# Patient Record
Sex: Female | Born: 1975 | Race: White | Hispanic: No | Marital: Single | Smoking: Former smoker
Health system: Southern US, Community
[De-identification: ages and names within clinical notes are randomized; demographics above are authoritative.]

## PROBLEM LIST (undated history)

## (undated) ENCOUNTER — Ambulatory Visit (HOSPITAL_COMMUNITY): Disposition: A | Discharge status: Home / Self Care | Payer: Medicaid Other

## (undated) DIAGNOSIS — J449 Chronic obstructive pulmonary disease, unspecified: Secondary | ICD-10-CM

## (undated) DIAGNOSIS — D509 Iron deficiency anemia, unspecified: Secondary | ICD-10-CM

## (undated) DIAGNOSIS — M3500 Sicca syndrome, unspecified: Secondary | ICD-10-CM

## (undated) DIAGNOSIS — G5600 Carpal tunnel syndrome, unspecified upper limb: Secondary | ICD-10-CM

## (undated) DIAGNOSIS — K649 Unspecified hemorrhoids: Secondary | ICD-10-CM

## (undated) DIAGNOSIS — D069 Carcinoma in situ of cervix, unspecified: Secondary | ICD-10-CM

## (undated) DIAGNOSIS — J45909 Unspecified asthma, uncomplicated: Secondary | ICD-10-CM

## (undated) HISTORY — PX: OTHER SURGICAL HISTORY: SHX169

## (undated) HISTORY — DX: Sjogren syndrome, unspecified: M35.00

## (undated) HISTORY — DX: Unspecified hemorrhoids: K64.9

## (undated) HISTORY — DX: Chronic obstructive pulmonary disease, unspecified: J44.9

## (undated) HISTORY — DX: Carpal tunnel syndrome, unspecified upper limb: G56.00

## (undated) HISTORY — DX: Iron deficiency anemia, unspecified: D50.9

---
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
## 1999-02-20 ENCOUNTER — Emergency Department (HOSPITAL_COMMUNITY): Admission: EM | Admit: 1999-02-20 | Discharge: 1999-02-20 | Payer: Self-pay | Admitting: Emergency Medicine

## 1999-08-30 ENCOUNTER — Encounter: Payer: Self-pay | Admitting: Emergency Medicine

## 1999-08-30 ENCOUNTER — Emergency Department (HOSPITAL_COMMUNITY): Admission: EM | Admit: 1999-08-30 | Discharge: 1999-08-30 | Payer: Self-pay | Admitting: Emergency Medicine

## 1999-09-14 ENCOUNTER — Encounter: Admission: RE | Admit: 1999-09-14 | Discharge: 1999-09-14 | Payer: Self-pay | Admitting: Family Medicine

## 1999-09-23 ENCOUNTER — Encounter: Admission: RE | Admit: 1999-09-23 | Discharge: 1999-09-23 | Payer: Self-pay | Admitting: Family Medicine

## 1999-09-26 ENCOUNTER — Ambulatory Visit (HOSPITAL_COMMUNITY): Admission: AD | Admit: 1999-09-26 | Discharge: 1999-09-26 | Payer: Self-pay | Admitting: Obstetrics & Gynecology

## 1999-09-26 ENCOUNTER — Encounter: Payer: Self-pay | Admitting: Obstetrics & Gynecology

## 1999-09-26 ENCOUNTER — Encounter (INDEPENDENT_AMBULATORY_CARE_PROVIDER_SITE_OTHER): Payer: Self-pay | Admitting: Specialist

## 2000-02-28 ENCOUNTER — Emergency Department (HOSPITAL_COMMUNITY): Admission: EM | Admit: 2000-02-28 | Discharge: 2000-02-28 | Payer: Self-pay | Admitting: Emergency Medicine

## 2000-05-26 ENCOUNTER — Inpatient Hospital Stay (HOSPITAL_COMMUNITY): Admission: AD | Admit: 2000-05-26 | Discharge: 2000-05-26 | Payer: Self-pay | Admitting: Obstetrics & Gynecology

## 2000-06-02 ENCOUNTER — Ambulatory Visit (HOSPITAL_COMMUNITY): Admission: RE | Admit: 2000-06-02 | Discharge: 2000-06-02 | Payer: Self-pay | Admitting: *Deleted

## 2000-07-05 ENCOUNTER — Encounter: Admission: RE | Admit: 2000-07-05 | Discharge: 2000-07-05 | Payer: Self-pay | Admitting: Obstetrics & Gynecology

## 2000-07-19 ENCOUNTER — Encounter: Admission: RE | Admit: 2000-07-19 | Discharge: 2000-07-19 | Payer: Self-pay | Admitting: Obstetrics & Gynecology

## 2000-08-02 ENCOUNTER — Encounter: Admission: RE | Admit: 2000-08-02 | Discharge: 2000-08-02 | Payer: Self-pay | Admitting: Obstetrics & Gynecology

## 2000-08-16 ENCOUNTER — Encounter: Admission: RE | Admit: 2000-08-16 | Discharge: 2000-08-16 | Payer: Self-pay | Admitting: Obstetrics & Gynecology

## 2000-08-30 ENCOUNTER — Encounter: Admission: RE | Admit: 2000-08-30 | Discharge: 2000-08-30 | Payer: Self-pay | Admitting: Obstetrics & Gynecology

## 2000-09-11 ENCOUNTER — Ambulatory Visit (HOSPITAL_COMMUNITY): Admission: RE | Admit: 2000-09-11 | Discharge: 2000-09-11 | Payer: Self-pay | Admitting: Obstetrics & Gynecology

## 2000-09-13 ENCOUNTER — Encounter: Admission: RE | Admit: 2000-09-13 | Discharge: 2000-09-13 | Payer: Self-pay | Admitting: Obstetrics & Gynecology

## 2000-09-27 ENCOUNTER — Encounter: Admission: RE | Admit: 2000-09-27 | Discharge: 2000-09-27 | Payer: Self-pay | Admitting: Obstetrics & Gynecology

## 2000-10-11 ENCOUNTER — Encounter: Admission: RE | Admit: 2000-10-11 | Discharge: 2000-10-11 | Payer: Self-pay | Admitting: Obstetrics & Gynecology

## 2000-10-11 ENCOUNTER — Inpatient Hospital Stay (HOSPITAL_COMMUNITY): Admission: AD | Admit: 2000-10-11 | Discharge: 2000-10-11 | Payer: Self-pay | Admitting: Obstetrics

## 2000-10-18 ENCOUNTER — Encounter: Admission: RE | Admit: 2000-10-18 | Discharge: 2000-10-18 | Payer: Self-pay | Admitting: Obstetrics & Gynecology

## 2000-10-25 ENCOUNTER — Encounter: Admission: RE | Admit: 2000-10-25 | Discharge: 2000-10-25 | Payer: Self-pay | Admitting: Obstetrics & Gynecology

## 2000-11-01 ENCOUNTER — Encounter: Admission: RE | Admit: 2000-11-01 | Discharge: 2000-11-01 | Payer: Self-pay | Admitting: Obstetrics & Gynecology

## 2000-11-15 ENCOUNTER — Encounter: Admission: RE | Admit: 2000-11-15 | Discharge: 2000-11-15 | Payer: Self-pay | Admitting: Obstetrics & Gynecology

## 2000-11-20 ENCOUNTER — Encounter (INDEPENDENT_AMBULATORY_CARE_PROVIDER_SITE_OTHER): Payer: Self-pay | Admitting: Specialist

## 2000-11-20 ENCOUNTER — Encounter (HOSPITAL_COMMUNITY): Admission: RE | Admit: 2000-11-20 | Discharge: 2000-11-21 | Payer: Self-pay | Admitting: Obstetrics & Gynecology

## 2000-11-20 ENCOUNTER — Inpatient Hospital Stay (HOSPITAL_COMMUNITY): Admission: AD | Admit: 2000-11-20 | Discharge: 2000-11-23 | Payer: Self-pay | Admitting: *Deleted

## 2001-01-16 ENCOUNTER — Emergency Department (HOSPITAL_COMMUNITY): Admission: EM | Admit: 2001-01-16 | Discharge: 2001-01-17 | Payer: Self-pay | Admitting: Emergency Medicine

## 2001-01-16 ENCOUNTER — Encounter: Payer: Self-pay | Admitting: Emergency Medicine

## 2001-04-14 ENCOUNTER — Emergency Department (HOSPITAL_COMMUNITY): Admission: EM | Admit: 2001-04-14 | Discharge: 2001-04-14 | Payer: Self-pay | Admitting: Emergency Medicine

## 2001-04-30 ENCOUNTER — Emergency Department (HOSPITAL_COMMUNITY): Admission: EM | Admit: 2001-04-30 | Discharge: 2001-04-30 | Payer: Self-pay | Admitting: Emergency Medicine

## 2002-03-06 ENCOUNTER — Emergency Department (HOSPITAL_COMMUNITY): Admission: EM | Admit: 2002-03-06 | Discharge: 2002-03-06 | Payer: Self-pay | Admitting: Emergency Medicine

## 2002-05-12 ENCOUNTER — Encounter (INDEPENDENT_AMBULATORY_CARE_PROVIDER_SITE_OTHER): Payer: Self-pay | Admitting: Specialist

## 2002-05-12 ENCOUNTER — Inpatient Hospital Stay (HOSPITAL_COMMUNITY): Admission: AD | Admit: 2002-05-12 | Discharge: 2002-05-14 | Payer: Self-pay | Admitting: *Deleted

## 2002-05-12 ENCOUNTER — Encounter: Payer: Self-pay | Admitting: *Deleted

## 2002-05-28 ENCOUNTER — Emergency Department (HOSPITAL_COMMUNITY): Admission: EM | Admit: 2002-05-28 | Discharge: 2002-05-28 | Payer: Self-pay | Admitting: Emergency Medicine

## 2004-02-16 ENCOUNTER — Inpatient Hospital Stay (HOSPITAL_COMMUNITY): Admission: AD | Admit: 2004-02-16 | Discharge: 2004-02-16 | Payer: Self-pay | Admitting: Obstetrics & Gynecology

## 2004-05-14 ENCOUNTER — Inpatient Hospital Stay (HOSPITAL_COMMUNITY): Admission: AD | Admit: 2004-05-14 | Discharge: 2004-05-16 | Payer: Self-pay | Admitting: *Deleted

## 2004-06-03 ENCOUNTER — Ambulatory Visit: Payer: Self-pay | Admitting: *Deleted

## 2004-06-03 ENCOUNTER — Encounter: Admission: RE | Admit: 2004-06-03 | Discharge: 2004-06-03 | Payer: Self-pay | Admitting: Family Medicine

## 2004-06-07 ENCOUNTER — Ambulatory Visit (HOSPITAL_COMMUNITY): Admission: RE | Admit: 2004-06-07 | Discharge: 2004-06-07 | Payer: Self-pay | Admitting: Family Medicine

## 2004-06-09 ENCOUNTER — Encounter: Admission: RE | Admit: 2004-06-09 | Discharge: 2004-06-09 | Payer: Self-pay | Admitting: *Deleted

## 2004-06-11 ENCOUNTER — Encounter: Admission: RE | Admit: 2004-06-11 | Discharge: 2004-06-11 | Payer: Self-pay | Admitting: *Deleted

## 2004-06-16 ENCOUNTER — Ambulatory Visit: Payer: Self-pay | Admitting: *Deleted

## 2004-06-16 ENCOUNTER — Encounter: Admission: RE | Admit: 2004-06-16 | Discharge: 2004-06-16 | Payer: Self-pay | Admitting: *Deleted

## 2004-06-16 ENCOUNTER — Inpatient Hospital Stay (HOSPITAL_COMMUNITY): Admission: RE | Admit: 2004-06-16 | Discharge: 2004-06-20 | Payer: Self-pay | Admitting: *Deleted

## 2004-06-17 ENCOUNTER — Encounter (INDEPENDENT_AMBULATORY_CARE_PROVIDER_SITE_OTHER): Payer: Self-pay | Admitting: Specialist

## 2004-06-24 ENCOUNTER — Inpatient Hospital Stay (HOSPITAL_COMMUNITY): Admission: AD | Admit: 2004-06-24 | Discharge: 2004-06-24 | Payer: Self-pay | Admitting: *Deleted

## 2004-10-12 ENCOUNTER — Emergency Department (HOSPITAL_COMMUNITY): Admission: EM | Admit: 2004-10-12 | Discharge: 2004-10-13 | Payer: Self-pay | Admitting: *Deleted

## 2008-05-01 ENCOUNTER — Emergency Department (HOSPITAL_COMMUNITY): Admission: EM | Admit: 2008-05-01 | Discharge: 2008-05-01 | Payer: Self-pay | Admitting: Emergency Medicine

## 2010-06-22 ENCOUNTER — Emergency Department (HOSPITAL_COMMUNITY): Admission: EM | Admit: 2010-06-22 | Discharge: 2010-06-23 | Payer: Self-pay | Admitting: Emergency Medicine

## 2010-08-08 ENCOUNTER — Ambulatory Visit: Payer: Self-pay | Admitting: Gynecology

## 2010-08-08 ENCOUNTER — Inpatient Hospital Stay (HOSPITAL_COMMUNITY): Admission: AD | Admit: 2010-08-08 | Discharge: 2010-08-08 | Payer: Self-pay | Admitting: Obstetrics and Gynecology

## 2010-11-07 ENCOUNTER — Encounter: Payer: Self-pay | Admitting: *Deleted

## 2010-12-29 LAB — URINE MICROSCOPIC-ADD ON

## 2010-12-29 LAB — GC/CHLAMYDIA PROBE AMP, GENITAL
Chlamydia, DNA Probe: NEGATIVE
GC Probe Amp, Genital: NEGATIVE

## 2010-12-29 LAB — URINALYSIS, ROUTINE W REFLEX MICROSCOPIC
Bilirubin Urine: NEGATIVE
Glucose, UA: NEGATIVE mg/dL
Ketones, ur: NEGATIVE mg/dL
Leukocytes, UA: NEGATIVE
Nitrite: NEGATIVE
Protein, ur: NEGATIVE mg/dL
Specific Gravity, Urine: 1.025 (ref 1.005–1.030)
Urobilinogen, UA: 1 mg/dL (ref 0.0–1.0)
pH: 5.5 (ref 5.0–8.0)

## 2010-12-29 LAB — HERPES SIMPLEX VIRUS CULTURE: Culture: NOT DETECTED

## 2010-12-29 LAB — WET PREP, GENITAL
Clue Cells Wet Prep HPF POC: NONE SEEN
Trich, Wet Prep: NONE SEEN
Yeast Wet Prep HPF POC: NONE SEEN

## 2010-12-29 LAB — POCT PREGNANCY, URINE: Preg Test, Ur: NEGATIVE

## 2011-03-04 NOTE — Discharge Summary (Signed)
[name], [name]                            ACCOUNT NO.:  [id number]   MEDICAL RECORD NO.:  [id number]                   PATIENT TYPE:  INP   LOCATION:  9118                                 [facility]ILITY[facility]   PHYSICIAN:  Conni Elliot, M.D.             DATE OF BIRTH:  1975/12/26   DATE OF ADMISSION:  06/16/2004  DATE OF DISCHARGE:  06/20/2004                                 DISCHARGE SUMMARY   ADMISSION DIAGNOSES:  Abnormal fetal Doppler's with scheduled cesarean  section.   DISCHARGE DIAGNOSES:  1.  Status post low transverse cesarean section with bilateral tubal      ligation with delivery of healthy female infant.   HISTORY OF PRESENT ILLNESS:  This is a 35 year old gravida 4, para 1, 1-1-1  who was admitted at 36 weeks and 3 days gestation by 19 week ultrasound for  a scheduled cesarean section due to abnormal Doppler's. 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   HOSPITAL COURSE:  She was admitted for scheduled cesarean section and  underwent a low transverse Cesarean section on June 17, 2004 without  significant complications with delivery of a healthy female infant with  Apgar's of 9 and 9 and a cord pH of 7.3. Infant's weight was 4 pound 5  ounces. Her postoperative course has been unremarkable. Her pain has been  adequately controlled with oral medications and she will not be requiring  contraception following her tubal ligation. She will be both breast and  bottle feeding. She is to return to have her staples removed on  postoperative 5 to 7 secondary to obesity.   DISPOSITION:  She is discharged to home on postoperative day 3 to followup  at the clinic.   PROCEDURE:  1.  Low transverse cesarean section.  2.  Bilateral tubal ligation, both done by Dr. Gavin Potters. For full details see      operative note.   LABORATORY DATA:  Postpartum hemoglobin is 9.8 with  hematocrit of 29 and a  platelet count of 115,000.   DISCHARGE INSTRUCTIONS:  1.  She is discharged home.  2.  She is to return to have her staples removed in 3 to 4 days.  3.  She is to eat a healthy diet.  4.  She is to be active as she is able.  5.  She is to care for her incision per instructions given to her at      discharge.   DISCHARGE MEDICATIONS:  1.  Percocet 1 to 2 tablets every 4 to 6 hours as needed.  2.  Ibuprofen q. 6 hours as needed.  3.  Prenatal vitamins 1 tablet daily while breast feeding.  4.  Iron sulfate 325 mg twice daily with meals.     Obstetrics Resident  Conni Elliot, M.D.    OR/MEDQ  D:  06/20/2004  T:  06/21/2004  Job:  [id number]

## 2011-03-04 NOTE — Discharge Summary (Signed)
[name], [name]                            ACCOUNT NO.:  [id number]   MEDICAL RECORD NO.:  [id number]                   PATIENT TYPE:  INP   LOCATION:  9140                                 [facility]   PHYSICIAN:  [name] DICTATOR                    DATE OF BIRTH:  [birth year]   DATE OF ADMISSION:  05/12/2002  DATE OF DISCHARGE:  05/14/2002                                 DISCHARGE SUMMARY   ADMISSION DIAGNOSIS:  A 35 year old G3, P1-0-1-1 at 31-1/7 weeks with  abdominal pain and no fetal movement.   DISCHARGE DIAGNOSIS:  A 35 year old G3, P1-1-1-1, status post intrauterine  fetal death of undetermined cause, status post vaginal delivery.   HISTORY:  The patient is a 35 year old G3, P1 at 31-0/7 weeks by a 23-week  ultrasound who reported having increasing abdominal pain and decreasing  fetal movement for five days.  The patient's pain became acutely worse that  morning and presented to MAU and was found to have an IUFD by ultrasound and  a tender uterus.   PAST OBSTETRIC HISTORY:  Significant for a spontaneous abortion at 14 weeks  followed by a term delivery of a female infant by C-section for a breech  position.  At this previous delivery, she was noted to have a low platelet  count with what appears to be a negative workup which included ANA, APLA,  and von Willebrand.  She had the diagnosis of idiopathic/gestational  thrombocytopenia.  This patient had no other prenatal care for this visit  with the exception of an ultrasound earlier in the month at the pregnancy  crisis center.   HOSPITAL COURSE:  The patient was admitted for induction of labor with low-  dose Pitocin.  She received an epidural for anesthesia.  She received triple  antibiotics for elevated white blood count and temperature and a tender  uterus.  The patient initially had elevated blood pressures at admission,  which then went to normal shortly after admission and stayed normal.   On May 13, 2002,  the patient delivered without assistance into the bed a  nonviable female infant with Apgar's of 0 at 1 minute and 0 at 9 minutes.  Placenta was delivered spontaneously intact with a three-vessel cord.  Cultures of the placenta were taken and infant went to pathology for  autopsy.  There were no external defects noted.  The patient was inspected  and no lacerations were noted.   The patient's platelet count at admission was 100.  On the 24-hour  postpartum CBC, her platelet count was 68.  Twelve hours later, it was 72.   Urine drug screen was negative.  Cultures taken prior to antibiotic:  GBS  negative, GC and Chlamydia negative.  Wet prep within normal limits.  At  discharge, a cardiolipin antibody, antinuclear antibody, antiplatelet  antibody are all pending, as well a rubella  screen is pending.   At discharge, the patient was feeling well.  There were no signs of bleeding  with the exception of minimal lochia.  She appeared to be appropriate in  dealing with the loss of this infant and was making preparations for a  funeral to be the next day.   The patient was given Depo-Provera 500 mg IM prior to discharge.   DISCHARGE INSTRUCTIONS:  The patient is discharged to home with a  prescription for ibuprofen 600 mg one every six hours.  She has prenatal  vitamins and was instructed to continue taking one for six weeks.  She is to  follow up with [name] on June 11, 2002 at 10 o'clock a.m.  She is to  follow up at [facility] in six weeks for routine postpartum check.   DIET/ACTIVITY/SEXUAL ACTIVITY:  Per routine.                                                  [name] DICTATOR    DD/MEDQ  D:  05/14/2002  T:  05/18/2002  Job:  [id number]

## 2011-03-04 NOTE — Discharge Summary (Signed)
NAMELIZA, CZERWINSKI                            ACCOUNT NO.:  [id number]   MEDICAL RECORD NO.:  [id number]                   PATIENT TYPE:  INP   LOCATION:  9158                                 [facility]   PHYSICIAN:  Conni Elliot, M.D.             DATE OF BIRTH:  Oct 05, 1976   DATE OF ADMISSION:  05/14/2004  DATE OF DISCHARGE:  05/16/2004                                 DISCHARGE SUMMARY   ADMISSION DIAGNOSES:  46. 35 year old G3,P1,1,1,1 at 31 weeks 6 days.  2. Threatened preterm labor.   DISCHARGE DIAGNOSES:  10. 35 year old G3,P1,1,1,1 at 32 weeks 1 day.  2. Cessation of preterm labor.   DISCHARGE MEDICATIONS:  1. Terbutaline 2.5 mg p.o. q.4h  2. ASA 81 mg daily.  3. Prenatal vitamins one tablet daily.  4. Colace 100 mg b.i.d.   STUDIES:  OB ultrasound:  Single fetus, 147 beats/minute, breech  presentation, no placenta previa, AFI 15.4, cervix 3.4 translabially.   ADMISSION HISTORY:  Ms. Brunetta Genera is a 35 year old G3,P1,1,1,1 that presented at  31 weeks, 6 days to the MAU complaining of abdominal pain for one day that  began that morning.  Questionable leakage of fluid versus urine for two  days.   HOSPITAL COURSE:  Patient was seen in MAU for evaluation.  Patient's vital  signs on admission to MAU:  Temperature 97.8, pulse 80, respiration rate 20,  blood pressure 129/77.  Speculum exam:  White discharge.  No pooling.  Nitrazine negative.  Digital cervical exam:  Fingertip:  Soft and high with  developed lower uterine segment.  Patient was put on monitor and FHTs were  120-130. Toco revealed uterine contraction every 3-4 minutes, but reduced to  uterine irritability. The patient was admitted to floor for observation and  studies due to patient's significant history of spontaneous abortion.  The  did fine on unit.  Continued having uterine irritability and was placed on  Terbutaline 2.5 mg p.o. q.4h which resolved contractions and irritability.  Fetal heart tones  remained reassuring.  The patient was evaluated for lupus  anticoagulant, antinuclear antibody cardiolipin antigens which are all  pending.  The patient will be discharge on Terbutaline p.o. 2.5 mg q.4h and  ASA 81 mg p.o. daily until pending results are finalized.  The patient will  also be discharged on Colace 100 mg b.i.d. and a prenatal vitamin.  The  patient's cervix was unchanged at discharge.   CONDITION ON DISCHARGE:  Stable.   INSTRUCTIONS GIVEN TO PATIENT:  The patient will remain on bed rest while at  home.  She understands her medication protocol.  [facility] bu she will also be instructed  to call the high risk clinic to set an appointment for this Thursday or  Wednesday.     Bonnita Hollow, M.D.  Conni Elliot, M.D.    VRE/MEDQ  D:  05/16/2004  T:  05/16/2004  Job:  [id number]

## 2011-03-04 NOTE — Op Note (Signed)
[facility]  Patient:    [name], [name]                         MRN: [id number] Proc. Date: 11/20/00 Adm. Date:  [id number] Disc. Date: [id number] Attending:  Antionette Char                           Operative Report  PREOPERATIVE DIAGNOSIS:       Term pregnancy, breech presentation.  POSTOPERATIVE DIAGNOSIS:      Term pregnancy, breech presentation.  PROCEDURE:                    Primary low transverse cesarean section.  SURGEON:                      Charles A. Clearance Coots, M.D.  ASSISTANT:                    Jamey Reas, M.D.                               Carrie Thornon.  ANESTHESIA:                   Spinal.  ESTIMATED BLOOD LOSS:         800 ml.  IV FLUIDS:                    1600 ml.  URINE OUTPUT:                 200 ml clear.  COMPLICATIONS:                None.  DRAINS:                       Foley to gravity.  FINDINGS:                     Viable female at 49 with Apgars of 8 at one minute and 9 at five minutes, weight of 7 pounds 1 ounce. Normal uterus, ovaries, and fallopian tubes.  DESCRIPTION OF PROCEDURE:     The patient was brought to the operating room. After satisfactory spinal anesthesia, the abdomen was prepped and draped in the usual sterile fashion. A Pfannenstiel skin incision was made with the scalpel that was deepened down to the fascia with scalpel. The fascia was nicked in the midline and the fascial incision was extended to the left and to the right with curved Mayo scissors. The superior and inferior fascial edges were taken off of the rectus muscle, both blunt and sharp dissection. The rectus muscle was then divided in the midline superiorly and inferiorly, being careful to avoid the urinary bladder. The peritoneum was then entered digitally and was digitally extended to the left and to the right. The bladder blade was positioned and the vesicouterine fold of peritoneum above the reflection of the urinary  bladder was grasped with forceps and was incised and undermined with Metzenbaum scissors. The incision was extended to the left and to the right with the Metzenbaum scissors. The bladder flap was then bluntly developed and the bladder blade was repositioned in front of the urinary bladder, placing it well out of the operative field. The uterus was then entered with short strokes of  the scalpel down to the amniotic sac. The uterine incision was then extended to the left and to the right with the bandage scissors. The amniotic sac was ruptured and a moderate amount of clear fluid was expelled. The delivery was then accomplished as a frank breech delivery in routine fashion without complications. The infants mouth and nose were suctioned with a suction bulb and the umbilical cord was doubly clamped and cut and the infant was handed off to the nursery staff. Placental cord blood was obtained and the placenta was spontaneously expelled from the uterine cavity intact. The uterus was exteriorized and the edges of the uterine incision were grasped with a ring forceps. The endometrial surface was thoroughly debrided with a dry lap sponge. The uterus was closed with a continuous interlocking suture of 0 Monocryl from each corner to the center. Hemostasis was excellent. The uterus was placed back in its normal anatomic position and the pelvic cavity was thoroughly irrigated with warm saline solution and all clots were removed. The abdomen was then closed as follows: The fascia was closed with a continuous suture of 0 PDS from each corner to the center. The subcutaneous tissue was thoroughly irrigated with warm saline solution and all areas of subcutaneous bleeding were coagulated with the Bovie. The skin was then approximated with surgical stainless steel staples. A sterile bandage was applied to the incision closure. The surgical technician indicated that all needle, sponge, and instrument counts were  correct. The patient tolerated the procedure well and was transported to recovery room ins satisfactory condition. DD:  11/20/00 TD:  11/21/00 Job: [id number]

## 2011-03-04 NOTE — Op Note (Signed)
NAMEDAN, DISSINGER                            ACCOUNT NO.:  [id number]   MEDICAL RECORD NO.:  [id number]                   PATIENT TYPE:  INP   LOCATION:  9118                                 [facility]   PHYSICIAN:  Conni Elliot, M.D.             DATE OF BIRTH:  [birth year]   DATE OF PROCEDURE:  06/17/2004  DATE OF DISCHARGE:                                 OPERATIVE REPORT   PREOPERATIVE DIAGNOSES:  1.  Thirty-six and three-sevenths intrauterine pregnancy.  2.  Abnormal fetal Dopplers.  3.  Anticardiolipin disorder.  4.  Breech presentation.  5.  Desired sterilization.   POSTOPERATIVE DIAGNOSES:  1.  Thirty-six and three-sevenths intrauterine pregnancy.  2.  Abnormal fetal Dopplers.  3.  Anticardiolipin disorder.  4.  Breech presentation.  5.  Desired sterilization.  6.  Preperitoneal omental adhesions.   PROCEDURE:  Lower transverse C-section, bilateral tubal ligation.   SURGEON:  Dr. Corky Sox.   ASSISTANT:  Dr. Misty Stanley Cassidy-Vu.   ESTIMATED BLOOD LOSS:  800 mL   INTRAVENOUS FLUIDS:  3000 mL   URINE OUTPUT:  525 mL   INDICATIONS:  Ms. Brunetta Genera is a 35 year old G4 P1-1-1-1 at 76 and 3 weeks with  positive anticardiolipin antibody, positive lupus anticoagulant, with a  small-for-gestational-age baby with abnormal middle cerebral artery and  abnormal umbilical artery Dopplers.   FINDINGS:  Viable female infant with Apgars 9 at one minute and 9 at five  minutes.  She was in the breech presentation with sacrum anterior.  There  was clear amniotic fluid.  Weight was 4 pounds 5 ounces.  Cord pH 7.3.  Normal uterus, tubes, and ovaries.   DESCRIPTION OF PROCEDURE:  The patient was taken to the operating room where  spinal anesthesia was found to be adequate.  She was prepped and draped in a  normal sterile fashion in the dorsal supine position.  A Pfannenstiel skin  incision was made with the scalpel and carried through to the underlying  layer of fascia.  The  fascia was incised in the midline and the incision was  extended laterally with Mayo scissors.  The superior aspect of the fascial  incision was grasped with Kochers and elevated.  At that point preperitoneal  omental adhesions were noted and tied off and cut.  The underlying rectus  muscles were dissected off.  Attention was turned to the inferior aspect of  the incision with the rectus muscles dissected off bluntly.  The rectus  muscles were separated in the midline and the peritoneum was identified,  tented up, and entered with Metzenbaum scissors.  The peritoneal incision  was extended superiorly and inferiorly with good visualization of the  bladder.  The bladder blade was inserted and the vesicouterine peritoneum  was identified, grasped with pickups, and entered sharply with Metzenbaum  scissors.  The incision was extended laterally and the bladder flap was  created  digitally.   The bladder blade was reinserted and the lower uterine segment was incised  in a transverse fashion with the scalpel.  The incision was extended  laterally manually.  The bladder blade was removed and the infant's breech  was delivered atraumatically.  The remainder of the body was delivered  atraumatically and the nose and mouth were bulb suctioned.  The cord was  clamped and cut.  The infant was handed off to the waiting NICU team in  attendance.  Cord gases and cord blood were sent.   The placenta was removed manually, the uterus exteriorized and cleared of  all clots and debris.  The uterine incision was repaired with chromic in a  running locked fashion.  A second layer of the same suture was used to  obtain hemostasis.  The bladder flap was repaired in a running stitch.   Attention was then turned to the tubes and ovaries.  The right fallopian  tube was identified and grasped with a Babcock clamp.  The tube was ligated  with suture and excised.  Good hemostasis was noted.  The fimbria were  easily  noted since the uterus was still exteriorized.  The left fallopian  tube was done in the same manner.  The remainder of the tubes and uterus  were returned to the abdomen.  The gutters were cleared of all clots.  The  perineum was closed in a running fashion with Vicryl.  The fascia was  reapproximated with Vicryl in a running fashion.  The subcuticular layer was  closed in a running fashion.  The skin was closed with staples.   The patient tolerated the procedure well.  Sponge, lap, and needle counts  were correct.  Ancef 2 g was given prior to the procedure.  The patient was  taken to the recovery room in stable condition.     Jon Gills, M.D.                     Conni Elliot, M.D.    LC/MEDQ  D:  06/17/2004  T:  06/18/2004  Job:  [id number]

## 2011-03-04 NOTE — Op Note (Signed)
NAME:  [name], [name]                            ACCOUNT NO.:  [id number]   MEDICAL RECORD NO.:  [id number]                   PATIENT TYPE:  WOC   LOCATION:  WOC                                  FACILITY:  [facility]   PHYSICIAN:  Conni Elliot, M.D.             DATE OF BIRTH:  [birth year]   DATE OF PROCEDURE:  DATE OF DISCHARGE:                                 OPERATIVE REPORT   ADDENDUM:  The following items were sent to pathology:  1.  Placenta.  2.  Right and left parts of the fallopian tubes.     Jon Gills, M.D.                     Conni Elliot, M.D.    LC/MEDQ  D:  06/17/2004  T:  06/18/2004  Job:  [id number]

## 2011-03-04 NOTE — Discharge Summary (Signed)
NAMECRYSTINA, BORRAYO                            ACCOUNT NO.:  [id number]   MEDICAL RECORD NO.:  [id number]                   PATIENT TYPE:  INP   LOCATION:  9158                                 [facility]   PHYSICIAN:  Conni Elliot, M.D.             DATE OF BIRTH:  May 23, 1976   DATE OF ADMISSION:  05/14/2004  DATE OF DISCHARGE:  05/16/2004                                 DISCHARGE SUMMARY   ADDENDUM:   STUDIES:  BAT positive, lupus anticoagulant pending, antinuclear antigen,  cardiolipin antibody pending.     Bonnita Hollow, M.D.               Conni Elliot, M.D.    VRE/MEDQ  D:  05/16/2004  T:  05/16/2004  Job:  [id number]

## 2011-03-04 NOTE — Discharge Summary (Signed)
[facility]  Patient:    [name], [name]                         MRN: [id number] Adm. Date:  [id number] Disc. Date: [id number] Attending:  Michaelle Copas Dictator:   Pricilla Holm, M.D.                           Discharge Summary  HISTORY OF PRESENT ILLNESS:   The patient is a 35 year old, G2, P 1-0-1-1, who presented at 28 and [redacted] weeks gestation with breech presentation.  The patient declined inversion and desired cesarean section.  HOSPITAL COURSE:              On November 20, 2000, the patient underwent a primary low transverse cesarean section under spinal anesthesia.  The patient was found to have viable female born at 63 with Apgars of 8 at one minute and 9 at five minutes and weighing 7 pounds and 1 ounce.  The patient was found to have a normal uterus, ovaries, and tubes during the procedure.  The patient tolerated the procedure well and was taken to the recovery room in satisfactory condition.  The patients postoperative care has been unremarkable.  She has received routine postop care and has done quite well, tolerating p.o.s and having bowel movements.  She will be bottle feeding her child and desires oral contraceptives.  DISCHARGE CONDITION:          Good.  DISPOSITION:                  Discharged to home.  DISCHARGE MEDICATIONS:        1. Ibuprofen 600 mg one tab p.o. q.6h. p.r.n.                                  pain.                               2. Percocet one tab p.o. q.6h. p.r.n. pain.                               3. Ortho Tri-Cyclen one pill daily as instructed                                  on package starting Sunday, February 17.  DISCHARGE INSTRUCTIONS:       The patient was given specific activity, diet, and wound care instructions.  She was also given specific instructions of signs and symptoms to warrant further treatment.  DISCHARGE FOLLOWUP:           The patient will follow up in six weeks [facility]  for her postoperative care.  Staples will be removed prior to discharge.  The patient had good understanding of the above discharge plan and had no further questions. DD:  11/23/00 TD:  11/24/00 Job: [id number]

## 2011-07-15 LAB — DIFFERENTIAL
Eosinophils Absolute: 0.1
Eosinophils Relative: 1
Lymphs Abs: 1.4

## 2011-07-15 LAB — COMPREHENSIVE METABOLIC PANEL
ALT: 28
AST: 24
CO2: 27
Calcium: 9
Chloride: 107
GFR calc Af Amer: >60
GFR calc non Af Amer: >60
Potassium: 3.6
Sodium: 139
Total Bilirubin: 0.8

## 2011-07-15 LAB — CBC
RBC: 5.17 — ABNORMAL HIGH
WBC: 8.6

## 2011-07-15 LAB — URINALYSIS, ROUTINE W REFLEX MICROSCOPIC
Ketones, ur: 15 — AB
Nitrite: POSITIVE — AB
pH: 5.5

## 2011-07-15 LAB — LIPASE, BLOOD: Lipase: 25

## 2011-07-15 LAB — URINE MICROSCOPIC-ADD ON

## 2012-04-17 ENCOUNTER — Emergency Department (HOSPITAL_COMMUNITY)
Admission: EM | Admit: 2012-04-17 | Discharge: 2012-04-17 | Disposition: A | Discharge status: Home / Self Care | Payer: Medicaid Other | Attending: Emergency Medicine | Admitting: Emergency Medicine

## 2012-04-17 ENCOUNTER — Encounter (HOSPITAL_COMMUNITY): Payer: Self-pay | Admitting: *Deleted

## 2012-04-17 ENCOUNTER — Emergency Department (HOSPITAL_COMMUNITY): Payer: Medicaid Other

## 2012-04-17 DIAGNOSIS — M25539 Pain in unspecified wrist: Secondary | ICD-10-CM | POA: Insufficient documentation

## 2012-04-17 DIAGNOSIS — F17200 Nicotine dependence, unspecified, uncomplicated: Secondary | ICD-10-CM | POA: Insufficient documentation

## 2012-04-17 DIAGNOSIS — S63501A Unspecified sprain of right wrist, initial encounter: Secondary | ICD-10-CM

## 2012-04-17 HISTORY — DX: Unspecified asthma, uncomplicated: J45.909

## 2012-04-17 NOTE — ED Notes (Signed)
She twisted her rt wrist 2 days ago and the wrist is still painful

## 2012-04-17 NOTE — ED Provider Notes (Signed)
History     CSN: [id number]  Arrival date & time 04/17/12  2046   First MD Initiated Contact with Patient 04/17/12 2241      Chief Complaint  Patient presents with  . Wrist Pain    (Consider location/radiation/quality/duration/timing/severity/associated sxs/prior treatment) HPI  36 year old female presents complaining of right wrist pain. Patient states she accidentally twisted her right wrist while lifting an object 2 days ago. She felt a pop and noted immediate pain. Pain is described as a sharp and throbbing sensation radiates down to the hand and up to the forearm. Pain worsened with wrist movement. She denies finger pain or elbow pain. Denies numbness or rash.  She has tried using a wrist brace at home but has not tried anything else to alleviate her symptoms.    Past Medical History  Diagnosis Date  . Asthma     History reviewed. No pertinent past surgical history.  No family history on file.  History  Substance Use Topics  . Smoking status: Current Everyday Smoker  . Smokeless tobacco: Not on file  . Alcohol Use: Yes    OB History    Grav Para Term Preterm Abortions TAB SAB Ect Mult Living                  Review of Systems  Constitutional: Negative for fever and chills.  Musculoskeletal: Negative for myalgias and joint swelling.  Skin: Negative for rash.  Neurological: Negative for numbness.  All other systems reviewed and are negative.    Allergies  Review of patient's allergies indicates no known allergies.  Home Medications   Current Outpatient Rx  Name Route Sig Dispense Refill  . IBUPROFEN 200 MG PO TABS Oral Take 800 mg by mouth every 6 (six) hours as needed. For pain      BP 129/81  Pulse 86  Temp 98.4 F (36.9 C) (Oral)  Resp 18  SpO2 99%  LMP 04/10/2012  Physical Exam  Nursing note and vitals reviewed. Constitutional: She appears well-developed and well-nourished. No distress.  HENT:  Head: Atraumatic.  Eyes: Conjunctivae are  normal.  Neck: Neck supple.  Musculoskeletal:       Right elbow: Normal.      Right wrist: She exhibits tenderness. She exhibits normal range of motion, no bony tenderness, no swelling, no effusion, no crepitus, no deformity and no laceration.       Arms:      Right hand: Normal.  Neurological: She is alert.  Skin: Skin is warm. No rash noted.    ED Course  Procedures (including critical care time)  Labs Reviewed - No data to display Dg Wrist Complete Right  04/17/2012  *RADIOLOGY REPORT*  Clinical Data: Right wrist pain status post twisting injury.  RIGHT WRIST - COMPLETE 3+ VIEW  Comparison: None.  Findings: No fracture or dislocation identified.  No aggressive osseous lesion.  IMPRESSION: No acute osseous abnormality identified. If clinical concern for a fracture persists, recommend a repeat radiograph in 5-10 days to evaluate for interval change or callus formation.  Original Report Authenticated By: Waneta Martins, M.D.      MDM  R wrist injury.  Xray negative.  Wrist brace given along with RICE instruction.  F/u referral given.  Pt voice understanding.          Fayrene Helper, PA-C 04/17/12 2308

## 2012-04-17 NOTE — Progress Notes (Signed)
Orthopedic Tech Progress Note Patient Details:  [name] [facility] [birth year] [id number]  Ortho Devices Type of Ortho Device: Velcro wrist splint Ortho Device/Splint Location: right wrist Ortho Device/Splint Interventions: Application   [name] 04/17/2012, 11:01 PM

## 2012-04-17 NOTE — ED Notes (Signed)
Unable to locate patient x30 min 

## 2012-04-17 NOTE — ED Notes (Signed)
Unable to locate patient at this time.

## 2012-04-18 NOTE — ED Provider Notes (Signed)
Medical screening examination/treatment/procedure(s) were performed by non-physician practitioner and as supervising physician I was immediately available for consultation/collaboration.  Juliet Rude. Rubin Payor, MD 04/18/12 1610

## 2012-04-21 ENCOUNTER — Emergency Department (HOSPITAL_COMMUNITY): Payer: No Typology Code available for payment source

## 2012-04-21 ENCOUNTER — Emergency Department (HOSPITAL_COMMUNITY)
Admission: EM | Admit: 2012-04-21 | Discharge: 2012-04-21 | Disposition: A | Discharge status: Home / Self Care | Payer: No Typology Code available for payment source | Attending: Emergency Medicine | Admitting: Emergency Medicine

## 2012-04-21 ENCOUNTER — Encounter (HOSPITAL_COMMUNITY): Payer: Self-pay | Admitting: Emergency Medicine

## 2012-04-21 DIAGNOSIS — J45909 Unspecified asthma, uncomplicated: Secondary | ICD-10-CM | POA: Insufficient documentation

## 2012-04-21 DIAGNOSIS — S139XXA Sprain of joints and ligaments of unspecified parts of neck, initial encounter: Secondary | ICD-10-CM | POA: Insufficient documentation

## 2012-04-21 DIAGNOSIS — M25539 Pain in unspecified wrist: Secondary | ICD-10-CM | POA: Insufficient documentation

## 2012-04-21 DIAGNOSIS — IMO0002 Reserved for concepts with insufficient information to code with codable children: Secondary | ICD-10-CM | POA: Insufficient documentation

## 2012-04-21 DIAGNOSIS — S46912A Strain of unspecified muscle, fascia and tendon at shoulder and upper arm level, left arm, initial encounter: Secondary | ICD-10-CM

## 2012-04-21 DIAGNOSIS — IMO0001 Reserved for inherently not codable concepts without codable children: Secondary | ICD-10-CM | POA: Insufficient documentation

## 2012-04-21 DIAGNOSIS — M25519 Pain in unspecified shoulder: Secondary | ICD-10-CM | POA: Insufficient documentation

## 2012-04-21 DIAGNOSIS — Y9289 Other specified places as the place of occurrence of the external cause: Secondary | ICD-10-CM | POA: Insufficient documentation

## 2012-04-21 DIAGNOSIS — M542 Cervicalgia: Secondary | ICD-10-CM | POA: Insufficient documentation

## 2012-04-21 DIAGNOSIS — S161XXA Strain of muscle, fascia and tendon at neck level, initial encounter: Secondary | ICD-10-CM

## 2012-04-21 MED ORDER — HYDROCODONE-ACETAMINOPHEN 5-325 MG PO TABS: 1.0000 | ORAL_TABLET | ORAL | Status: AC | PRN

## 2012-04-21 MED ORDER — HYDROCODONE-ACETAMINOPHEN 5-325 MG PO TABS
1.0000 | ORAL_TABLET | Freq: Once | ORAL | Status: AC
  Administered 2012-04-21: 1 via ORAL
  Filled 2012-04-21: qty 1
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
## 2012-04-21 NOTE — ED Notes (Signed)
Ortho tech notified.  

## 2012-04-22 NOTE — ED Provider Notes (Signed)
Medical screening examination/treatment/procedure(s) were performed by non-physician practitioner and as supervising physician I was immediately available for consultation/collaboration.  Taneil Lazarus M Hosea Hanawalt, MD 04/22/12 1240 

## 2012-11-16 ENCOUNTER — Emergency Department (HOSPITAL_COMMUNITY)
Admission: EM | Admit: 2012-11-16 | Discharge: 2012-11-16 | Disposition: A | Discharge status: Home / Self Care | Payer: Medicaid Other | Source: Home / Self Care | Attending: Family Medicine | Admitting: Family Medicine

## 2012-11-16 ENCOUNTER — Encounter (HOSPITAL_COMMUNITY): Payer: Self-pay

## 2012-11-16 DIAGNOSIS — G43909 Migraine, unspecified, not intractable, without status migrainosus: Secondary | ICD-10-CM

## 2012-11-16 DIAGNOSIS — M545 Low back pain, unspecified: Secondary | ICD-10-CM

## 2012-11-16 LAB — POCT PREGNANCY, URINE: Preg Test, Ur: NEGATIVE

## 2012-11-16 LAB — POCT URINALYSIS DIP (DEVICE)
Bilirubin Urine: NEGATIVE
Glucose, UA: NEGATIVE mg/dL
Ketones, ur: NEGATIVE mg/dL
Leukocytes, UA: NEGATIVE
pH: 7 (ref 5.0–8.0)

## 2012-11-16 MED ORDER — ONDANSETRON 4 MG PO TBDP: 4.0000 mg | ORAL_TABLET | Freq: Three times a day (TID) | ORAL | Status: DC | PRN

## 2012-11-16 MED ORDER — CYCLOBENZAPRINE HCL 10 MG PO TABS: 10.0000 mg | ORAL_TABLET | Freq: Two times a day (BID) | ORAL | Status: DC | PRN

## 2012-11-16 MED ORDER — NAPROXEN 500 MG PO TABS: 500.0000 mg | ORAL_TABLET | Freq: Two times a day (BID) | ORAL | Status: DC

## 2012-11-16 MED ORDER — TRAMADOL HCL 50 MG PO TABS: 50.0000 mg | ORAL_TABLET | Freq: Four times a day (QID) | ORAL | Status: DC | PRN

## 2012-11-16 MED ORDER — BECLOMETHASONE DIPROPIONATE 80 MCG/ACT IN AERS: 1.0000 | INHALATION_SPRAY | Freq: Two times a day (BID) | RESPIRATORY_TRACT | Status: DC

## 2012-11-16 MED ORDER — ALBUTEROL SULFATE HFA 108 (90 BASE) MCG/ACT IN AERS: 2.0000 | INHALATION_SPRAY | Freq: Four times a day (QID) | RESPIRATORY_TRACT | Status: DC | PRN

## 2012-11-16 NOTE — ED Notes (Signed)
Pain in back, HA for some time
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
## 2013-02-23 ENCOUNTER — Encounter (HOSPITAL_COMMUNITY): Payer: Self-pay | Admitting: Emergency Medicine

## 2013-02-23 ENCOUNTER — Emergency Department (HOSPITAL_COMMUNITY)
Admission: EM | Admit: 2013-02-23 | Discharge: 2013-02-23 | Disposition: A | Discharge status: Home / Self Care | Payer: Medicaid Other | Attending: Emergency Medicine | Admitting: Emergency Medicine

## 2013-02-23 DIAGNOSIS — Z79899 Other long term (current) drug therapy: Secondary | ICD-10-CM | POA: Insufficient documentation

## 2013-02-23 DIAGNOSIS — S335XXA Sprain of ligaments of lumbar spine, initial encounter: Secondary | ICD-10-CM | POA: Insufficient documentation

## 2013-02-23 DIAGNOSIS — W19XXXA Unspecified fall, initial encounter: Secondary | ICD-10-CM | POA: Insufficient documentation

## 2013-02-23 DIAGNOSIS — Y929 Unspecified place or not applicable: Secondary | ICD-10-CM | POA: Insufficient documentation

## 2013-02-23 DIAGNOSIS — S39012A Strain of muscle, fascia and tendon of lower back, initial encounter: Secondary | ICD-10-CM

## 2013-02-23 DIAGNOSIS — Z760 Encounter for issue of repeat prescription: Secondary | ICD-10-CM

## 2013-02-23 DIAGNOSIS — Y9389 Activity, other specified: Secondary | ICD-10-CM | POA: Insufficient documentation

## 2013-02-23 DIAGNOSIS — J45909 Unspecified asthma, uncomplicated: Secondary | ICD-10-CM | POA: Insufficient documentation

## 2013-02-23 DIAGNOSIS — F17200 Nicotine dependence, unspecified, uncomplicated: Secondary | ICD-10-CM | POA: Insufficient documentation

## 2013-02-23 DIAGNOSIS — M62838 Other muscle spasm: Secondary | ICD-10-CM | POA: Insufficient documentation

## 2013-02-23 DIAGNOSIS — IMO0002 Reserved for concepts with insufficient information to code with codable children: Secondary | ICD-10-CM | POA: Insufficient documentation

## 2013-02-23 MED ORDER — BECLOMETHASONE DIPROPIONATE 80 MCG/ACT IN AERS: 1.0000 | INHALATION_SPRAY | RESPIRATORY_TRACT | Status: DC | PRN

## 2013-02-23 MED ORDER — METHOCARBAMOL 500 MG PO TABS: 500.0000 mg | ORAL_TABLET | Freq: Two times a day (BID) | ORAL | Status: DC

## 2013-02-23 MED ORDER — ALBUTEROL SULFATE HFA 108 (90 BASE) MCG/ACT IN AERS: 1.0000 | INHALATION_SPRAY | Freq: Four times a day (QID) | RESPIRATORY_TRACT | Status: DC | PRN

## 2013-02-23 MED ORDER — TIOTROPIUM BROMIDE MONOHYDRATE 18 MCG IN CAPS: 18.0000 ug | ORAL_CAPSULE | Freq: Every day | RESPIRATORY_TRACT | Status: DC

## 2013-02-23 NOTE — ED Notes (Signed)
Medication refill

## 2013-02-23 NOTE — ED Provider Notes (Signed)
Medical screening examination/treatment/procedure(s) were performed by non-physician practitioner and as supervising physician I was immediately available for consultation/collaboration.   Marshae Azam, MD 02/23/13 1937 

## 2013-02-23 NOTE — ED Provider Notes (Signed)
History     CSN: [id number]  Arrival date & time 02/23/13  1646   First MD Initiated Contact with Patient 02/23/13 1650      No chief complaint on file.   (Consider location/radiation/quality/duration/timing/severity/associated sxs/prior treatment) HPI Comments: 37 year old female with a past medical history of asthma presents emergency department requesting a medication refill on albuterol, Qvar and Spiriva. Patient states she has been out of albuterol for one month, however has not needed it. She only takes Qvar over the summer when her seasonal allergies act up. The last time she received inhalers there are prescribed by the emergency department. Denies wheezing or shortness of breath. She does not have a primary care physician at this time. Also complaining of left-sided lower back pain after falling 3-4 weeks ago. Describes the pain as an ache, rated 4/10, worse with twisting movements. Denies pain, numbness or tingling down her extremities. No loss of control bowels or bladder or saddle anesthesia. States it feels as if she pulled her muscles.  The history is provided by the patient.    Past Medical History  Diagnosis Date  . Asthma     No past surgical history on file.  No family history on file.  History  Substance Use Topics  . Smoking status: Current Every Day Smoker  . Smokeless tobacco: Not on file  . Alcohol Use: Yes    OB History   Grav Para Term Preterm Abortions TAB SAB Ect Mult Living                  Review of Systems  Constitutional: Negative for fever and chills.  Respiratory: Negative for cough, shortness of breath and wheezing.   Cardiovascular: Negative for chest pain.  Musculoskeletal: Positive for back pain.  Neurological: Negative for numbness.  All other systems reviewed and are negative.    Allergies  Review of patient's allergies indicates no known allergies.  Home Medications   Current Outpatient Rx  Name  Route  Sig  Dispense   Refill  . albuterol (PROVENTIL HFA;VENTOLIN HFA) 108 (90 BASE) MCG/ACT inhaler   Inhalation   Inhale 2 puffs into the lungs every 6 (six) hours as needed. Shortness of breath   1 Inhaler   0   . albuterol (PROVENTIL HFA;VENTOLIN HFA) 108 (90 BASE) MCG/ACT inhaler   Inhalation   Inhale 1-2 puffs into the lungs every 6 (six) hours as needed for wheezing.   1 Inhaler   0   . Aspirin-Acetaminophen-Caffeine (GOODY HEADACHE PO)   Oral   Take 1 Package by mouth. Pain         . beclomethasone (QVAR) 80 MCG/ACT inhaler   Inhalation   Inhale 1 puff into the lungs 2 (two) times daily. Asthma   1 Inhaler   0   . beclomethasone (QVAR) 80 MCG/ACT inhaler   Inhalation   Inhale 1 puff into the lungs as needed.   1 Inhaler   12   . cyclobenzaprine (FLEXERIL) 10 MG tablet   Oral   Take 1 tablet (10 mg total) by mouth 2 (two) times daily as needed for muscle spasms.   20 tablet   0   . ibuprofen (ADVIL,MOTRIN) 200 MG tablet   Oral   Take 800 mg by mouth every 6 (six) hours as needed. For pain         . methocarbamol (ROBAXIN) 500 MG tablet   Oral   Take 1 tablet (500 mg total) by mouth 2 (two) times  daily.   20 tablet   0   . Multiple Vitamin (MULTIVITAMIN WITH MINERALS) TABS   Oral   Take 1 tablet by mouth daily.         . naproxen (NAPROSYN) 500 MG tablet   Oral   Take 1 tablet (500 mg total) by mouth 2 (two) times daily with a meal.   30 tablet   0   . ondansetron (ZOFRAN-ODT) 4 MG disintegrating tablet   Oral   Take 1 tablet (4 mg total) by mouth every 8 (eight) hours as needed for nausea.   10 tablet   0   . tiotropium (SPIRIVA HANDIHALER) 18 MCG inhalation capsule   Inhalation   Place 1 capsule (18 mcg total) into inhaler and inhale daily.   30 capsule   2   . traMADol (ULTRAM) 50 MG tablet   Oral   Take 1 tablet (50 mg total) by mouth every 6 (six) hours as needed for pain.   15 tablet   0     There were no vitals taken for this visit.  Physical  Exam  Nursing note and vitals reviewed. Constitutional: She is oriented to person, place, and time. She appears well-developed and well-nourished. No distress.  HENT:  Head: Normocephalic and atraumatic.  Mouth/Throat: Oropharynx is clear and moist.  Eyes: Conjunctivae are normal.  Neck: Normal range of motion. Neck supple.  Cardiovascular: Normal rate, regular rhythm, normal heart sounds and intact distal pulses.   Pulmonary/Chest: Effort normal and breath sounds normal. No respiratory distress. She has no wheezes. She has no rales.  Musculoskeletal: Normal range of motion. She exhibits no edema.       Lumbar back: She exhibits tenderness and spasm. She exhibits normal range of motion, no bony tenderness, no edema and normal pulse.       Back:  Neurological: She is alert and oriented to person, place, and time. She has normal strength. No sensory deficit. Gait normal.  Skin: Skin is warm and dry. She is not diaphoretic.  Psychiatric: She has a normal mood and affect. Her behavior is normal.    ED Course  Procedures (including critical care time)  Labs Reviewed - No data to display No results found.   1. Medication refill   2. Back strain, initial encounter       MDM  37 y/o female requesting medication refill- medications refilled. Discussed importance of establishing care with PCP, resource guide given. Rx robaxin for muscle spasm. No red flags concerning patient's back pain. No s/s of central cord compression or cauda equina. Lower extremities are neurovascularly intact and patient is ambulating without difficulty. Conservative measured discussed. Return precautions discussed. Patient states understanding of plan and is agreeable.         Trevor Mace, PA-C 02/23/13 1704

## 2014-09-22 ENCOUNTER — Encounter (HOSPITAL_COMMUNITY): Payer: Self-pay

## 2014-09-22 ENCOUNTER — Emergency Department (INDEPENDENT_AMBULATORY_CARE_PROVIDER_SITE_OTHER)
Admission: EM | Admit: 2014-09-22 | Discharge: 2014-09-22 | Disposition: A | Discharge status: Home / Self Care | Payer: Self-pay | Source: Home / Self Care | Attending: Family Medicine | Admitting: Family Medicine

## 2014-09-22 ENCOUNTER — Ambulatory Visit (HOSPITAL_COMMUNITY): Payer: Medicaid Other | Attending: Emergency Medicine

## 2014-09-22 DIAGNOSIS — M5441 Lumbago with sciatica, right side: Secondary | ICD-10-CM

## 2014-09-22 DIAGNOSIS — R52 Pain, unspecified: Secondary | ICD-10-CM | POA: Insufficient documentation

## 2014-09-22 DIAGNOSIS — M5136 Other intervertebral disc degeneration, lumbar region: Secondary | ICD-10-CM | POA: Insufficient documentation

## 2014-09-22 DIAGNOSIS — M2578 Osteophyte, vertebrae: Secondary | ICD-10-CM | POA: Diagnosis not present

## 2014-09-22 DIAGNOSIS — W19XXXA Unspecified fall, initial encounter: Secondary | ICD-10-CM | POA: Insufficient documentation

## 2014-09-22 MED ORDER — METAXALONE 800 MG PO TABS: 800.0000 mg | ORAL_TABLET | Freq: Three times a day (TID) | ORAL | Status: DC

## 2014-09-22 MED ORDER — DICLOFENAC POTASSIUM 50 MG PO TABS: 50.0000 mg | ORAL_TABLET | Freq: Three times a day (TID) | ORAL | Status: DC

## 2014-09-22 NOTE — ED Notes (Signed)
States she tried to catch a cup about 1 month ago , lost her balance, fell onto her buttocks on concrete surface. States since then has bee having pain in her back .  C/o pain is '9 ' on 1-10 scale . NAD, calm, color good, conversant.

## 2014-09-22 NOTE — Discharge Instructions (Signed)
Use heat and medicine as prescribed, see your doctor if further problems. °

## 2014-09-22 NOTE — ED Notes (Signed)
To xray for back fils via shuttle

## 2014-09-22 NOTE — ED Provider Notes (Signed)
CSN: [id number]     Arrival date & time 09/22/14  0935 History   None    Chief Complaint  Patient presents with  . Fall   (Consider location/radiation/quality/duration/timing/severity/associated sxs/prior Treatment) Patient is a 38 y.o. female presenting with back pain. The history is provided by the patient.  Back Pain Location:  Lumbar spine Quality:  Aching Radiates to:  L posterior upper leg and R posterior upper leg Pain severity:  Severe Pain is:  Same all the time Onset quality:  Sudden (due to fall) Duration:  1 month Timing:  Constant Progression:  Unchanged Chronicity:  New Context: falling   Relieved by:  Nothing Worsened by:  Sitting and palpation Ineffective treatments:  NSAIDs Associated symptoms: leg pain   Associated symptoms: no abdominal pain, no bladder incontinence, no bowel incontinence, no fever, no numbness, no perianal numbness and no tingling     Past Medical History  Diagnosis Date  . Asthma    Past Surgical History  Procedure Laterality Date  . Cesarean section     Family History  Problem Relation Age of Onset  . Diabetes Mother   . Diabetes Father    History  Substance Use Topics  . Smoking status: Current Every Day Smoker  . Smokeless tobacco: Not on file  . Alcohol Use: Yes   OB History    No data available     Review of Systems  Constitutional: Negative for fever.  Gastrointestinal: Negative for abdominal pain and bowel incontinence.  Genitourinary: Negative for bladder incontinence.  Musculoskeletal: Positive for back pain.  Neurological: Negative for tingling and numbness.    Allergies  Review of patient's allergies indicates no known allergies.  Home Medications   Prior to Admission medications   Medication Sig Start Date End Date Taking? Authorizing Provider  albuterol (PROVENTIL HFA;VENTOLIN HFA) 108 (90 BASE) MCG/ACT inhaler Inhale 1-2 puffs into the lungs every 6 (six) hours as needed for wheezing. 02/23/13  Yes  Robyn M Hess, PA-C  beclomethasone (QVAR) 80 MCG/ACT inhaler Inhale 1 puff into the lungs as needed. 02/23/13  Yes Robyn M Hess, PA-C  tiotropium (SPIRIVA HANDIHALER) 18 MCG inhalation capsule Place 1 capsule (18 mcg total) into inhaler and inhale daily. 02/23/13  Yes Robyn M Hess, PA-C  albuterol (PROVENTIL HFA;VENTOLIN HFA) 108 (90 BASE) MCG/ACT inhaler Inhale 2 puffs into the lungs every 6 (six) hours as needed. Shortness of breath 11/16/12   Sharin GraveAdlih Moreno-Coll, MD  Aspirin-Acetaminophen-Caffeine (GOODY HEADACHE PO) Take 1 Package by mouth. Pain    Historical Provider, MD  beclomethasone (QVAR) 80 MCG/ACT inhaler Inhale 1 puff into the lungs 2 (two) times daily. Asthma 11/16/12   Adlih Moreno-Coll, MD  cyclobenzaprine (FLEXERIL) 10 MG tablet Take 1 tablet (10 mg total) by mouth 2 (two) times daily as needed for muscle spasms. 11/16/12   Adlih Moreno-Coll, MD  ibuprofen (ADVIL,MOTRIN) 200 MG tablet Take 800 mg by mouth every 6 (six) hours as needed. For pain    Historical Provider, MD  methocarbamol (ROBAXIN) 500 MG tablet Take 1 tablet (500 mg total) by mouth 2 (two) times daily. 02/23/13   Kathrynn Speedobyn M Hess, PA-C  Multiple Vitamin (MULTIVITAMIN WITH MINERALS) TABS Take 1 tablet by mouth daily.    Historical Provider, MD  naproxen (NAPROSYN) 500 MG tablet Take 1 tablet (500 mg total) by mouth 2 (two) times daily with a meal. 11/16/12   Adlih Moreno-Coll, MD  ondansetron (ZOFRAN-ODT) 4 MG disintegrating tablet Take 1 tablet (4 mg total) by mouth every  8 (eight) hours as needed for nausea. 11/16/12   Adlih Moreno-Coll, MD  traMADol (ULTRAM) 50 MG tablet Take 1 tablet (50 mg total) by mouth every 6 (six) hours as needed for pain. 11/16/12   Adlih Moreno-Coll, MD   BP 148/93 mmHg  Pulse 96  Temp(Src) 97.9 F (36.6 C) (Oral)  Resp 16  SpO2 100%  LMP 09/10/2014 (Exact Date) Physical Exam  Constitutional: She appears well-developed and well-nourished.  Appears in pain  Musculoskeletal:       Lumbar back: She  exhibits tenderness and bony tenderness. She exhibits no swelling and no deformity.  Refuses back ROM due to pain    ED Course  Procedures (including critical care time) Labs Review Labs Reviewed - No data to display  Imaging Review No results found.  Awaiting L-spine xray results.  MDM  No diagnosis found.     Cathlyn ParsonsAngela M Genette Huertas, NP 09/22/14 1320

## 2015-01-08 ENCOUNTER — Telehealth: Payer: Self-pay | Admitting: Internal Medicine

## 2015-01-08 NOTE — Telephone Encounter (Signed)
Call to patient to confirm appointment for 01/12/15 at 3:00 lmtcb

## 2015-01-12 ENCOUNTER — Encounter: Payer: Self-pay | Admitting: Internal Medicine

## 2015-01-12 ENCOUNTER — Ambulatory Visit (INDEPENDENT_AMBULATORY_CARE_PROVIDER_SITE_OTHER): Payer: Medicaid Other | Admitting: Internal Medicine

## 2015-01-12 VITALS — BP 121/70 | HR 80 | Temp 97.6°F | Ht 62.0 in | Wt 148.3 lb

## 2015-01-12 DIAGNOSIS — M542 Cervicalgia: Secondary | ICD-10-CM | POA: Insufficient documentation

## 2015-01-12 DIAGNOSIS — J449 Chronic obstructive pulmonary disease, unspecified: Secondary | ICD-10-CM | POA: Diagnosis not present

## 2015-01-12 DIAGNOSIS — Z23 Encounter for immunization: Secondary | ICD-10-CM

## 2015-01-12 DIAGNOSIS — Z0000 Encounter for general adult medical examination without abnormal findings: Secondary | ICD-10-CM | POA: Diagnosis not present

## 2015-01-12 DIAGNOSIS — Z720 Tobacco use: Secondary | ICD-10-CM

## 2015-01-12 MED ORDER — VARENICLINE TARTRATE 0.5 MG X 11 & 1 MG X 42 PO MISC: ORAL | Status: DC

## 2015-01-12 NOTE — Assessment & Plan Note (Signed)
[name] received Tdap today. She should have pap smear at future visit with PCP

## 2015-01-12 NOTE — Progress Notes (Signed)
   Subjective:    Patient ID: [name], female    DOB: 04/13/1976, 39 y.o.   MRN: [id number]  HPI  Mr Brunetta GeneraSeay is a 39 year old woman with COPD, tobacco abuse here to establish as a new patient.   She previously saw Dr Concepcion [place]buere [place]. She stopped going about a year ago and stopped seeing him because she was not satisfied with her care. She has been told she has COPD but is unsure of work-up. She has smoked for the past 30 years ~ 1 ppd. She previously was on chantix but said when her daughter was hospitalized she started smoking again. She is interested in trying chantix again.   She is also is concerned about a "knot" she has on the left side of her neck. She has had it for about a year and a half. She thinks it is getting bigger. It only hurts when it is touched. Hot shower and certain movements make it feel better.    Review of Systems  Constitutional: Negative for fever, chills and diaphoresis.  Respiratory: Negative for cough and shortness of breath.   Cardiovascular: Negative for chest pain.  Gastrointestinal: Negative for nausea, vomiting, abdominal pain, diarrhea and constipation.  Neurological: Negative for dizziness, weakness, light-headedness and numbness.       Objective:   Physical Exam  Constitutional: She is oriented to person, place, and time. She appears well-developed and well-nourished. No distress.  HENT:  Head: Normocephalic and atraumatic.  Mouth/Throat: Oropharynx is clear and moist.  Eyes: EOM are normal. Pupils are equal, round, and reactive to light.  Neck: No thyromegaly present.  Tenderness on deep palpation of the L neck without discrete mass, no lymphadenopathy  Cardiovascular: Normal rate, regular rhythm, normal heart sounds and intact distal pulses.  Exam reveals no gallop and no friction rub.   No murmur heard. Pulmonary/Chest: Breath sounds normal. No respiratory distress. She has no wheezes.  Abdominal: Soft. Bowel sounds are normal. She  exhibits no distension. There is no tenderness.  Abdominal striae  Lymphadenopathy:    She has no cervical adenopathy.  Neurological: She is alert and oriented to person, place, and time.  Skin: She is not diaphoretic.  Vitals reviewed.         Assessment & Plan:

## 2015-01-12 NOTE — Assessment & Plan Note (Signed)
[name] has this "knot" feeling on her L neck for over a year. On exam, no mass including lymphadenopathy was palpable but she had tenderness on deep palpation. This is likely muscular as she says hot showers make it feel better as does certain movements.  -continue to monitor

## 2015-01-12 NOTE — Assessment & Plan Note (Signed)
  Assessment: Progress toward smoking cessation:  smoking the same amount Barriers to progress toward smoking cessation:    Comments: [name] has smoked about a pack of cigarettes a day since she was 39 years old. She says that she has had success in the past with chantix but relapsed due to personal stress. She is interested in trying it again. She denies any history of depression, suicidal or homicidal ideation  Plan: Instruction/counseling given:  I counseled patient on the dangers of tobacco use, advised patient to stop smoking, and reviewed strategies to maximize success. Educational resources provided:  Quitline[employer]er, jewellery(1-800-QUIT-NOW) brochure Self management tools provided:  smoking cessation plan (STAR Quit Plan) Medications to assist with smoking cessation:  Varenicline (Chantix) Patient agreed to the following self-care plans for smoking cessation:    Other plans: Gave [name] the one month chantix starter pack and instruction to take 0.5 mg daily x 3 d, 0.5 mg bid x 4 days, then quit smoking, then 1 mg bid and return to clinic in 3 weeks for reassessment. Also gave clear return precautions for SI/HI or depression

## 2015-01-12 NOTE — Patient Instructions (Addendum)
It was a pleasure to see you today. We are so thrilled you are quitting smoking. Please take one tab (0.5 mg) once a day for 3 days. Then take one tab (0.5 mg) twice a day for 4 days. You should stop smoking at this point (one week into treatment). Then take one big tab (1 mg) twice a day. We will see you in three weeks to see how you are doing. Please return to clinic or seek medical attention if you have any new or worsening thoughts of hurting yourself or others, trouble breathing, or other worrisome medical condition. We look forward to seeing you again in 3 weeks.  Christina LyAdam Carleta Woodrow, MD  General Instructions:   Thank you for bringing your medicines today. This helps us keep you safe from mistakes.   Progress Toward Treatment Goals:  Treatment Goal 01/12/2015  Stop smoking smoking the same amount    Self Care Goals & Plans:  No flowsheet data found.  No flowsheet data found.   Care Management & Community Referrals:  No flowsheet data found.

## 2015-01-12 NOTE — Progress Notes (Signed)
   Subjective:    Patient ID: [name], female    DOB: 1976-01-05, 39 y.o.   MRN: [id number]  HPI  [name] is a 39 year old woman with COPD, tobacco abuse. She [place] She stopped going about a year ago and stopped seeing him because she was not satisfied with her care. She has been told she has COPD but is unsure of work-up. She has smoked for the past 30 years ~ 1 ppd. She previously was on chantix but said when her daughter was hospitalized she started smoking again. She is interested in trying chantix again.   She is also is concerned about a "knot" she has on the left side of her neck. She has had it for about a year and a half. She thinks it is getting bigger. It only hurts when it is touched. Hot shower and certain movements make it feel better.   Review of Systems     Objective:   Physical Exam        Assessment & Plan:

## 2015-01-12 NOTE — Assessment & Plan Note (Signed)
[name] says she has a previous diagnosis of COPD but no records available. She does not think she has ever had PFT. She is supposedly taking tiotropium 18 mcg inh daily, beclomethasone 80 mcg puff prn, albuterol 2 puff inh q6hprn. -PFT -will continue current medicines as she was clinically prescribed them in past pending PFT results -record request for Dr Concepcion ElkAvbuere

## 2015-01-13 NOTE — Progress Notes (Signed)
Internal Medicine Clinic Attending Date of visit: 01/12/2015  I saw and evaluated the patient.  I personally confirmed the key portions of the history and exam documented by Dr. Rothman and I reviewed pertinent patient test results.  The assessment, diagnosis, and plan were formulated together and I agree with the documentation in the resident's note. 

## 2015-01-21 ENCOUNTER — Ambulatory Visit (HOSPITAL_COMMUNITY)
Admission: RE | Admit: 2015-01-21 | Discharge: 2015-01-21 | Disposition: A | Discharge status: Home / Self Care | Payer: Medicaid Other | Source: Ambulatory Visit | Attending: Internal Medicine | Admitting: Internal Medicine

## 2015-01-21 DIAGNOSIS — J449 Chronic obstructive pulmonary disease, unspecified: Secondary | ICD-10-CM | POA: Insufficient documentation

## 2015-01-21 LAB — PULMONARY FUNCTION TEST
DL/VA % pred: 100 %
DL/VA: 4.46 ml/min/mmHg/L
DLCO unc % pred: 83 %
DLCO unc: 17.43 ml/min/mmHg
FEF 25-75 Post: 2.68 L/sec
FEF 25-75 Pre: 1.89 L/sec
FEF2575-%CHANGE-POST: 41 %
FEF2575-%PRED-POST: 87 %
FEF2575-%Pred-Pre: 61 %
FEV1-%Change-Post: 9 %
FEV1-%PRED-POST: 87 %
FEV1-%Pred-Pre: 79 %
FEV1-POST: 2.48 L
FEV1-Pre: 2.26 L
FEV1FVC-%CHANGE-POST: 4 %
FEV1FVC-%Pred-Pre: 92 %
FEV6-%Change-Post: 5 %
FEV6-%PRED-PRE: 86 %
FEV6-%Pred-Post: 91 %
FEV6-Post: 3.08 L
FEV6-Pre: 2.91 L
FEV6FVC-%Change-Post: 0 %
FEV6FVC-%PRED-PRE: 100 %
FEV6FVC-%Pred-Post: 100 %
FVC-%Change-Post: 5 %
FVC-%PRED-PRE: 85 %
FVC-%Pred-Post: 90 %
FVC-POST: 3.1 L
FVC-Pre: 2.94 L
POST FEV1/FVC RATIO: 80 %
PRE FEV6/FVC RATIO: 99 %
Post FEV6/FVC ratio: 99 %
Pre FEV1/FVC ratio: 77 %
RV % pred: 115 %
RV: 1.64 L
TLC % pred: 93 %
TLC: 4.37 L

## 2015-01-21 MED ORDER — ALBUTEROL SULFATE (2.5 MG/3ML) 0.083% IN NEBU
2.5000 mg | INHALATION_SOLUTION | Freq: Once | RESPIRATORY_TRACT | Status: AC
  Administered 2015-01-21: 2.5 mg via RESPIRATORY_TRACT

## 2015-01-22 ENCOUNTER — Telehealth: Payer: Self-pay | Admitting: Internal Medicine

## 2015-01-22 NOTE — Telephone Encounter (Signed)
Call to patient to confirm appointment for 01/26/15 at 3:45 lmtcb ° °

## 2015-01-26 ENCOUNTER — Ambulatory Visit (INDEPENDENT_AMBULATORY_CARE_PROVIDER_SITE_OTHER): Payer: Medicaid Other | Admitting: Internal Medicine

## 2015-01-26 ENCOUNTER — Encounter: Payer: Self-pay | Admitting: Internal Medicine

## 2015-01-26 VITALS — BP 117/70 | HR 81 | Temp 98.1°F | Ht 62.0 in | Wt 148.1 lb

## 2015-01-26 DIAGNOSIS — Z720 Tobacco use: Secondary | ICD-10-CM | POA: Diagnosis not present

## 2015-01-26 DIAGNOSIS — J449 Chronic obstructive pulmonary disease, unspecified: Secondary | ICD-10-CM

## 2015-01-26 DIAGNOSIS — J302 Other seasonal allergic rhinitis: Secondary | ICD-10-CM

## 2015-01-26 DIAGNOSIS — R10814 Left lower quadrant abdominal tenderness: Secondary | ICD-10-CM

## 2015-01-26 LAB — POCT URINE PREGNANCY: Preg Test, Ur: NEGATIVE

## 2015-01-26 MED ORDER — ALBUTEROL SULFATE HFA 108 (90 BASE) MCG/ACT IN AERS: 1.0000 | INHALATION_SPRAY | Freq: Four times a day (QID) | RESPIRATORY_TRACT | Status: DC | PRN

## 2015-01-26 NOTE — Patient Instructions (Signed)
1. Congratulations on qutting smoking!  I have prescribed an albuterol inhaler for you to use if you need to for shortness of breath or wheezing.  We will call you to arrange the pelvic ultrasound.  Please come back to see your doctor for a PAP smear this month.   2. Please take all medications as prescribed.    3. If you have worsening of your symptoms or new symptoms arise, please call the clinic [phone]), or go to the ER immediately if symptoms are severe.

## 2015-01-26 NOTE — Progress Notes (Signed)
   Subjective:    Patient ID: [name], female    DOB: September 23, 1976, 39 y.o.   MRN: [id number]  HPI Comments: [name] is a 39 year old woman with a PMH as below here for lab results.  Please see problem based charting for A&P.    Past Medical History  Diagnosis Date  . Asthma     Review of Systems  Constitutional: Negative for fever, chills and appetite change.  HENT: Positive for rhinorrhea and sneezing.   Respiratory: Positive for wheezing. Negative for shortness of breath.        Mom reports occasional wheezing at night (not snoring).  Cardiovascular: Negative for chest pain and palpitations.  Gastrointestinal: Positive for constipation. Negative for nausea, vomiting and diarrhea.       Occasional constipation.  Genitourinary: Negative for dysuria, hematuria and menstrual problem.       Filed Vitals:   01/26/15 1611  BP: 117/70  Pulse: 81  Temp: 98.1 F (36.7 C)  TempSrc: Oral  Height: 5\' 2"  (1.575 m)  Weight: 148 lb 1.6 oz (67.178 kg)  SpO2: 100%   Objective:   Physical Exam  Constitutional: She is oriented to person, place, and time. She appears well-developed. No distress.  HENT:  Head: Normocephalic and atraumatic.  Mouth/Throat: Oropharynx is clear and moist. No oropharyngeal exudate.  Eyes: EOM are normal. Pupils are equal, round, and reactive to light.  Neck: Neck supple.  Cardiovascular: Normal rate and regular rhythm.  Exam reveals no gallop and no friction rub.   No murmur heard. Pulmonary/Chest: Effort normal and breath sounds normal. No respiratory distress. She has no wheezes. She has no rales.  Abdominal: Soft. Bowel sounds are normal. She exhibits no distension and no mass. There is tenderness. There is no rebound and no guarding.  LLQ TTP  Musculoskeletal: Normal range of motion. She exhibits no edema or tenderness.  Neurological: She is alert and oriented to person, place, and time. No cranial nerve deficit.  Skin: Skin is warm. She is not  diaphoretic.  Psychiatric: She has a normal mood and affect. Her behavior is normal.  Vitals reviewed.         Assessment & Plan:  Please see problem based charting for A&P.

## 2015-01-26 NOTE — Progress Notes (Signed)
Case discussed with Dr. Wilson at the time of the visit.  We reviewed the resident's history and exam and pertinent patient test results.  I agree with the assessment, diagnosis, and plan of care documented in the resident's note. 

## 2015-01-27 DIAGNOSIS — R10814 Left lower quadrant abdominal tenderness: Secondary | ICD-10-CM | POA: Insufficient documentation

## 2015-01-27 DIAGNOSIS — J302 Other seasonal allergic rhinitis: Secondary | ICD-10-CM | POA: Insufficient documentation

## 2015-01-27 MED ORDER — FLUTICASONE PROPIONATE 50 MCG/ACT NA SUSP: 2.0000 | Freq: Every day | NASAL | Status: DC

## 2015-01-27 NOTE — Assessment & Plan Note (Signed)
LLQ TTP.  She denies GI or menstrual symptoms or abnormalities.  Menses have been normal.  Her last child was born 10 years ago and she does not think she has had a PAP since that time.  She has had tubal ligation.  The location (very low in left pelvic area) is concerning for Gyn pathology.  Less likely GI since she has no other GI symptoms. - Upreg in clinic - negative - pelvic/transvaginal US ordered; will need Gyn referral if abnormalities - return to clinic for PAP

## 2015-01-27 NOTE — Assessment & Plan Note (Signed)
  Assessment: Progress toward smoking cessation:    Quit smoking last week.  Barriers to progress toward smoking cessation:    Comments:  No ADRs with Chantix.    Plan: Instruction/counseling given:  I commended patient for quitting and reviewed strategies for preventing relapses. Educational resources provided:  Quitline[employer] Designer, jewellery(1-800-QUIT-NOW) brochure (quit on 01/20/2015 ) Self management tools provided:    Medications to assist with smoking cessation:  Varenicline (Chantix) Patient agreed to the following self-care plans for smoking cessation:    Other plans: RTC prn

## 2015-01-27 NOTE — Assessment & Plan Note (Signed)
She describes seasonal allergy symptoms and is taking Benadryl daily to control them. - d/c Benadryl - rx for Flonase

## 2015-01-27 NOTE — Assessment & Plan Note (Addendum)
PFTs indicate mild obstruction and there was insignificant response from bronchodilators.  Patient denies dyspnea but occasionally has wheezing.  She was prescribed Qvar and Spiriva by prior doctor at another clinic.  She has not taken these medications in quite a while.  - Qvar and Spiriva removed from med list since this appears to be very mild (FEV/FVC 92% predicted; FEV 79% predicted) - albuterol inhaler prescribed prn for dyspnea or wheezing - patient quit smoking last week with the help of Chantix

## 2015-02-19 ENCOUNTER — Other Ambulatory Visit: Payer: Self-pay | Admitting: Internal Medicine

## 2015-02-19 ENCOUNTER — Ambulatory Visit (HOSPITAL_COMMUNITY)
Admission: RE | Admit: 2015-02-19 | Discharge: 2015-02-19 | Disposition: A | Discharge status: Home / Self Care | Payer: Medicaid Other | Source: Ambulatory Visit | Attending: Internal Medicine | Admitting: Internal Medicine

## 2015-02-19 DIAGNOSIS — R1032 Left lower quadrant pain: Secondary | ICD-10-CM

## 2015-02-19 DIAGNOSIS — R10814 Left lower quadrant abdominal tenderness: Secondary | ICD-10-CM

## 2015-02-19 DIAGNOSIS — N8320 Unspecified ovarian cysts: Secondary | ICD-10-CM | POA: Diagnosis not present

## 2015-02-20 ENCOUNTER — Telehealth: Payer: Self-pay | Admitting: Internal Medicine

## 2015-02-20 ENCOUNTER — Other Ambulatory Visit: Payer: Self-pay | Admitting: Internal Medicine

## 2015-02-20 DIAGNOSIS — N83209 Unspecified ovarian cyst, unspecified side: Secondary | ICD-10-CM | POA: Insufficient documentation

## 2015-02-20 DIAGNOSIS — N83201 Unspecified ovarian cyst, right side: Secondary | ICD-10-CM

## 2015-02-20 DIAGNOSIS — N83202 Unspecified ovarian cyst, left side: Secondary | ICD-10-CM

## 2015-02-20 NOTE — Telephone Encounter (Signed)
I attempted to reach Ms. Seay by telephone to inform her of results of her transvaginal US and plan to refer to OBGyn.  Her mother answered and informed me she would be at work until [employer]10PM tonight.  Her mother says she works 2PM - 10PM.  I informed her there was no emergency and that I would call Ms. Seay back on Monday before 2PM.   Evelena PeatAlex Ascencion Coye, DO

## 2015-02-23 ENCOUNTER — Telehealth: Payer: Self-pay | Admitting: Internal Medicine

## 2015-02-23 NOTE — Telephone Encounter (Signed)
I called and informed [name] of ultrasound findings and recommendation to follow-up with ObGyn.  I let her know I have referred her and someone would be calling her to set up the ObGyn appt.

## 2015-03-03 ENCOUNTER — Other Ambulatory Visit: Payer: Self-pay | Admitting: Internal Medicine

## 2015-03-03 DIAGNOSIS — N83209 Unspecified ovarian cyst, unspecified side: Secondary | ICD-10-CM

## 2015-03-03 NOTE — Assessment & Plan Note (Addendum)
Received message that ObGyn recommends repeat US in 6 weeks and then formal referral to ObGyn only if cysts still present. - order placed for repeat transvaginal US on or after 04/02/15 - patient to be notified

## 2015-03-17 ENCOUNTER — Telehealth: Payer: Self-pay | Admitting: Internal Medicine

## 2015-03-17 NOTE — Telephone Encounter (Signed)
Call to patient to confirm appointment for 03/18/15 at 3:45 lmtcb

## 2015-03-18 ENCOUNTER — Other Ambulatory Visit (HOSPITAL_COMMUNITY)
Admission: RE | Admit: 2015-03-18 | Discharge: 2015-03-18 | Disposition: A | Discharge status: Home / Self Care | Payer: Medicaid Other | Source: Ambulatory Visit | Attending: Oncology | Admitting: Oncology

## 2015-03-18 ENCOUNTER — Ambulatory Visit (INDEPENDENT_AMBULATORY_CARE_PROVIDER_SITE_OTHER): Payer: Medicaid Other | Admitting: Internal Medicine

## 2015-03-18 ENCOUNTER — Encounter: Payer: Self-pay | Admitting: Internal Medicine

## 2015-03-18 VITALS — BP 135/76 | HR 90 | Temp 98.8°F | Ht 62.0 in | Wt 147.0 lb

## 2015-03-18 DIAGNOSIS — R87810 Cervical high risk human papillomavirus (HPV) DNA test positive: Secondary | ICD-10-CM | POA: Diagnosis present

## 2015-03-18 DIAGNOSIS — N8329 Other ovarian cysts: Secondary | ICD-10-CM

## 2015-03-18 DIAGNOSIS — D509 Iron deficiency anemia, unspecified: Secondary | ICD-10-CM

## 2015-03-18 DIAGNOSIS — Z113 Encounter for screening for infections with a predominantly sexual mode of transmission: Secondary | ICD-10-CM | POA: Diagnosis not present

## 2015-03-18 DIAGNOSIS — R10814 Left lower quadrant abdominal tenderness: Secondary | ICD-10-CM | POA: Diagnosis not present

## 2015-03-18 DIAGNOSIS — F17210 Nicotine dependence, cigarettes, uncomplicated: Secondary | ICD-10-CM

## 2015-03-18 DIAGNOSIS — N83201 Unspecified ovarian cyst, right side: Secondary | ICD-10-CM

## 2015-03-18 DIAGNOSIS — Z720 Tobacco use: Secondary | ICD-10-CM

## 2015-03-18 DIAGNOSIS — Z1151 Encounter for screening for human papillomavirus (HPV): Secondary | ICD-10-CM | POA: Insufficient documentation

## 2015-03-18 DIAGNOSIS — Z0000 Encounter for general adult medical examination without abnormal findings: Secondary | ICD-10-CM

## 2015-03-18 DIAGNOSIS — Z01411 Encounter for gynecological examination (general) (routine) with abnormal findings: Secondary | ICD-10-CM | POA: Diagnosis not present

## 2015-03-18 DIAGNOSIS — N83202 Unspecified ovarian cyst, left side: Principal | ICD-10-CM

## 2015-03-18 LAB — FERRITIN: FERRITIN: 11 ng/mL (ref 10–291)

## 2015-03-18 NOTE — Assessment & Plan Note (Addendum)
[name] still has some mild TTP of LLQ. This could be due to her cyst (see separate problem). She is due for pap smear which was done today. She says she has had them in the past and were abnormal but she is unsure why they were abnormal. We have requested records from [facility] with her signing release form. Pap smear chaperoned by RN Lela was grossly normal. She also asked for gc/chlamydia and HIV testing while we did it, with last sexual encounter six months ago with ex-husband. -f/u pap smear, gc/chalmydia probe, HIV -repeat pelvic u/s 04/02/15 and referral to gyn if cysts persist  ADDENDUM: GC/chlamydia negative   Farley LyAdam Stephano Arrants, MD Internal Medicine, PGY-1 Pager [phone] 03/19/2015, 3:31 PM

## 2015-03-18 NOTE — Patient Instructions (Signed)
It was a pleasure to see you today. Congratulations on quitting smoking! We will call you if there are any issues with your lab work and pap smear today. [facility]. Please return to clinic or seek medical attention if you have any new or worsening abdominal pain, nausea, fever, or other worrisome medical condition. We look forward to seeing you again in one year.  Farley LyAdam Peola Joynt, MD  General Instructions:   Thank you for bringing your medicines today. This helps us keep you safe from mistakes.   Progress Toward Treatment Goals:  Treatment Goal 01/12/2015  Stop smoking smoking the same amount    Self Care Goals & Plans:  No flowsheet data found.  No flowsheet data found.   Care Management & Community Referrals:  No flowsheet data found.

## 2015-03-18 NOTE — Assessment & Plan Note (Addendum)
[name] takes ferrous sulfate 325 mg daily. She says she was told she had iron deficiency anemia remotely in the past. She notes heavy menses, last two weeks ago. Last CBC in our EMR was 2009 with normal hemoglobin, no previous ferritin -check CBC, ferritin and reassess need for iron supplementation; may d/c if ferritin very high  ADDENDUM: Hgb 13 with MCV 89 and ferritin 11. Continue iron supplementation.   [name] LyAdam Jhon Mallozzi, MD Internal Medicine, PGY-1 Pager [phone] 03/19/2015, 7:23 AM

## 2015-03-18 NOTE — Assessment & Plan Note (Signed)
[name] had pelvic u/s 5/5 which revealed R ovarian simple cyst, 1.8 cm diameter, and L ovarian septated cyst, 2.1 cm diameter. She was given gynecology referral who said they would like repeat u/s in 6 weeks and if cysts persist they will see her. This is now scheduled for 04/02/15.  -repeat pelvic u/s 04/02/15 and referral to gyn if cysts persist

## 2015-03-18 NOTE — Assessment & Plan Note (Addendum)
Pap smear and HIV testing today  ADDENDUM: HIV negative   Farley LyAdam Annabeth Tortora, MD Internal Medicine, PGY-1 Pager [phone] 03/19/2015, 7:23 AM

## 2015-03-18 NOTE — Progress Notes (Signed)
   Subjective:    Patient ID: [name], female    DOB: May 18, 1976, 39 y.o.   MRN: [id number]  HPI  [name] is a 39 year old woman with COPD who presents for follow-up on her abdominal pain. [facility] for LLQ tenderness to palpation. She had a pelvic ultrasound that showed a septated 2.1 cm L ovarian cyst. She was referred to gynecology who recommended repeat u/s in six weeks and referral if no resolution at that time.  Today, she says she still only have some abdominal pain with tenderness to palpation in the LLQ. She says she has no vaginal discharge, odor, bleeding. Last period 2 weeks ago. She last had sex 6 months ago with ex-husband. She wants gc/chlamydia and HIV testing while we do pap smear.  Review of Systems  Constitutional: Negative for fever, chills and diaphoresis.  Respiratory: Negative for cough and shortness of breath.   Cardiovascular: Negative for chest pain.  Gastrointestinal: Positive for abdominal pain. Negative for nausea, vomiting, diarrhea and constipation.  Neurological: Negative for weakness, light-headedness, numbness and headaches.       Objective:   Physical Exam  Constitutional: She is oriented to person, place, and time. She appears well-developed and well-nourished. No distress.  HENT:  Head: Normocephalic and atraumatic.  Mouth/Throat: Oropharynx is clear and moist.  Eyes: EOM are normal. Pupils are equal, round, and reactive to light.  Cardiovascular: Normal rate, regular rhythm, normal heart sounds and intact distal pulses.  Exam reveals no gallop and no friction rub.   No murmur heard. Pulmonary/Chest: Effort normal and breath sounds normal. No respiratory distress. She has no wheezes.  Abdominal: Soft. Bowel sounds are normal. She exhibits no distension.  Minimal TTP on LLQ  Genitourinary: Vagina normal. Cervix exhibits no motion tenderness, no discharge and no friability. No tenderness in the vagina. No signs of injury around the  vagina. No vaginal discharge found.  Musculoskeletal: She exhibits no edema.  Neurological: She is alert and oriented to person, place, and time.  Skin: She is not diaphoretic.  Vitals reviewed.         Assessment & Plan:

## 2015-03-18 NOTE — Assessment & Plan Note (Signed)
[name] says she has not smoked in one month after using chantix. I congratulated her on this and she says she plans to be tobacco free -continue to reassess her abstinence

## 2015-03-19 LAB — CBC
HCT: 39.8 % (ref 36.0–46.0)
Hemoglobin: 13 g/dL (ref 12.0–15.0)
MCH: 29 pg (ref 26.0–34.0)
MCHC: 32.7 g/dL (ref 30.0–36.0)
MCV: 88.6 fL (ref 78.0–100.0)
MPV: 10.9 fL (ref 8.6–12.4)
PLATELETS: 306 10*3/uL (ref 150–400)
RBC: 4.49 MIL/uL (ref 3.87–5.11)
RDW: 15.2 % (ref 11.5–15.5)
WBC: 7.5 10*3/uL (ref 4.0–10.5)

## 2015-03-19 LAB — CYTOLOGY - PAP

## 2015-03-19 LAB — CERVICOVAGINAL ANCILLARY ONLY
Chlamydia: NEGATIVE
NEISSERIA GONORRHEA: NEGATIVE

## 2015-03-19 LAB — HIV ANTIBODY (ROUTINE TESTING W REFLEX): HIV: NONREACTIVE

## 2015-03-20 ENCOUNTER — Encounter: Payer: Medicaid Other | Admitting: Obstetrics & Gynecology

## 2015-03-20 NOTE — Progress Notes (Signed)
Internal Medicine Clinic Attending  Case discussed with Dr. Rothman at the time of the visit.  We reviewed the resident's history and exam and pertinent patient test results.  I agree with the assessment, diagnosis, and plan of care documented in the resident's note. 

## 2015-03-24 ENCOUNTER — Telehealth: Payer: Self-pay | Admitting: Internal Medicine

## 2015-03-24 NOTE — Telephone Encounter (Signed)
I called [name] with the results of her pap smear. I told her that she was negative for STDs and that her pap smear showed CI-1/HPV (LSIL). I explained that she will need a repeat pap smear and testing in one year instead of three and she was agreeable. She had no questions.  Farley LyAdam Shemeka Wardle, MD Internal Medicine, PGY-1 Pager [phone] 03/24/2015, 10:19 AM

## 2015-04-02 ENCOUNTER — Ambulatory Visit (HOSPITAL_COMMUNITY): Payer: Medicaid Other

## 2015-05-01 ENCOUNTER — Ambulatory Visit (HOSPITAL_COMMUNITY)
Admission: RE | Admit: 2015-05-01 | Discharge: 2015-05-01 | Disposition: A | Discharge status: Home / Self Care | Payer: Medicaid Other | Source: Ambulatory Visit | Attending: Internal Medicine | Admitting: Internal Medicine

## 2015-05-01 DIAGNOSIS — N83209 Unspecified ovarian cyst, unspecified side: Secondary | ICD-10-CM

## 2015-05-01 DIAGNOSIS — N8320 Unspecified ovarian cysts: Secondary | ICD-10-CM | POA: Insufficient documentation

## 2015-06-16 ENCOUNTER — Other Ambulatory Visit: Payer: Self-pay | Admitting: Internal Medicine

## 2015-06-16 DIAGNOSIS — Z720 Tobacco use: Secondary | ICD-10-CM

## 2015-06-16 NOTE — Telephone Encounter (Addendum)
Patient requesting Chantix Refill from [facility] on [address].

## 2015-06-24 ENCOUNTER — Other Ambulatory Visit: Payer: Self-pay | Admitting: Internal Medicine

## 2015-06-24 DIAGNOSIS — Z720 Tobacco use: Secondary | ICD-10-CM

## 2015-06-24 MED ORDER — VARENICLINE TARTRATE 0.5 MG X 11 & 1 MG X 42 PO MISC: ORAL | Status: DC

## 2015-06-24 NOTE — Telephone Encounter (Signed)
Talked with pt and she ran out of chantix.  She did start smoking again TODAY.  Will she need the starter pak or maintenance?

## 2015-07-13 ENCOUNTER — Encounter: Payer: Self-pay | Admitting: Internal Medicine

## 2015-07-13 ENCOUNTER — Ambulatory Visit (INDEPENDENT_AMBULATORY_CARE_PROVIDER_SITE_OTHER): Payer: Medicaid Other | Admitting: Internal Medicine

## 2015-07-13 VITALS — BP 118/69 | HR 79 | Temp 97.6°F | Ht 62.0 in | Wt 145.1 lb

## 2015-07-13 DIAGNOSIS — N926 Irregular menstruation, unspecified: Secondary | ICD-10-CM

## 2015-07-13 DIAGNOSIS — D509 Iron deficiency anemia, unspecified: Secondary | ICD-10-CM

## 2015-07-13 DIAGNOSIS — M79606 Pain in leg, unspecified: Secondary | ICD-10-CM

## 2015-07-13 DIAGNOSIS — G2581 Restless legs syndrome: Secondary | ICD-10-CM | POA: Diagnosis not present

## 2015-07-13 DIAGNOSIS — N921 Excessive and frequent menstruation with irregular cycle: Secondary | ICD-10-CM | POA: Diagnosis not present

## 2015-07-13 DIAGNOSIS — R1030 Lower abdominal pain, unspecified: Secondary | ICD-10-CM

## 2015-07-13 LAB — POCT URINE PREGNANCY: PREG TEST UR: NEGATIVE

## 2015-07-13 MED ORDER — POLYETHYLENE GLYCOL 3350 17 G PO PACK: 17.0000 g | PACK | Freq: Every day | ORAL | Status: DC

## 2015-07-13 NOTE — Progress Notes (Signed)
Patient ID: [name], female   DOB: September 07, 1976, 39 y.o.   MRN: [id number]   Subjective:   Patient ID: [name] female   DOB: 06/16/1976 39 y.o.   MRN: [id number]  HPI: [name] is a 39 y.o. with PMH listed below. Presented today with c/o ankle pains and leg pains and feet pain, started a week ago, she twisted both ankles overthe [past week- she stepped wrong. Both legs ache- this is a chronic issue for patient. She says he has to move her legs around and this helps sometimes,she rub them down, and she thinks this helps. No loose stools, she is usually hard and has a bowel movement 1ce in 2 weeks, she drinks lots of water, but does not take stool softners. She eats a lot of ice. No known family hx of celiacs dx of absorption dsd. She has been taking alot of goody powders of the pain on her feet. She is supposed to be on iron tablest, but has not been very complaint, taking them only when she is about to have her periods.  She also has complaints of seeing her period, last saw her period- august 31st to Oct 5th, which was a normal period, and then she started seeing her period again from 21st of this month ( about 2 weeks after her last period), lasting about 6 days, she is still spotting, initially it was very heavy, without cramps. She has been under a lot recently, more than usual, as pertains living situation- they are they living in a motel presently. She is sexually active- last sexual activity 6th of this month. No fevers, but she has been having sudden feeling of being hot. No vaginal dsg, No usaul smells, itching or swelling. She denies post-coital bleeding.  She enodrses hair falling off, but no skin changes, voice changes, she denies been tired, but endorses depression form her current living situation, and there does not appear to be any significant weight changes, she doe snot tolerate heat well, and appetite has been ok. No drugs, she smokes cigs again, she occasionaly drinks  alcohol. Cousin had ovarian cancer, no family hx of endometrial cancer.   Past Medical History  Diagnosis Date  . Asthma    Current Outpatient Prescriptions  Medication Sig Dispense Refill  . acetaminophen (TYLENOL) 500 MG tablet Take 500 mg by mouth as needed.    Marland Kitchen albuterol (PROVENTIL HFA;VENTOLIN HFA) 108 (90 BASE) MCG/ACT inhaler Inhale 1-2 puffs into the lungs every 6 (six) hours as needed for wheezing or shortness of breath. 1 Inhaler 0  . ferrous sulfate 325 (65 FE) MG tablet Take 325 mg by mouth daily with breakfast.    . fluticasone (FLONASE) 50 MCG/ACT nasal spray Place 2 sprays into both nostrils daily. 16 g 2  . Multiple Vitamin (MULTIVITAMIN WITH MINERALS) TABS Take 1 tablet by mouth daily.    . varenicline (CHANTIX STARTING MONTH PAK) 0.5 MG X 11 & 1 MG X 42 tablet Take one 0.5 mg tablet by mouth once daily for 3 days, then one 0.5 mg tablet twice daily for 4 days, then one 1 mg tablet twice daily. 53 tablet 0   No current facility-administered medications for this visit.   Family History  Problem Relation Age of Onset  . Diabetes Mother   . Diabetes Father    Social History   Social History  . Marital Status: Single    Spouse Name: N/A  . Number of Children: N/A  .  Years of Education: N/A   Social History Main Topics  . Smoking status: Current Some Day Smoker    Last Attempt to Quit: 01/20/2015  . Smokeless tobacco: None     Comment: 1PPD.  Recently quit on4/02/2015 / STARTED BACKI ABOUT WEEJ AGI  . Alcohol Use: 0.0 oz/week    0 Standard drinks or equivalent per week  . Drug Use: None  . Sexual Activity: Not Asked   Other Topics Concern  . None   Social History Narrative   Review of Systems: CONSTITUTIONAL- No Fever, weightloss, night sweat or change in appetite. SKIN- No Rash, colour changes or itching. HEAD- No Headache or dizziness. EYES- No Vision loss, double or blurred vision. EARS- No vertigo, hearing loss or ear discharge. Mouth/throat- No  Sorethroat,  or bleeding gums. RESPIRATORY- No Cough or SOB. CARDIAC- No Palpitations, DOE, PND or chest pain. GI- No nausea, vomiting, diarrhoea, constipation, abd pain. URINARY- No Frequency, or dysuria. NEUROLOGIC- No Numbness, seizures or . [facility]- Denies depression or anxiety.  Objective:  Physical Exam: Filed Vitals:   07/13/15 0947  BP: 118/69  Pulse: 79  Temp: 97.6 F (36.4 C)  TempSrc: Oral  Height:  (1.575 m)  Weight: 145 lb 1.6 oz (65.817 kg)  SpO2: 99%   GENERAL- alert, co-operative, appears as stated age, not in any distress. HEENT- Atraumatic, normocephalic,  EOMI, oral mucosa appears moist CARDIAC- RRR, no murmurs, rubs or gallops. RESP- Moving equal volumes of air, and clear to auscultation bilaterally, no wheezes or crackles. ABDOMEN- Soft, nontender, no palpable masses or organomegaly, bowel sounds present. Pelvic exam- Speculum exam, No masses identified in vulva or vagina, walls appear normal, slight bleeding from os. Cervix appeasr healthy. NEURO- Alert and oriented, Gait- Normal. EXTREMITIES- Warm and well perfused, no pedal edema. SKIN- Warm, dry, No rash or lesion. PSYCH- Normal mood and affect, appropriate thought content and speech.  Assessment & Plan:   The patient's case and plan of care was discussed with attending physician, Dr. Criselda Peaches.  Please see problem based charting for assessment and plan.

## 2015-07-13 NOTE — Assessment & Plan Note (Signed)
Most likely cause of patients- restless legs at night, with lower extremity aches, and pt having a craving for ice. Pt last Ferritin - 11, no Anemia noted on CBC. Pt has not been complaint with fe pills,she has been taking it for 2-3 days only during her periods. Doubt malabsorptions dsd.  Plan- Anemia panel - Emphasized taking Fe tablets everyday,  once a day. - Miralax for consitipation, daily, and then PRN is needed

## 2015-07-13 NOTE — Patient Instructions (Signed)
We will be doing some lab work today. We will call you with the results. For now, continue taking the Iron tablets, this is most likely the cause of your Leg pain. As the last time your Iron levels were checked they were very low.   Also for your periods we will be doing some test. Most likely this is related to stress. If this does not resolve in the next few days we will send you to a gynecologist.

## 2015-07-13 NOTE — Assessment & Plan Note (Signed)
Patient having her second menstrual period in 2 weeks. Also with significant constipation, feels tired. Differentails- Stress related as she has been under more stress lately, pregnancy, Hypothyroidism,. Last Ultrasound- May and then June- first ultrasound showed ovarian cysyts, normal endometrium without fibroids, second ultrasound did not remark on endometrial thickness but noted resolution of ovarian cysts. No family hx of endometrial cancer, a cousin had ovarian cancer.  Plan- TSH - Miralax daily 17g - Will refer to OB if period persist by next visit, or this recurs. - Urine pregnancy test.

## 2015-07-14 LAB — ANEMIA PROFILE B
Basophils Absolute: 0.1 10*3/uL (ref 0.0–0.2)
Basos: 1 %
EOS (ABSOLUTE): 0.2 10*3/uL (ref 0.0–0.4)
Eos: 3 %
FOLATE: 14 ng/mL (ref >3.0)
Ferritin: 10 ng/mL — ABNORMAL LOW (ref 15–150)
Hematocrit: 39.4 % (ref 34.0–46.6)
Hemoglobin: 12.2 g/dL (ref 11.1–15.9)
IRON SATURATION: 9 % — AB (ref 15–55)
Immature Grans (Abs): 0 10*3/uL (ref 0.0–0.1)
Immature Granulocytes: 0 %
Iron: 35 ug/dL (ref 27–159)
LYMPHS ABS: 1.3 10*3/uL (ref 0.7–3.1)
Lymphs: 20 %
MCH: 26.3 pg — AB (ref 26.6–33.0)
MCHC: 31 g/dL — AB (ref 31.5–35.7)
MCV: 85 fL (ref 79–97)
MONOS ABS: 0.4 10*3/uL (ref 0.1–0.9)
Monocytes: 6 %
NEUTROS PCT: 70 %
Neutrophils Absolute: 4.5 10*3/uL (ref 1.4–7.0)
Platelets: 306 10*3/uL (ref 150–379)
RBC: 4.63 x10E6/uL (ref 3.77–5.28)
RDW: 16 % — ABNORMAL HIGH (ref 12.3–15.4)
Retic Ct Pct: 1 % (ref 0.6–2.6)
Total Iron Binding Capacity: 387 ug/dL (ref 250–450)
UIBC: 352 ug/dL (ref 131–425)
Vitamin B-12: 320 pg/mL (ref 211–946)
WBC: 6.5 10*3/uL (ref 3.4–10.8)

## 2015-07-14 LAB — TSH: TSH: 0.73 u[IU]/mL (ref 0.450–4.500)

## 2015-07-14 LAB — URINALYSIS, ROUTINE W REFLEX MICROSCOPIC
Bilirubin, UA: NEGATIVE
GLUCOSE, UA: NEGATIVE
KETONES UA: NEGATIVE
LEUKOCYTES UA: NEGATIVE
Nitrite, UA: NEGATIVE
PROTEIN UA: NEGATIVE
SPEC GRAV UA: 1.007 (ref 1.005–1.030)
Urobilinogen, Ur: 0.2 mg/dL (ref 0.2–1.0)
pH, UA: 5.5 (ref 5.0–7.5)

## 2015-07-14 LAB — MICROSCOPIC EXAMINATION: Casts: NONE SEEN /LPF

## 2015-07-14 NOTE — Progress Notes (Signed)
Internal Medicine Clinic Attending  Case discussed with Dr. Emokpae soon after the resident saw the patient.  We reviewed the resident's history and exam and pertinent patient test results.  I agree with the assessment, diagnosis, and plan of care documented in the resident's note. 

## 2015-07-17 ENCOUNTER — Telehealth: Payer: Self-pay | Admitting: Internal Medicine

## 2015-07-17 NOTE — Telephone Encounter (Signed)
Pt want to know the result of pregnancy test that she took at her last visit. Please call pt back.

## 2015-07-20 NOTE — Telephone Encounter (Signed)
Pt has been called 3 times by Dr Mariea Clonts concerning lab results. I tried again today.  No answer.  Message left.

## 2015-07-20 NOTE — Telephone Encounter (Signed)
Pt called again.  No answer. 

## 2015-07-22 ENCOUNTER — Telehealth: Payer: Self-pay | Admitting: Internal Medicine

## 2015-07-22 NOTE — Telephone Encounter (Signed)
Please call pt back.

## 2015-07-22 NOTE — Telephone Encounter (Signed)
Pt was given her lab work and she will call back to schedule a f/u visit

## 2015-08-16 IMAGING — US US PELVIS COMPLETE
1 series · 13 of 25 positions shown · non-contrast
Comparison: None

CLINICAL DATA: One year history of left lower quadrant pain;
patient fell on to concrete surface 1 year ago

EXAM:
TRANSABDOMINAL AND TRANSVAGINAL ULTRASOUND OF PELVIS
TECHNIQUE: Both transabdominal and transvaginal ultrasound examinations of the
pelvis were performed. Transabdominal technique was performed for
global imaging of the pelvis including uterus, ovaries, adnexal
regions, and pelvic cul-de-sac. It was necessary to proceed with
endovaginal exam following the transabdominal exam to visualize the
endometrium and ovaries.

[Series 1: us pelvis complete · 13 of 68 slices shown]
[im 1/68]
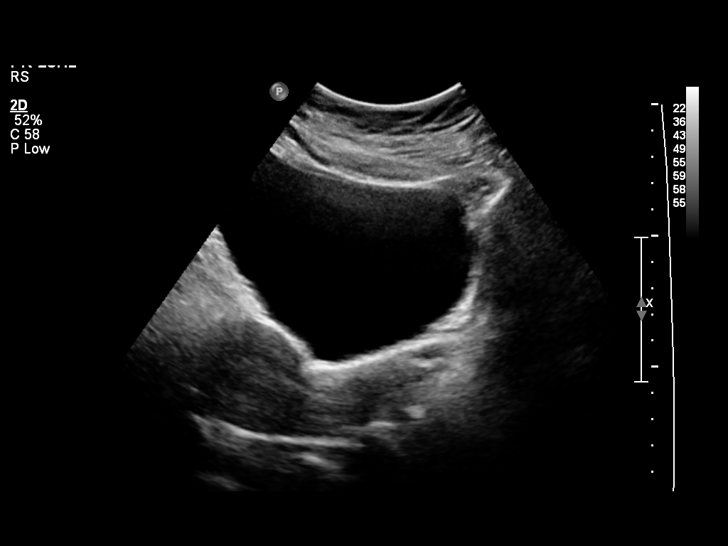
[im 6/68]
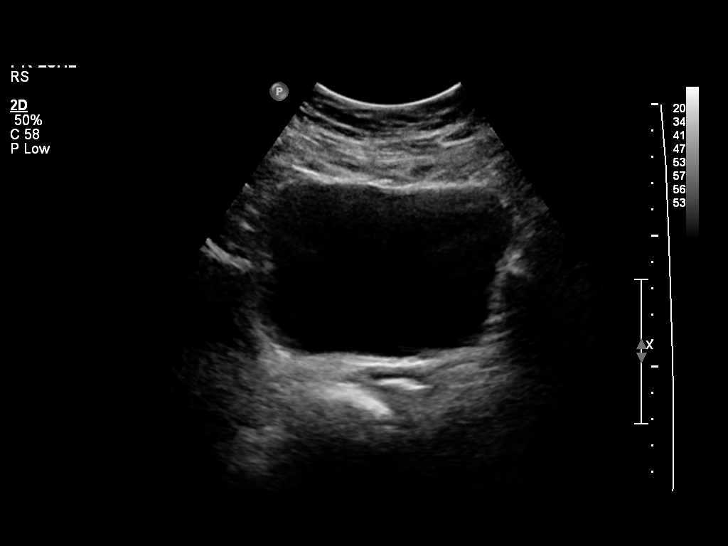
[im 12/68]
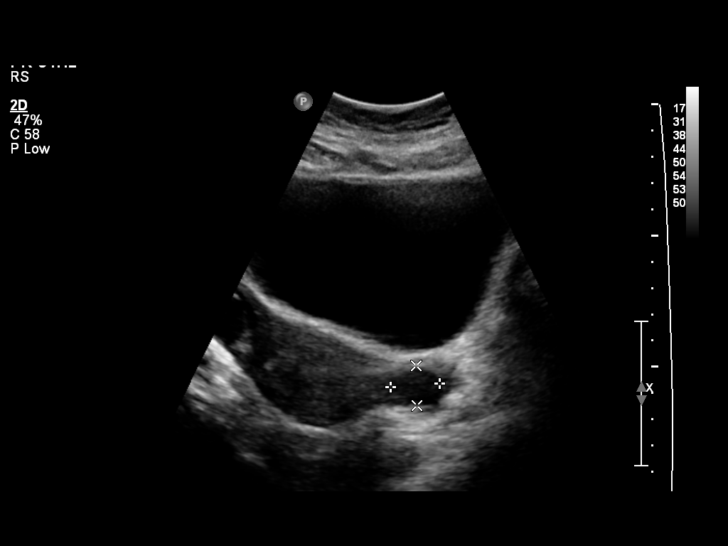
[im 17/68]
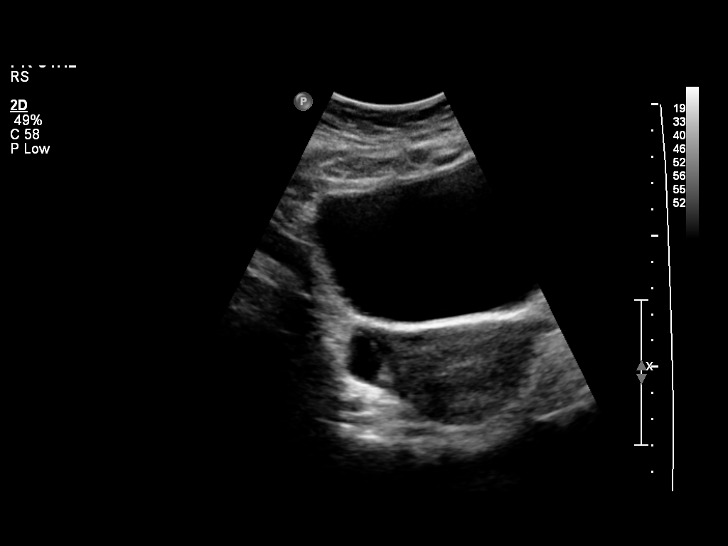
[im 23/68]
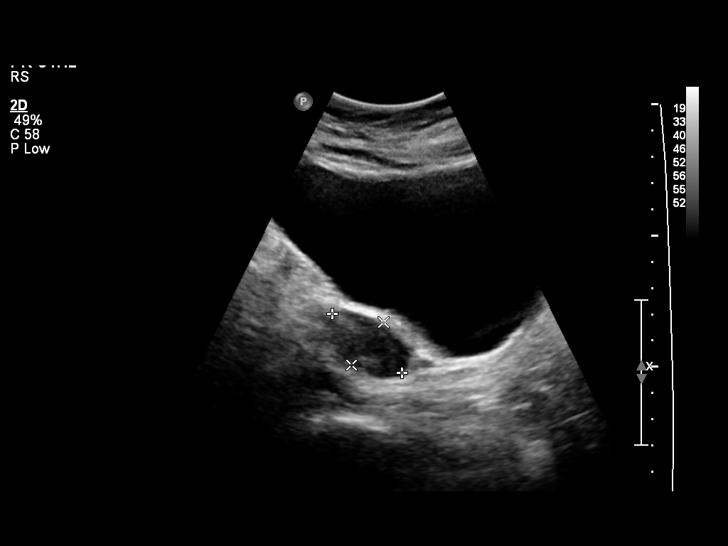
[im 28/68]
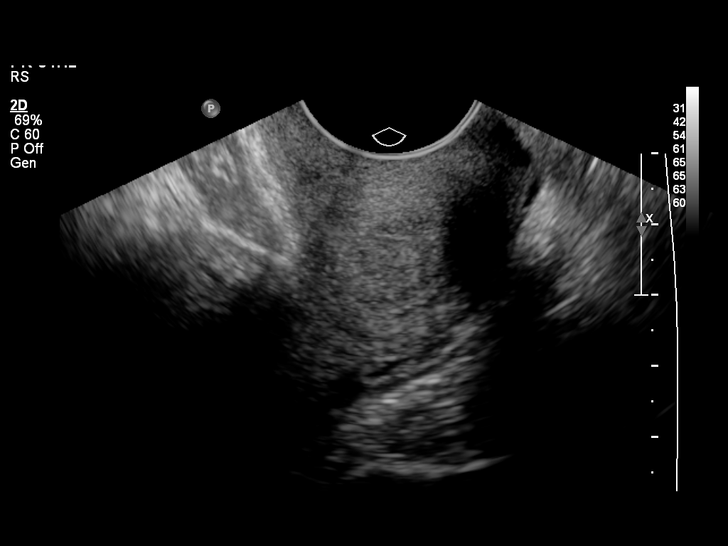
[im 34/68]
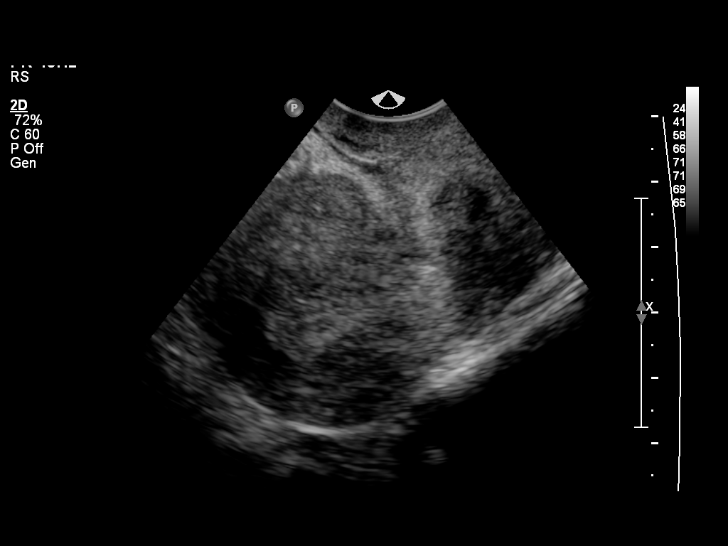
[im 40/68]
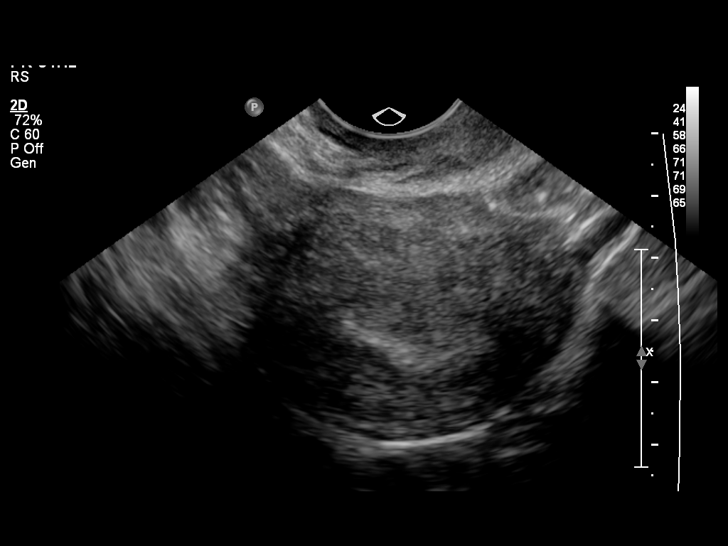
[im 45/68]
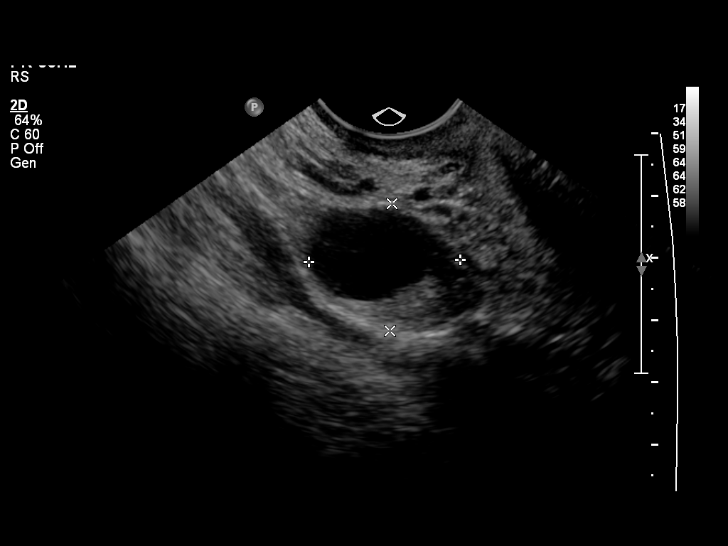
[im 51/68]
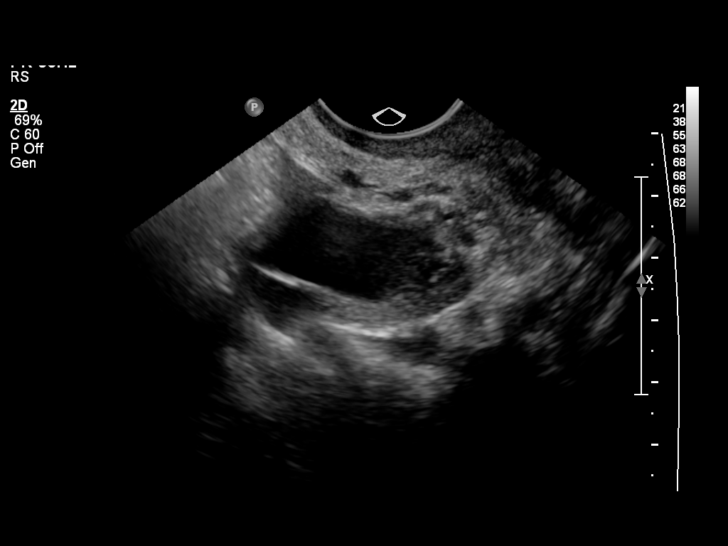
[im 56/68]
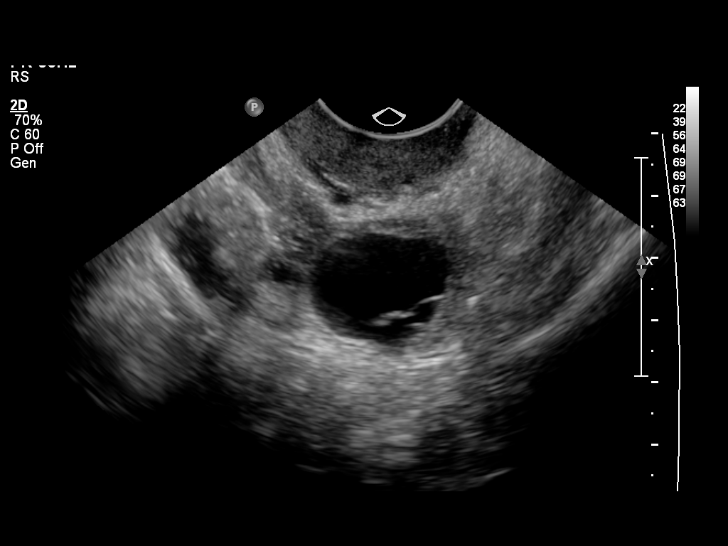
[im 62/68]
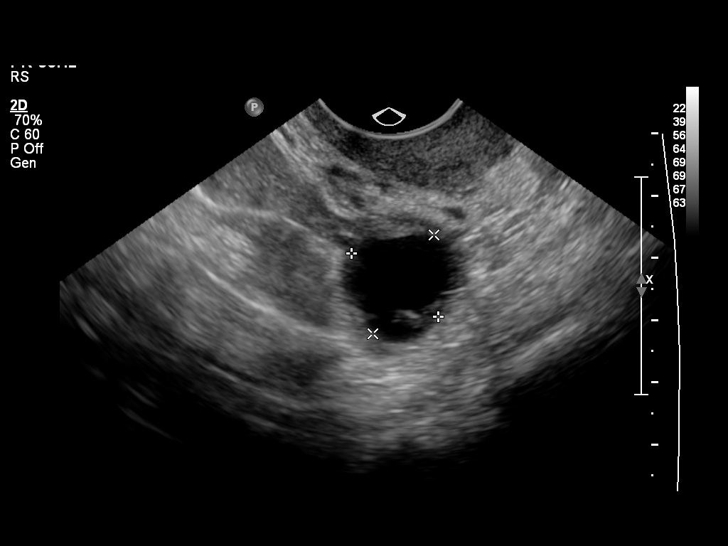
[im 68/68]
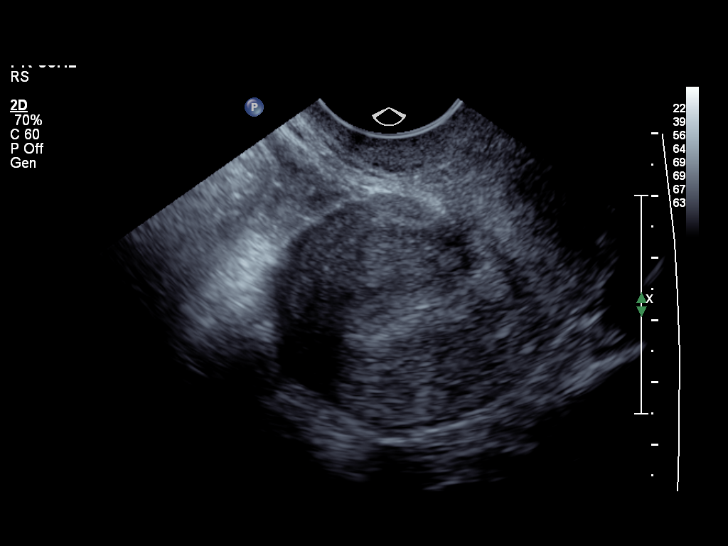

[13 of 25 positions shown; findings below may reference images not displayed]

FINDINGS: Uterus

Measurements: 8.1 x 3.9 x 5.8 cm. No fibroids or other mass
visualized.

Endometrium

Thickness: 2.1 mm.  No focal abnormality visualized.

Right ovary

Measurements: 3.6 x 1.9 x 2.4 cm. There is a simple appearing cyst
within the right ovary measuring 1.8 cm in greatest dimension.

Left ovary

Measurements: 1.9 x 2.3 x 2.3 cm. There is a cyst containing a small
septation measuring 1.9 x 1.7 x 2.1 cm.

Other findings

No free fluid.
IMPRESSION: 1. The uterus is normal in echotexture and contour and exhibits
normal endometrial stripe.
2. There is a simple appearing cyst in the right ovary measuring
cm.
3. There is a septated cyst in the left ovary measuring 2.1 cm in
greatest dimension.
4. Follow-up ultrasound in 6-12 weeks is recommended to reassess the
left ovarian cyst.

## 2015-08-19 ENCOUNTER — Encounter: Payer: Medicaid Other | Admitting: Internal Medicine

## 2015-08-19 ENCOUNTER — Other Ambulatory Visit: Payer: Self-pay | Admitting: Internal Medicine

## 2015-08-19 DIAGNOSIS — R87612 Low grade squamous intraepithelial lesion on cytologic smear of cervix (LGSIL): Secondary | ICD-10-CM

## 2015-08-20 ENCOUNTER — Telehealth: Payer: Self-pay | Admitting: Internal Medicine

## 2015-08-20 NOTE — Telephone Encounter (Signed)
Ms. [name] was suppose to have a clinic appointment with me on 11/2 that was cancelled. 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 She needs a referral to gynecology for colposcopy.   I called [name] and did not get an answer. Left her a voicemail with the results of her PAP smear and that we would need to have her see Gyn. Told her to call the clinic if she had any questions. Will go ahead and put the referral in today.

## 2015-10-02 ENCOUNTER — Encounter: Payer: Self-pay | Admitting: Obstetrics & Gynecology

## 2015-10-18 DIAGNOSIS — D069 Carcinoma in situ of cervix, unspecified: Secondary | ICD-10-CM

## 2015-10-18 HISTORY — DX: Carcinoma in situ of cervix, unspecified: D06.9

## 2015-10-29 ENCOUNTER — Encounter: Payer: Medicaid Other | Admitting: Obstetrics & Gynecology

## 2015-11-25 ENCOUNTER — Ambulatory Visit (INDEPENDENT_AMBULATORY_CARE_PROVIDER_SITE_OTHER): Payer: Medicaid Other | Admitting: Obstetrics & Gynecology

## 2015-11-25 ENCOUNTER — Encounter: Payer: Self-pay | Admitting: Obstetrics & Gynecology

## 2015-11-25 ENCOUNTER — Other Ambulatory Visit (HOSPITAL_COMMUNITY)
Admission: RE | Admit: 2015-11-25 | Discharge: 2015-11-25 | Disposition: A | Discharge status: Home / Self Care | Payer: Medicaid Other | Source: Ambulatory Visit | Attending: Obstetrics & Gynecology | Admitting: Obstetrics & Gynecology

## 2015-11-25 VITALS — BP 134/74 | HR 81 | Temp 98.1°F | Wt 149.0 lb

## 2015-11-25 DIAGNOSIS — N871 Moderate cervical dysplasia: Secondary | ICD-10-CM | POA: Diagnosis not present

## 2015-11-25 DIAGNOSIS — N870 Mild cervical dysplasia: Secondary | ICD-10-CM | POA: Diagnosis present

## 2015-11-25 DIAGNOSIS — IMO0002 Reserved for concepts with insufficient information to code with codable children: Secondary | ICD-10-CM

## 2015-11-25 DIAGNOSIS — R896 Abnormal cytological findings in specimens from other organs, systems and tissues: Secondary | ICD-10-CM | POA: Diagnosis present

## 2015-11-25 DIAGNOSIS — R87810 Cervical high risk human papillomavirus (HPV) DNA test positive: Secondary | ICD-10-CM | POA: Insufficient documentation

## 2015-11-25 NOTE — Patient Instructions (Signed)

## 2015-11-25 NOTE — Progress Notes (Signed)
Patient ID: NEISHA HINGER, female   DOB: 1976-07-14, 40 y.o.   MRN: [id number] Patient given informed consent, signed copy in the chart, time out was performed.  Placed in lithotomy position. Cervix viewed with speculum and colposcope after application of acetic acid.  03/18/2015 PAP: Adequacy Reason Satisfactory for evaluation, endocervical/transformation zone component PRESENT. Diagnosis LOW GRADE SQUAMOUS INTRAEPITHELIAL LESION: CIN-1/ HPV (LSIL). Source CervicoVaginal Pap [ThinPrep Imaged] Molecular Results Test Result HPV High Risk DETECTED  Colposcopy adequate?  yes Acetowhite lesions?yes Punctation?no Mosaicism?  no Abnormal vasculature?  no Biopsies?yes at 7 and 12 o' clock   ECC?yes  Patient was given post procedure instructions.  She will be called within 2 weeks for results.  Girl Schissler L. Harraway-Smith, M.D., [place]

## 2015-11-30 ENCOUNTER — Telehealth: Payer: Self-pay

## 2015-11-30 NOTE — Telephone Encounter (Signed)
Pt has been informed of lab results and she has been scheduled for a Leep procedure

## 2015-12-25 ENCOUNTER — Encounter: Payer: Medicaid Other | Admitting: Obstetrics and Gynecology

## 2016-01-01 ENCOUNTER — Encounter: Payer: Self-pay | Admitting: Internal Medicine

## 2016-01-01 ENCOUNTER — Ambulatory Visit (INDEPENDENT_AMBULATORY_CARE_PROVIDER_SITE_OTHER): Payer: Medicaid Other | Admitting: Internal Medicine

## 2016-01-01 VITALS — BP 141/72 | HR 88 | Temp 97.8°F | Resp 20 | Ht 61.5 in | Wt 147.0 lb

## 2016-01-01 DIAGNOSIS — M25531 Pain in right wrist: Secondary | ICD-10-CM | POA: Diagnosis not present

## 2016-01-01 DIAGNOSIS — M25539 Pain in unspecified wrist: Secondary | ICD-10-CM

## 2016-01-01 DIAGNOSIS — M25532 Pain in left wrist: Secondary | ICD-10-CM | POA: Diagnosis not present

## 2016-01-01 DIAGNOSIS — F17200 Nicotine dependence, unspecified, uncomplicated: Secondary | ICD-10-CM | POA: Diagnosis not present

## 2016-01-01 DIAGNOSIS — Z720 Tobacco use: Secondary | ICD-10-CM

## 2016-01-01 DIAGNOSIS — G8929 Other chronic pain: Secondary | ICD-10-CM | POA: Insufficient documentation

## 2016-01-01 DIAGNOSIS — M79603 Pain in arm, unspecified: Secondary | ICD-10-CM

## 2016-01-01 MED ORDER — VARENICLINE TARTRATE 0.5 MG X 11 & 1 MG X 42 PO MISC: ORAL | Status: DC

## 2016-01-01 NOTE — Assessment & Plan Note (Signed)
A: Her symptoms are consistent with carpal tunnel syndrome given that she frequently works with her hands and has some numbness in a median nerve distribution. We will initiate treatment with nightly wrist splinting and splinting during the day as she works. If her symptoms do not improve, she may need a nerve conduction study to confirm the diagnosis and possible surgical intervention.  P: Wrist splinting

## 2016-01-01 NOTE — Progress Notes (Signed)
Subjective:    Patient ID: [name] Montgomery, female    DOB: Feb 08, 1976, 40 y.o.   MRN: [id number]  HPI  [name] Montgomery is a nice lady who was in a normal state of health until approximately 1.5 years ago when both of her wrists started to hurt. However, she was able to work through the pain by self-medicating with Goody's powder, Tylenol, Ibuprofen. Nonetheless, her pain has worsened to the point where it is interfering with her work and sleep. 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 These painful episodes last approximately 20 minutes and occasionally she has to stop working. She will take a Goody powder and attempt to work again. She is unsure of when her back started to work as well. He work involves significant strenuous labor using multiple machines.  She "picks up boxes heavier tha [her]." She cannot sit or stand for long periods of time. If she stands, she "has to be moving." Her pain is also exacerbated at nighttime, and will keep her awake for hours. She also endorses numbness or tingling in her hands and legs. She also describes headaches that start bilaterally at the back of her neck and radiate to her eyes. They last 15 minutes and are alleviated with Excedrine migraine. She drinks 3 cups of coffee in the morning. She takes ibuprofen, tylenol, Excedrine, and Goody's every day. She complains of cold intolerance, dry skin, poor sleep, daytime tiredness, and hair loss. She also indicates that she "passed out" last week at work, and she does not recall the events. She does not recall any prodromal symptoms other than feeling hot.  [name] Montgomery smokes 1/2 ppd, down from a maximum of 2 packs per day. She has tried patches and gum in the past, but these ineffective. She is interested in trying varenicline, which she said worked before with no adverse effects.   Active Ambulatory Problems    Diagnosis Date Noted  . Tobacco abuse  01/12/2015  . COPD (chronic obstructive pulmonary disease) (HCC) 01/12/2015  . Preventative health care 01/12/2015  . Neck pain on left side 01/12/2015  . LLQ abdominal tenderness 01/27/2015  . Seasonal allergies 01/27/2015  . Ovarian cyst 02/20/2015  . Iron deficiency anemia 03/18/2015  . Abnormal menstrual periods 07/13/2015   Resolved Ambulatory Problems    Diagnosis Date Noted  . No Resolved Ambulatory Problems   Past Medical History  Diagnosis Date  . Asthma      Review of Systems  Constitutional: Positive for chills and fatigue. Negative for fever, diaphoresis and unexpected weight change.  HENT: Negative for congestion and sore throat.   Eyes: Negative for photophobia and visual disturbance.  Respiratory: Negative for cough and shortness of breath.   Cardiovascular: Negative for chest pain, palpitations and leg swelling.  Gastrointestinal: Positive for nausea. Negative for vomiting, abdominal pain, diarrhea and constipation.  Endocrine: Positive for cold intolerance, polydipsia and polyuria. Negative for polyphagia.  Genitourinary: Negative for dysuria and urgency.  Musculoskeletal: Positive for back pain. Negative for joint swelling and neck stiffness.  Skin: Negative for rash and wound.  Neurological: Positive for headaches. Negative for dizziness, tremors, speech difficulty and weakness.  Psychiatric/Behavioral: Positive for dysphoric mood. Negative for suicidal ideas.       Objective:   Physical Exam  Constitutional: She appears well-developed and well-nourished. No distress.  HENT:  Head: Normocephalic and atraumatic.  Mouth/Throat: Oropharynx is clear and moist.  No oropharyngeal exudate.  Eyes: Pupils are equal, round, and reactive to light. No scleral icterus.  Neck: Normal range of motion. Neck supple. No thyromegaly present.  Cardiovascular: Normal rate, regular rhythm and normal heart sounds.   Pulmonary/Chest: Effort normal and breath sounds normal. No  respiratory distress. She has no wheezes.  Abdominal: Soft. Bowel sounds are normal. She exhibits no distension. There is no tenderness.  Musculoskeletal: Normal range of motion. She exhibits no edema.  Full range of motion in wrists, but endorses pain during movement. Negative Phalen's sign. Straight leg raise on right elicits non-radiating pain in left lower back.   Lymphadenopathy:    She has no cervical adenopathy.  Neurological: She is alert. She has normal reflexes.  Diminished light touch sensation in a median nerve distribution bilaterally.  Skin: Skin is warm and dry. No rash noted. She is not diaphoretic.  Psychiatric:  Appears sluggish. Slow speech. PHQ9 score 22, no passive or active SI  Vitals reviewed.         Assessment & Plan:  Please see problem based A&P for details.

## 2016-01-01 NOTE — Patient Instructions (Addendum)
Ms. [name], it was a pleasure taking care of you today. It appears you may have carpal tunnel syndrome. Please pick up wrist splints that you will wear every night and at work when J. [employer]. [employer]neyyou're working with your hands. We will try this for three weeks to see if it is effective.  You are having rebound and caffeine withdrawal headaches from your Goody's powder and Excedrin Migraine usage. Please cut back or stop using Goody's powder. You should also try to cut back on Excedrin migraine You can take 600 mg of ibuprofen three times a day and Tylenol 650 mg three times a day. Your headaches will never improve if you do not cut back on these medications.  Congratulations on quitting smoking. We will prescribe Chantix to assist you with this.   Carpal Tunnel Syndrome Carpal tunnel syndrome is a condition that causes pain in your hand and arm. The carpal tunnel is a narrow area that is on the palm side of your wrist. Repeated wrist motion or certain diseases may cause swelling in the tunnel. This swelling can pinch the main nerve in the wrist (median nerve).  HOME CARE If You Have a Splint:  Wear it as told by your doctor. Remove it only as told by your doctor.  Loosen the splint if your fingers:  Become numb and tingle.  Turn blue and cold.  Keep the splint clean and dry. General Instructions  Take over-the-counter and prescription medicines only as told by your doctor.  Rest your wrist from any activity that may be causing your pain. If needed, talk to your employer about changes that can be made in your work, such as getting a wrist pad to use while typing.  If directed, apply ice to the painful area:  Put ice in a plastic bag.  Place a towel between your skin and the bag.  Leave the ice on for 20 minutes, 2-3 times per day.  Keep all follow-up visits as told by your doctor. This is important.  Do any exercises as told by your doctor, physical therapist, or occupational therapist. GET HELP  IF:  You have new symptoms.  Medicine does not help your pain.  Your symptoms get worse.   This information is not intended to replace advice given to you by your health care provider. Make sure you discuss any questions you have with your health care provider.   Document Released: 09/22/2011 Document Revised: 06/24/2015 Document Reviewed: 02/18/2015 Elsevier Interactive Patient Education [employer]2016 [employer] Inc.

## 2016-01-01 NOTE — Assessment & Plan Note (Addendum)
A: Patient is back to smoking 1 ppd. She had a lapse while on chantix due to issues with her family. She indicates that patches and gum do not work for her. She did not have any adverse effects. I counseled her for 5 minutes on the benefits of smoking cessation and smoking cessation strategies.   P: Will initiate Chantix starting pack and reassess at follow-up visit

## 2016-01-02 LAB — HEPATITIS C ANTIBODY

## 2016-01-05 LAB — CBC
HEMATOCRIT: 42.7 % (ref 34.0–46.6)
Hemoglobin: 13.5 g/dL (ref 11.1–15.9)
MCH: 29.5 pg (ref 26.6–33.0)
MCHC: 31.6 g/dL (ref 31.5–35.7)
MCV: 93 fL (ref 79–97)
Platelets: 333 10*3/uL (ref 150–379)
RBC: 4.57 x10E6/uL (ref 3.77–5.28)
RDW: 14.8 % (ref 12.3–15.4)
WBC: 8.3 10*3/uL (ref 3.4–10.8)

## 2016-01-05 LAB — HCV ANTIBODY: Hep C Virus Ab: 0.2 s/co ratio (ref 0.0–0.9)

## 2016-01-05 LAB — TSH: TSH: 1.18 u[IU]/mL (ref 0.450–4.500)

## 2016-01-05 LAB — VITAMIN B12: VITAMIN B 12: 360 pg/mL (ref 211–946)

## 2016-01-07 NOTE — Progress Notes (Signed)
Internal Medicine Clinic Attending  Case discussed with Dr. Ford at the time of the visit.  We reviewed the resident's history and exam and pertinent patient test results.  I agree with the assessment, diagnosis, and plan of care documented in the resident's note.  

## 2016-01-11 ENCOUNTER — Encounter: Payer: Medicaid Other | Admitting: Obstetrics & Gynecology

## 2016-01-14 ENCOUNTER — Encounter (HOSPITAL_COMMUNITY): Payer: Self-pay

## 2016-01-14 ENCOUNTER — Emergency Department (HOSPITAL_COMMUNITY)
Admission: EM | Admit: 2016-01-14 | Discharge: 2016-01-14 | Disposition: A | Discharge status: Home / Self Care | Payer: Medicaid Other | Attending: Physician Assistant | Admitting: Physician Assistant

## 2016-01-14 DIAGNOSIS — Y9389 Activity, other specified: Secondary | ICD-10-CM | POA: Insufficient documentation

## 2016-01-14 DIAGNOSIS — Z7951 Long term (current) use of inhaled steroids: Secondary | ICD-10-CM | POA: Insufficient documentation

## 2016-01-14 DIAGNOSIS — S0993XA Unspecified injury of face, initial encounter: Secondary | ICD-10-CM

## 2016-01-14 DIAGNOSIS — X58XXXA Exposure to other specified factors, initial encounter: Secondary | ICD-10-CM | POA: Diagnosis not present

## 2016-01-14 DIAGNOSIS — Z79899 Other long term (current) drug therapy: Secondary | ICD-10-CM | POA: Diagnosis not present

## 2016-01-14 DIAGNOSIS — J45909 Unspecified asthma, uncomplicated: Secondary | ICD-10-CM | POA: Diagnosis not present

## 2016-01-14 DIAGNOSIS — S025XXA Fracture of tooth (traumatic), initial encounter for closed fracture: Secondary | ICD-10-CM | POA: Insufficient documentation

## 2016-01-14 DIAGNOSIS — Y9289 Other specified places as the place of occurrence of the external cause: Secondary | ICD-10-CM | POA: Insufficient documentation

## 2016-01-14 DIAGNOSIS — Y998 Other external cause status: Secondary | ICD-10-CM | POA: Diagnosis not present

## 2016-01-14 DIAGNOSIS — F17210 Nicotine dependence, cigarettes, uncomplicated: Secondary | ICD-10-CM | POA: Diagnosis not present

## 2016-01-14 MED ORDER — IBUPROFEN 200 MG PO TABS: 600.0000 mg | ORAL_TABLET | Freq: Three times a day (TID) | ORAL | Status: DC | PRN

## 2016-01-14 MED ORDER — PENICILLIN V POTASSIUM 500 MG PO TABS: 500.0000 mg | ORAL_TABLET | Freq: Three times a day (TID) | ORAL | Status: DC

## 2016-01-14 NOTE — Discharge Instructions (Signed)

## 2016-01-14 NOTE — ED Notes (Signed)
Pt c/o broken L upper tooth starting this morning.  Pain score 8/10.  Pt reports taking Tylenol w/o relief.  Pt is followed by Stokes DDS.  Sts "I wasn't about to get a hold of him."

## 2016-01-14 NOTE — ED Provider Notes (Signed)
CSN: [id number]     Arrival date & time 01/14/16  1122 History  By signing my name below, I, [name], attest that this documentation has been prepared under the direction and in the presence of Fayrene Helper, PA-C. Electronically Signed: Phillis Montgomery, ED Scribe. 01/14/2016. 1:59 PM.  Chief Complaint  Patient presents with  . Dental Pain   The history is provided by the patient. No language interpreter was used.  HPI Comments: [name] is a 40 y.o. female who presents to the Emergency Department complaining of throbbing left upper dental pain onset earlier this morning. Pt states that she broke her tooth eating a granola bar and cheetos. She reports her pain is 9/10. Pt contacted her dentist but is unable to get an appointment until 02/01/16. She denies fever, chills, nausea, or vomiting.  Past Medical History  Diagnosis Date  . Asthma    Past Surgical History  Procedure Laterality Date  . Cesarean section     Family History  Problem Relation Age of Onset  . Diabetes Mother   . Diabetes Father    Social History  Substance Use Topics  . Smoking status: Current Every Day Smoker -- 0.50 packs/day    Types: Cigarettes  . Smokeless tobacco: None     Comment: trying to cut back - has tried Chantrix in past  . Alcohol Use: 0.0 oz/week    0 Standard drinks or equivalent per week   OB History    No data available     Review of Systems  Constitutional: Negative for fever and chills.  HENT: Positive for dental problem.   Gastrointestinal: Negative for nausea and vomiting.   Allergies  Review of patient's allergies indicates no known allergies.  Home Medications   Prior to Admission medications   Medication Sig Start Date End Date Taking? Authorizing Provider  acetaminophen (TYLENOL) 500 MG tablet Take 500 mg by mouth as needed. Reported on 11/25/2015    Historical Provider, MD  albuterol (PROVENTIL HFA;VENTOLIN HFA) 108 (90 BASE) MCG/ACT inhaler Inhale 1-2 puffs into the  lungs every 6 (six) hours as needed for wheezing or shortness of breath. Patient not taking: Reported on 11/25/2015 01/26/15   Yolanda Manges, DO  ferrous sulfate 325 (65 FE) MG tablet Take 325 mg by mouth daily with breakfast.    Historical Provider, MD  fluticasone (FLONASE) 50 MCG/ACT nasal spray Place 2 sprays into both nostrils daily. Patient not taking: Reported on 11/25/2015 01/27/15   Yolanda Manges, DO  ibuprofen (ADVIL,MOTRIN) 200 MG tablet Take 600 mg by mouth every 8 (eight) hours as needed.    Historical Provider, MD  Multiple Vitamin (MULTIVITAMIN WITH MINERALS) TABS Take 1 tablet by mouth daily. Reported on 11/25/2015    Historical Provider, MD  polyethylene glycol (MIRALAX / GLYCOLAX) packet Take 17 g by mouth daily. Patient not taking: Reported on 11/25/2015 07/13/15   Ejiroghene Wendall Stade, MD  varenicline (CHANTIX STARTING MONTH PAK) 0.5 MG X 11 & 1 MG X 42 tablet Take one 0.5 mg tablet by mouth once daily for 3 days, then one 0.5 mg tablet twice daily for 4 days, then one 1 mg tablet twice daily. 01/01/16   Ruben Im, MD   BP 117/75 mmHg  Pulse 79  Temp(Src) 98.1 F (36.7 C) (Oral)  Resp 18  SpO2 100%  LMP 12/28/2015 Physical Exam  Constitutional: She is oriented to person, place, and time. She appears well-developed and well-nourished. No distress.  HENT:  Head:  Normocephalic and atraumatic.  Mouth/Throat: Oropharynx is clear and moist. No oropharyngeal exudate.  Tooth #12 with partial avulsion fracture to the posterior aspect of the tooth involving 20% of the tooth; tenderness to palpation no gingival edema or erythema; significant dental decay throughout  Eyes: Conjunctivae and EOM are normal. Pupils are equal, round, and reactive to light.  Neck: Normal range of motion. Neck supple.  Musculoskeletal: Normal range of motion.  Neurological: She is alert and oriented to person, place, and time.  Skin: Skin is warm and dry.  Psychiatric: She has a normal mood and affect. Her  behavior is normal.    ED Course  Procedures (including critical care time) DIAGNOSTIC STUDIES: Oxygen Saturation is 100% on RA, normal by my interpretation.    COORDINATION OF CARE: 1:58 PM-Discussed treatment plan which includes anti-biotics with pt at bedside and pt agreed to plan.     MDM   Patient with toothache from a partial dental avulsion.  No gross abscess.  Exam unconcerning for Ludwig's angina or spread of infection.  Will treat with penicillin and pain medicine.  Urged patient to follow-up with dentist.    Final diagnoses:  Dental injury, initial encounter   BP 117/75 mmHg  Pulse 79  Temp(Src) 98.1 F (36.7 C) (Oral)  Resp 18  SpO2 100%  LMP 12/28/2015  I personally performed the services described in this documentation, which was scribed in my presence. The recorded information has been reviewed and is accurate.      Fayrene HelperBowie Kenzie Thoreson, PA-C 01/14/16 1404  Courteney Randall AnLyn Mackuen, MD 01/14/16 1614

## 2016-01-25 ENCOUNTER — Telehealth: Payer: Self-pay | Admitting: Internal Medicine

## 2016-01-25 NOTE — Telephone Encounter (Signed)
APPT. REMINDER CALL, LMTCB °

## 2016-01-26 ENCOUNTER — Encounter: Payer: Self-pay | Admitting: Internal Medicine

## 2016-01-26 ENCOUNTER — Ambulatory Visit: Payer: Medicaid Other | Admitting: Internal Medicine

## 2016-02-12 ENCOUNTER — Other Ambulatory Visit (HOSPITAL_COMMUNITY)
Admission: RE | Admit: 2016-02-12 | Discharge: 2016-02-12 | Disposition: A | Discharge status: Home / Self Care | Payer: Medicaid Other | Source: Ambulatory Visit | Attending: Family Medicine | Admitting: Family Medicine

## 2016-02-12 ENCOUNTER — Ambulatory Visit (INDEPENDENT_AMBULATORY_CARE_PROVIDER_SITE_OTHER): Payer: Medicaid Other | Admitting: Family Medicine

## 2016-02-12 ENCOUNTER — Encounter: Payer: Self-pay | Admitting: Family Medicine

## 2016-02-12 VITALS — BP 105/58 | HR 82 | Temp 98.5°F | Ht 60.75 in | Wt 141.9 lb

## 2016-02-12 DIAGNOSIS — Z3202 Encounter for pregnancy test, result negative: Secondary | ICD-10-CM

## 2016-02-12 DIAGNOSIS — D069 Carcinoma in situ of cervix, unspecified: Secondary | ICD-10-CM

## 2016-02-12 DIAGNOSIS — N871 Moderate cervical dysplasia: Secondary | ICD-10-CM | POA: Insufficient documentation

## 2016-02-12 LAB — POCT PREGNANCY, URINE: Preg Test, Ur: NEGATIVE

## 2016-02-12 NOTE — Progress Notes (Signed)
   LEEP PROCEDURE NOTE Pap smear and colposcopy reviewed.   Pap LSIL Colpo Biopsy Cin1 ECC CIN 2-3 Risks, benefits, alternatives, and limitations of procedure explained to patient, including pain, bleeding, infection, failure to remove abnormal tissue and failure to cure dysplasia, need for repeat procedures, damage to pelvic organs, cervical incompetence.  Role of HPV,cervical dysplasia and need for close followup was empasized. Informed written consent was obtained. All questions were answered. Time out performed.  Procedure: The patient was placed in lithotomy position and the bivalved coated speculum was placed in the patient's vagina. A grounding pad placed on the patient. Lugol's solution was applied to the cervix and areas of decreased uptake were noted 12 o'clock.   Local anesthesia was administered via an intracervical block using 10cc of 2% Lidocaine with epinephrine. The suction was turned on and the Extended Small 1X Fisher Cone Biopsy Excisor on 4350 Watts of cutting current was used to excise the area of decreased uptake and excise the entire transformation zone. Excellent hemostasis was achieved using roller ball coagulation set at 50 Watts coagulation current. Monsel's solution was then applied and the speculum was removed from the vagina. Specimens were sent to pathology. The patient tolerated the procedure well. Post-operative instructions given to patient, including instruction to seek medical attention for persistent bright red bleeding, fever, abdominal/pelvic pain, dysuria, nausea or vomiting. She was also told about the possibility of having copious yellow to black tinged discharge. She was counseled to avoid anything in the vagina (sex/douching/tampons) for 4-6 weeks.

## 2016-02-24 ENCOUNTER — Telehealth: Payer: Self-pay | Admitting: General Practice

## 2016-02-24 NOTE — Telephone Encounter (Signed)
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 Will need PAP with cotesting in 1 year. Called patient & informed her of results & recommendations. Patient verbalized understanding & had no questions

## 2016-03-18 ENCOUNTER — Encounter: Payer: Medicaid Other | Admitting: Internal Medicine

## 2016-03-24 ENCOUNTER — Encounter: Payer: Self-pay | Admitting: Internal Medicine

## 2016-03-24 ENCOUNTER — Ambulatory Visit (INDEPENDENT_AMBULATORY_CARE_PROVIDER_SITE_OTHER): Payer: Medicaid Other | Admitting: Internal Medicine

## 2016-03-24 VITALS — BP 134/69 | HR 90 | Temp 98.3°F | Wt 140.1 lb

## 2016-03-24 DIAGNOSIS — M79601 Pain in right arm: Secondary | ICD-10-CM

## 2016-03-24 DIAGNOSIS — M79602 Pain in left arm: Secondary | ICD-10-CM

## 2016-03-24 DIAGNOSIS — G8929 Other chronic pain: Secondary | ICD-10-CM | POA: Diagnosis not present

## 2016-03-24 MED ORDER — CYCLOBENZAPRINE HCL 5 MG PO TABS: 5.0000 mg | ORAL_TABLET | Freq: Three times a day (TID) | ORAL | Status: DC | PRN

## 2016-03-24 NOTE — Patient Instructions (Signed)
Take tylenol 1000mg  upto 3 times daily. Take ibuprofen 600mg  every every 4 hours. Take maximum 1 pack of goody's powder.  Follow up in 2 months.  Avoid activities that causes pain.

## 2016-03-24 NOTE — Progress Notes (Signed)
   Subjective:    Patient ID: [name], female    DOB: [birth year], 40 y.o.   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   pain is chronic, started at least 1.5 years ago. Lifts heavy boxes at work. Dr. Ala DachFord thought it was 2/2 to carpal tunnel syndrome. Recommended wrist splinting which she tried without any relief. Taking goody powder which helps.   Bilateral arm and wrist pain, starts at the wrist and goes up to the arm and shoulder. The pain that radiates up feels like someone is shocking her. R >L. Has some neck pain as well. Has numbness on ulnar side 4th and 5th fingers but also sometimes on 1st, 2nd, and 3rd digits. Had some hand swelling 2 days ago with redness.  Put some ice and it resolved. No sun sensitivities. Has stiffness on both hands in morning lasting more than 1 hour. Has some dry mouth.   HCV negative 2 months ago. B12 and TSH normal.   Mother has RA. She states someone tried to diagnose her with lupus but did not do any blood work and was told she does not have it. I can't find any record of this.   Review of Systems  Constitutional: Negative for fever, chills and unexpected weight change.  HENT: Negative for congestion.   Eyes: Negative for photophobia and visual disturbance.  Respiratory: Negative for cough, shortness of breath and wheezing.   Cardiovascular: Negative for chest pain and leg swelling.  Gastrointestinal: Negative for abdominal pain and abdominal distention.  Musculoskeletal: Positive for back pain, joint swelling, arthralgias and neck pain.  Neurological: Positive for numbness. Negative for dizziness, weakness and headaches.       Objective:   Physical Exam  Constitutional: She is oriented to person, place, and time. She appears well-developed and well-nourished.  HENT:  Head: Normocephalic and atraumatic.  Eyes: Conjunctivae are normal.  Right eye exhibits no discharge. Left eye exhibits no discharge.  Neck:  ROM is normal but has pain with neck movements.   Musculoskeletal:  Has spinous process tenderness throughout her entire spine.  Normal reflexes on both upper exts. Normal strength. Normal ROM of shoulder, elbow, and wrist.  No swelling. Normal pulses.  Negative spurling test. Neg tinnels.   Neurological: She is alert and oriented to person, place, and time.    Filed Vitals:   03/24/16 1001  BP: 134/69  Pulse: 90  Temp: 98.3 F (36.8 C)         Assessment & Plan:  See problem based a&p.

## 2016-03-24 NOTE — Assessment & Plan Note (Addendum)
Unclear etiology of chronic arm and wrist pain, with some numbness and shooting electrical pain up from her wrist towards her shoulder. Has neck pain and spinous process tenderness, normal reflexes. 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 No weight loss/fever. Does have morning stiffness of her hand and wrist lasting >1 hour.   This is likely 2/2 to overuse causing peripheral neuropathy. Unclear where the problem is. Could be 2/2 to brachial plexus syndrome vs ulnar nerve entrapment (cuboital tunnel syndrome) vs carpal tunnel syndrome. Also could be cervical radiculopathic pain.   - will do conservative management for now. Continue iburprofen + tylenol. Asked to reduce her goody's powder use. - f/up in 2 months. If not improving, may consider Nerve conduction test to locate where the source of the problem is. - will check RA and ANA.  Addendum: RA negative. ANA positive (did not have titer), SSA also positive. Will refer to rheumatology.

## 2016-03-25 LAB — ANA W/REFLEX IF POSITIVE
Anti JO-1: <0.2 AI (ref 0.0–0.9)
Anti Nuclear Antibody(ANA): POSITIVE — AB
Centromere Ab Screen: <0.2 AI (ref 0.0–0.9)
Chromatin Ab SerPl-aCnc: <0.2 AI (ref 0.0–0.9)
DSDNA AB: 1 [IU]/mL (ref 0–9)
ENA RNP Ab: <0.2 AI (ref 0.0–0.9)
ENA SSA (RO) Ab: >8 AI — ABNORMAL HIGH (ref 0.0–0.9)
ENA SSB (LA) Ab: 0.2 AI (ref 0.0–0.9)

## 2016-03-25 LAB — RHEUMATOID FACTOR: Rhuematoid fact SerPl-aCnc: <10 IU/mL (ref 0.0–13.9)

## 2016-03-25 NOTE — Progress Notes (Signed)
Internal Medicine Clinic Attending  I saw and evaluated the patient.  I personally confirmed the key portions of the history and exam documented by Dr. Tasia CatchingsAhmed and I reviewed pertinent patient test results.  The assessment, diagnosis, and plan were formulated together and I agree with the documentation in the resident's note.  Patient with radicular pain in her right arm, worse with activity, seemingly consistent with carpal tunnel syndrome or ulnar nerve entrapment. She also has neck and thoracic back pain. No red flags, no radicular pain with neck flexion. I can't elicit her arm pain with any cervical maneuvers, just local pain. The cause of her neck pain seems less clear, likely facet disease based on her description. I think next step if the pain persists despite conservative measures to do an MRI neck to rule out canal stenosis or spondylosis.

## 2016-03-28 NOTE — Addendum Note (Signed)
Addended by: Hyacinth MeekerAHMED, Jaiyanna Safran on: 03/28/2016 09:29 AM   Modules accepted: Orders

## 2016-04-22 ENCOUNTER — Encounter: Payer: Self-pay | Admitting: *Deleted

## 2016-05-18 ENCOUNTER — Encounter: Payer: Medicaid Other | Admitting: Internal Medicine

## 2016-07-06 ENCOUNTER — Encounter: Payer: Medicaid Other | Admitting: Internal Medicine

## 2016-11-15 ENCOUNTER — Telehealth: Payer: Self-pay | Admitting: Internal Medicine

## 2016-11-15 NOTE — Telephone Encounter (Signed)
APT. REMINDER CALL, LMTCB °

## 2016-11-16 ENCOUNTER — Ambulatory Visit (INDEPENDENT_AMBULATORY_CARE_PROVIDER_SITE_OTHER): Payer: Self-pay | Admitting: Internal Medicine

## 2016-11-16 DIAGNOSIS — Z8349 Family history of other endocrine, nutritional and metabolic diseases: Secondary | ICD-10-CM

## 2016-11-16 DIAGNOSIS — K117 Disturbances of salivary secretion: Secondary | ICD-10-CM

## 2016-11-16 DIAGNOSIS — M3500 Sicca syndrome, unspecified: Secondary | ICD-10-CM | POA: Insufficient documentation

## 2016-11-16 DIAGNOSIS — R1032 Left lower quadrant pain: Secondary | ICD-10-CM

## 2016-11-16 DIAGNOSIS — R1013 Epigastric pain: Secondary | ICD-10-CM

## 2016-11-16 DIAGNOSIS — F32A Depression, unspecified: Secondary | ICD-10-CM | POA: Insufficient documentation

## 2016-11-16 DIAGNOSIS — R109 Unspecified abdominal pain: Secondary | ICD-10-CM | POA: Insufficient documentation

## 2016-11-16 DIAGNOSIS — F17210 Nicotine dependence, cigarettes, uncomplicated: Secondary | ICD-10-CM

## 2016-11-16 DIAGNOSIS — F322 Major depressive disorder, single episode, severe without psychotic features: Secondary | ICD-10-CM | POA: Insufficient documentation

## 2016-11-16 DIAGNOSIS — Z8742 Personal history of other diseases of the female genital tract: Secondary | ICD-10-CM

## 2016-11-16 DIAGNOSIS — R1084 Generalized abdominal pain: Secondary | ICD-10-CM

## 2016-11-16 DIAGNOSIS — R1031 Right lower quadrant pain: Secondary | ICD-10-CM

## 2016-11-16 MED ORDER — SERTRALINE HCL 50 MG PO TABS: 50.0000 mg | ORAL_TABLET | Freq: Every day | ORAL | 1 refills | Status: DC

## 2016-11-16 NOTE — Assessment & Plan Note (Addendum)
Patient was tested last year with ANA positive, and a positive SSA Ro antibody of > 8.  She has symptoms of xerostomia and sicca. She has strong autoimmune history on her maternal side  She was referred to Rheum last year but was not able to go.  A dsDNA was negative.  Hepatitis C was negative. She also does have somejoint pain through.   A: Sjogrens syndrome- with mild symptoms   Plan -Explained symptomatic treatment including using lubricant eye drops like systane, staying hydrated, avoiding sugary beverages -Advised regular follow up with dentist -referred for opthalmology for full eye exam -referred for Rheumatology

## 2016-11-16 NOTE — Patient Instructions (Addendum)
Thank you for your visit today  Please start taking the sertraline medicine every day for your depression  Please stay well hydrated, and use lubricant eye drops like systane  Please continue to see your dentist  Please return to clinic if your belly pain persists and return in 6 weeks for your depression

## 2016-11-16 NOTE — Assessment & Plan Note (Signed)
Patient says that she has a lot of stressors right now including having to care for 4 kids, husband, mother. She has no interest in doing her daily activities and sometimes can not fall asleep. Her PHQ-9 scale was 24 today indicating severe depression. She has never been on any antidepressants. She would like to do a trial of medication. She denies any SI.  Her TSh and Vitamin b12 were checked last year and were WNL.   A: Severe untreated depression Plan -Start Sertraline daily  -RTC in 6 weeks to assess her symptoms

## 2016-11-16 NOTE — Assessment & Plan Note (Addendum)
Patient says that she is having abdominal pain for 3 weeks which she describes as constant and diffuse and it radiates to the back. She says part of this may be her depression. Sitting down makes the pain worse, and laying down makes it better. She has had history of LLQ pain in the past and it was attributed to ovarian cyst- she thinks it may be the reason but the pain is different in the sense that it is diffuse.  She denies any urinary symptoms. She denies any nausea, vomiting, diarrhea or constipation. On exam, LLQ and RLQ tender to palpation as well as epigastric area. No CVAT.  A- broad differential- most likely due to recurrent ovarian cyst. But other etiologies can not be excluded like pancreatitis, a UTI or liver.  Plan-  -Check CBC, CMET, Lipase, and a UA -If unremarkable, then order TVUS  Addendum: called patient at 8 PM on 11/17/2016 informing that her CBC, CMET, lipase and UA were unremarkable. SHe says she is having similar kind of abdominal pain. Advised her to come back to the clinic and we can evaluate further with ultrasound TVUS as she has history of ovarian cyst

## 2016-11-16 NOTE — Progress Notes (Signed)
   CC: severe depression, Sjogrens, and abdominal pain  HPI: [name] is a 41 y.o. woman with PMH noted below for severe depression, Sjogrens and abdominal pain   Please see Problem List/A&P for the status of the patient's chronic medical problems   Past Medical History:  Diagnosis Date  . Asthma   . Carpal tunnel syndrome     Review of Systems: Denies fevers, chills, weight loss Denies cough, SOB Denies nausea, vomiting. Has abd pain for 3 weeks. Denies diarrhea, constipation.  Denies myalgias, but has some joint pain.  Denies rash Denies tingling or numbness  Has xerostomia and sicca Has poor dentition  Physical Exam: Vitals:   11/16/16 1606  BP: 121/63  Pulse: 84  Temp: 97.8 F (36.6 C)  TempSrc: Oral  SpO2: 100%  Weight: 148 lb 8 oz (67.4 kg)  Height: 5' 1.5" (1.562 m)    General: A&O, in NAD HEENT: MMM, poor dentition , eyes are moist and No redness. Neck: supple, midline trachea,  No lymphadenopathy  CV: RRR, normal s1, s2, no m/r/g, Resp: equal and symmetric breath sounds, no wheezing or crackles  Abdomen:  Tender to palpation in RLQ and LLQ, as well as epigastric area. Nontender over RUQ and LUQ.  Positive bowel sounds. Nondistended  Skin: warm, dry, intact, no rash over open body. Has stretch marks over abdomen.  Extremities: No edema.  No joint hypertrophy. No swelling of DIP or PIP joints. But right 2nd DIp joint tender to palpation.      Assessment & Plan:   See encounters tab for problem based medical decision making. Patient discussed with Dr. Criselda PeachesMullen

## 2016-11-17 LAB — CBC
Hematocrit: 37.5 % (ref 34.0–46.6)
Hemoglobin: 12 g/dL (ref 11.1–15.9)
MCH: 28.2 pg (ref 26.6–33.0)
MCHC: 32 g/dL (ref 31.5–35.7)
MCV: 88 fL (ref 79–97)
PLATELETS: 283 10*3/uL (ref 150–379)
RBC: 4.26 x10E6/uL (ref 3.77–5.28)
RDW: 16.7 % — AB (ref 12.3–15.4)
WBC: 7.9 10*3/uL (ref 3.4–10.8)

## 2016-11-17 LAB — CMP14 + ANION GAP
ALK PHOS: 78 IU/L (ref 39–117)
ALT: 18 IU/L (ref 0–32)
AST: 10 IU/L (ref 0–40)
Albumin/Globulin Ratio: 1.6 (ref 1.2–2.2)
Albumin: 3.9 g/dL (ref 3.5–5.5)
Anion Gap: 15 mmol/L (ref 10.0–18.0)
BILIRUBIN TOTAL: 0.2 mg/dL (ref 0.0–1.2)
BUN/Creatinine Ratio: 22 (ref 9–23)
BUN: 12 mg/dL (ref 6–20)
CHLORIDE: 99 mmol/L (ref 96–106)
CO2: 25 mmol/L (ref 18–29)
CREATININE: 0.54 mg/dL — AB (ref 0.57–1.00)
Calcium: 8.9 mg/dL (ref 8.7–10.2)
GFR calc Af Amer: 137 mL/min/{1.73_m2} (ref >59)
GFR calc non Af Amer: 119 mL/min/{1.73_m2} (ref >59)
GLOBULIN, TOTAL: 2.4 g/dL (ref 1.5–4.5)
Glucose: 97 mg/dL (ref 65–99)
POTASSIUM: 3.8 mmol/L (ref 3.5–5.2)
SODIUM: 139 mmol/L (ref 134–144)
Total Protein: 6.3 g/dL (ref 6.0–8.5)

## 2016-11-17 LAB — URINALYSIS, ROUTINE W REFLEX MICROSCOPIC
BILIRUBIN UA: NEGATIVE
GLUCOSE, UA: NEGATIVE
Ketones, UA: NEGATIVE
Leukocytes, UA: NEGATIVE
Nitrite, UA: NEGATIVE
PH UA: 6.5 (ref 5.0–7.5)
Protein, UA: NEGATIVE
RBC, UA: NEGATIVE
SPEC GRAV UA: 1.01 (ref 1.005–1.030)
Urobilinogen, Ur: 1 mg/dL (ref 0.2–1.0)

## 2016-11-17 LAB — LIPASE: LIPASE: 17 U/L (ref 14–72)

## 2016-11-17 NOTE — Progress Notes (Signed)
Internal Medicine Clinic Attending  Case discussed with Dr. Saraiya soon after the resident saw the patient.  We reviewed the resident's history and exam and pertinent patient test results.  I agree with the assessment, diagnosis, and plan of care documented in the resident's note.  

## 2016-11-19 ENCOUNTER — Other Ambulatory Visit: Payer: Self-pay | Admitting: Internal Medicine

## 2016-11-19 DIAGNOSIS — J302 Other seasonal allergic rhinitis: Secondary | ICD-10-CM

## 2016-11-19 DIAGNOSIS — Z720 Tobacco use: Secondary | ICD-10-CM

## 2016-11-22 ENCOUNTER — Telehealth: Payer: Self-pay

## 2016-11-22 NOTE — Telephone Encounter (Signed)
Will fill for one month but patient has cancelled or no showed several appointments with me and only presents to acute care clinic. We are unable to address her chronic medical problems and health care needs solely through the acute care clinic. She must be seen by me or if she cannot make Wednesday afternoon appointments for some reason be reassigned to another resident as PCP that she could see in clinic.

## 2016-11-22 NOTE — Telephone Encounter (Signed)
Call to patient no answer  left message to advise her that MD will not refill meds after this month if she is not seen by her PCP

## 2016-11-22 NOTE — Telephone Encounter (Signed)
Patient indicated on 1/31 that she was not taking this medication and has not been seen for follow up since it was prescribed 12/2015. Will not refill at this time.

## 2016-11-23 NOTE — Telephone Encounter (Signed)
Patient is not able to see PCP until 01/04/17 @ 2:45 pm.  That is Dr. Bonney RousselBoswell's first available slot.  Appt has been made and mailed.

## 2016-11-24 ENCOUNTER — Encounter: Payer: Self-pay | Admitting: *Deleted

## 2016-12-13 ENCOUNTER — Other Ambulatory Visit: Payer: Self-pay | Admitting: Internal Medicine

## 2016-12-13 DIAGNOSIS — M79601 Pain in right arm: Principal | ICD-10-CM

## 2016-12-13 DIAGNOSIS — M79602 Pain in left arm: Principal | ICD-10-CM

## 2016-12-13 DIAGNOSIS — G8929 Other chronic pain: Secondary | ICD-10-CM

## 2016-12-13 NOTE — Telephone Encounter (Signed)
I do not see any notes as to why the patient would be on Flexeril and it looks like she had a few prescriptions from the ED in 2014/2015 for Flexeril but none since. I have never evaluated the patient and with no clear reason for the flexeril, I will not prescribe it. She has an appointment scheduled with me 3/21.

## 2016-12-13 NOTE — Telephone Encounter (Signed)
cyclobenzaprine (FLEXERIL) 5 MG tablet [facility] [address]

## 2016-12-13 NOTE — Telephone Encounter (Signed)
Called pt, informed her of decision and reminded her of appt

## 2016-12-22 NOTE — Addendum Note (Signed)
Addended by: Neomia DearPOWERS, Cherokee Clowers E on: 12/22/2016 08:35 PM   Modules accepted: Orders

## 2017-01-04 ENCOUNTER — Encounter: Payer: Self-pay | Admitting: Internal Medicine

## 2017-01-04 ENCOUNTER — Encounter: Payer: Medicaid Other | Admitting: Internal Medicine

## 2017-02-15 ENCOUNTER — Encounter: Payer: Medicaid Other | Admitting: Internal Medicine

## 2017-03-29 ENCOUNTER — Encounter: Payer: Self-pay | Admitting: Internal Medicine

## 2017-05-31 ENCOUNTER — Encounter: Payer: Medicaid Other | Admitting: Internal Medicine

## 2018-04-23 ENCOUNTER — Encounter (HOSPITAL_COMMUNITY): Payer: Self-pay | Admitting: Emergency Medicine

## 2018-04-23 ENCOUNTER — Other Ambulatory Visit: Payer: Self-pay

## 2018-04-23 ENCOUNTER — Emergency Department (HOSPITAL_COMMUNITY)
Admission: EM | Admit: 2018-04-23 | Discharge: 2018-04-24 | Disposition: A | Discharge status: Home / Self Care | Payer: Self-pay | Attending: Emergency Medicine | Admitting: Emergency Medicine

## 2018-04-23 DIAGNOSIS — F17210 Nicotine dependence, cigarettes, uncomplicated: Secondary | ICD-10-CM | POA: Insufficient documentation

## 2018-04-23 DIAGNOSIS — L0291 Cutaneous abscess, unspecified: Secondary | ICD-10-CM

## 2018-04-23 DIAGNOSIS — Z79899 Other long term (current) drug therapy: Secondary | ICD-10-CM | POA: Insufficient documentation

## 2018-04-23 DIAGNOSIS — J45909 Unspecified asthma, uncomplicated: Secondary | ICD-10-CM | POA: Insufficient documentation

## 2018-04-23 DIAGNOSIS — L02214 Cutaneous abscess of groin: Secondary | ICD-10-CM | POA: Insufficient documentation

## 2018-04-23 NOTE — ED Triage Notes (Signed)
Pt c/o pain surrounding ?ingrown hair/abscess to R groin x 2 days. Denies drainage.

## 2018-04-24 MED ORDER — LIDOCAINE-EPINEPHRINE (PF) 2 %-1:200000 IJ SOLN
10.0000 mL | Freq: Once | INTRAMUSCULAR | Status: AC
  Administered 2018-04-24: 10 mL

## 2018-04-24 MED ORDER — CLINDAMYCIN HCL 300 MG PO CAPS: 300.0000 mg | ORAL_CAPSULE | Freq: Three times a day (TID) | ORAL | 0 refills | Status: DC

## 2018-04-24 NOTE — ED Provider Notes (Signed)
[facility] EMERGENCY DEPARTMENT Provider Note   CSN: [id number] Arrival date & time: 04/23/18  2320     History   Chief Complaint Chief Complaint  Patient presents with  . Abscess    HPI [name] is a 42 y.o. female.  HPI 42 year old Caucasian female with no pertinent past medical history presents to the ED for abscess.  Patient reports abscess to her right inner groin.  States is been ongoing for the past 2 days.  Reports that drainage just started on arrival to the ED.  Denies any associated fevers, chills, nausea or vomiting.  No history of diabetes or recurrent abscesses.  Patient reports pain with palpation over the area.  He has not taken anything for her symptoms prior to arrival.  Nothing makes better or worse. Past Medical History:  Diagnosis Date  . Asthma   . Carpal tunnel syndrome     Patient Active Problem List   Diagnosis Date Noted  . Abdominal pain 11/16/2016  . Sjogren's syndrome (HCC) 11/16/2016  . Severe depression (HCC) 11/16/2016  . Chronic arm pain 01/01/2016  . Abnormal menstrual periods 07/13/2015  . Iron deficiency anemia 03/18/2015  . Ovarian cyst 02/20/2015  . LLQ abdominal tenderness 01/27/2015  . Seasonal allergies 01/27/2015  . Tobacco abuse 01/12/2015  . COPD (chronic obstructive pulmonary disease) (HCC) 01/12/2015  . Preventative health care 01/12/2015  . Neck pain on left side 01/12/2015    Past Surgical History:  Procedure Laterality Date  . CESAREAN SECTION    . Tubal ligation       OB History    Gravida  4   Para  2   Term  1   Preterm  1   AB  2   Living        SAB  2   TAB      Ectopic      Multiple      Live Births               Home Medications    Prior to Admission medications   Medication Sig Start Date End Date Taking? Authorizing Provider  acetaminophen (TYLENOL) 500 MG tablet Take 500 mg by mouth as needed. Reported on 11/25/2015    [provider]    clindamycin (CLEOCIN) 300 MG capsule Take 1 capsule (300 mg total) by mouth 3 (three) times daily. 04/24/18   Rise Mu, PA-C  cyclobenzaprine (FLEXERIL) 5 MG tablet Take 1 tablet (5 mg total) by mouth 3 (three) times daily as needed for muscle spasms. Patient not taking: Reported on 11/16/2016 03/24/16   Hyacinth Meeker, MD  ferrous sulfate 325 (65 FE) MG tablet Take 325 mg by mouth daily with breakfast.    [provider]  fluticasone (FLONASE) 50 MCG/ACT nasal spray instill 2 sprays into each nostril once daily 11/22/16   Valentino Nose, MD  HYDROcodone-acetaminophen [facility]) 10-325 MG tablet Take 1 tablet by mouth every 4 (four) hours as needed. 02/03/16   [provider]  ibuprofen (ADVIL,MOTRIN) 200 MG tablet Take 3 tablets (600 mg total) by mouth every 8 (eight) hours as needed. 01/14/16   Fayrene Helper, PA-C  Multiple Vitamin (MULTIVITAMIN WITH MINERALS) TABS Take 1 tablet by mouth daily. Reported on 11/25/2015    [provider]  polyethylene glycol (MIRALAX / GLYCOLAX) packet Take 17 g by mouth daily. 07/13/15   Emokpae, Ejiroghene E, MD  PROAIR HFA 108 (90 Base) MCG/ACT inhaler INHALE 1 TO 2  PUFFS INTO LUNGS EVERY 6 HOURS AS NEEDED FOR WHEEZING OR SHORTNESS OF BREATH 11/22/16   Valentino NoseBoswell, Nathan, MD  sertraline (ZOLOFT) 50 MG tablet Take 1 tablet (50 mg total) by mouth daily. 11/16/16 11/16/17  Deneise LeverSaraiya, Parth, MD  varenicline (CHANTIX STARTING MONTH PAK) 0.5 MG X 11 & 1 MG X 42 tablet Take one 0.5 mg tablet by mouth once daily for 3 days, then one 0.5 mg tablet twice daily for 4 days, then one 1 mg tablet twice daily. Patient not taking: Reported on 11/16/2016 01/01/16   Ruben ImFord, Jeremy, MD    Family History Family History  Problem Relation Age of Onset  . Diabetes Mother   . Diabetes Father     Social History Social History   Tobacco Use  . Smoking status: Current Every Day Smoker    Packs/day: 0.50    Types: Cigarettes  . Smokeless tobacco: Never Used  . Tobacco  comment: trying to cut back - on Chantrix.  Substance Use Topics  . Alcohol use: Yes    Alcohol/week: 0.0 oz    Comment: Occasionally.  . Drug use: No     Allergies   Patient has no known allergies.   Review of Systems Review of Systems  Constitutional: Negative for chills and fever.  Gastrointestinal: Negative for vomiting.  Skin: Positive for wound.     Physical Exam Updated Vital Signs BP 120/81 (BP Location: Right Arm)   Pulse 89   Temp 98.5 F (36.9 C) (Oral)   Resp 16   LMP 04/16/2018   SpO2 98%   Physical Exam  Constitutional: She appears well-developed and well-nourished. No distress.  HENT:  Head: Normocephalic and atraumatic.  Eyes: Right eye exhibits no discharge. Left eye exhibits no discharge. No scleral icterus.  Neck: Normal range of motion.  Pulmonary/Chest: No respiratory distress.  Musculoskeletal: Normal range of motion.  Neurological: She is alert.  Skin: Skin is warm and dry. Capillary refill takes less than 2 seconds. No pallor.  Patient has approximately 1 x 1 cm area of fluctuance to the right inner groin over the mons pubis.  There is minimal surrounding erythema.  Purulent drainage is noted.  Tender to palpation.  There is induration around the area of fluctuance.    Psychiatric: Her behavior is normal. Judgment and thought content normal.  Nursing note and vitals reviewed.    ED Treatments / Results  Labs (all labs ordered are listed, but only abnormal results are displayed) Labs Reviewed - No data to display  EKG None  Radiology No results found.  Procedures .Marland Kitchen.Incision and Drainage Date/Time: 04/24/2018 3:34 AM Performed by: Rise MuLeaphart, [place] Caslin T, PA-C Authorized by: Rise MuLeaphart, Malikhi Ogan T, PA-C   Consent:    Consent obtained:  Verbal   Consent given by:  Patient   Risks discussed:  Bleeding, damage to other organs, infection, incomplete drainage and pain   Alternatives discussed:  No treatment Location:    Type:  Abscess    Size:  1cm   Location:  Anogenital   Anogenital location: Right inner groin. Pre-procedure details:    Skin preparation:  Antiseptic wash and Betadine Anesthesia (see MAR for exact dosages):    Anesthesia method:  Local infiltration   Local anesthetic:  Lidocaine 1% WITH epi Procedure type:    Complexity:  Simple Procedure details:    Needle aspiration: yes     Incision types:  Cruciate   Scalpel blade:  11   Wound management:  Probed and deloculated and irrigated  with saline   Drainage:  Bloody and purulent   Drainage amount:  Scant   Wound treatment:  Wound left open   Packing materials:  1/4 in iodoform gauze   Amount 1/4" iodoform:  1 Post-procedure details:    Patient tolerance of procedure:  Tolerated well, no immediate complications   (including critical care time)  Medications Ordered in ED Medications  lidocaine-EPINEPHrine (XYLOCAINE W/EPI) 2 %-1:200000 (PF) injection 10 mL (has no administration in time range)     Initial Impression / Assessment and Plan / ED Course  I have reviewed the triage vital signs and the nursing notes.  Pertinent labs & imaging results that were available during my care of the patient were reviewed by me and considered in my medical decision making (see chart for details).     Patient with skin abscess amenable to incision and drainage.  Small amount of packing was placed.  Wound recheck in 2 days. Encouraged home warm soaks and flushing.  In location with the purulent drainage antibiotics was initiated including clindamycin.  Patient encouraged to follow-up for recheck.  Discussed symptom Medicare at home.  Denies systemic symptoms of vital signs reassuring.  Pt is hemodynamically stable, in NAD, & able to ambulate in the ED. Evaluation does not show pathology that would require ongoing emergent intervention or inpatient treatment. I explained the diagnosis to the patient. Pain has been managed & has no complaints prior to dc. Pt is  comfortable with above plan and is stable for discharge at this time. All questions were answered prior to disposition. Strict return precautions for f/u to the ED were discussed. Encouraged follow up with PCP.    Final Clinical Impressions(s) / ED Diagnoses   Final diagnoses:  Abscess    ED Discharge Orders        Ordered    clindamycin (CLEOCIN) 300 MG capsule  3 times daily     04/24/18 0330       Rise Mu, PA-C 04/24/18 0335    Gilda Crease, MD 04/24/18 [phone]

## 2018-04-24 NOTE — Discharge Instructions (Addendum)
You have been treated for an abscess in the ED.  ° °There are sign of surrounding infection. Please take all of your antibiotics until finished!   You may develop abdominal discomfort or diarrhea from the antibiotic. You may help offset this with probiotics which you can buy or get in yogurt.  ° °May take tylenol and motrin as needed for pain. Warm compress to the area to help with the healing process. Avoid swimming for 1 week. May soak in warm water to help with pain and the healing process.  ° °Follow up with your doctor, an urgent care, or return to ED in order to remove your packing in 48-72 hours. If you do not have packing return in 48-72 hours for wound recheck. Return to the emergency department if you develop a fever, your abscess appears to become more infected (growing surrounding redness and warmth), new or worsening symptoms develop, any additional concerns.  ° °Abscess °An abscess (boil or furuncle) is an infected area that contains a collection of pus.  ° °SYMPTOMS °Signs and symptoms of an abscess include pain, tenderness, redness, or hardness. You may feel a moveable soft area under your skin. An abscess can occur anywhere in the body.  ° °TREATMENT  °A surgical cut (incision) may be made over your abscess to drain the pus. Gauze may be packed into the space or a drain may be looped through the abscess cavity (pocket). This provides a drain that will allow the cavity to heal from the inside outwards. The abscess may be painful for a few days, but should feel much better if it was drained.  °Your abscess, if seen early, may not have localized and may not have been drained. If not, another appointment may be required if it does not get better on its own or with medications. ° °HOME CARE INSTRUCTIONS  °Keep the skin and clothes clean around your abscess.  °If the abscess was drained, you will need to use gauze dressing to collect any draining pus. Dressings will typically need to be changed 3 or more  times a day.  °The infection may spread by skin contact with others. Avoid skin contact as much as possible.  °Practice good hygiene. This includes regular hand washing, cover any draining skin lesions, and do not share personal care items.  °SEEK MEDICAL CARE IF:  °You develop increased pain, swelling, redness, drainage, or bleeding in the wound site.  °You develop signs of generalized infection including muscle aches, chills, fever, or a general ill feeling.  °You have an oral temperature above 102° F (38.9° C).  °MAKE SURE YOU:  °Understand these instructions.  °Will watch your condition.  °Will get help right away if you are not doing well or get worse.  °Document Released: 07/13/2005 Document Revised: 06/15/2011 Document Reviewed: 05/06/2008 °[employer]® Patient Information ©2012 [employer]. ° ° ° °

## 2018-09-20 ENCOUNTER — Emergency Department (HOSPITAL_COMMUNITY)
Admission: EM | Admit: 2018-09-20 | Discharge: 2018-09-20 | Disposition: A | Discharge status: Home / Self Care | Payer: Self-pay | Attending: Emergency Medicine | Admitting: Emergency Medicine

## 2018-09-20 ENCOUNTER — Other Ambulatory Visit: Payer: Self-pay

## 2018-09-20 ENCOUNTER — Encounter (HOSPITAL_COMMUNITY): Payer: Self-pay | Admitting: Emergency Medicine

## 2018-09-20 DIAGNOSIS — T148XXA Other injury of unspecified body region, initial encounter: Secondary | ICD-10-CM

## 2018-09-20 DIAGNOSIS — G43909 Migraine, unspecified, not intractable, without status migrainosus: Secondary | ICD-10-CM | POA: Insufficient documentation

## 2018-09-20 DIAGNOSIS — G43809 Other migraine, not intractable, without status migrainosus: Secondary | ICD-10-CM

## 2018-09-20 DIAGNOSIS — F17210 Nicotine dependence, cigarettes, uncomplicated: Secondary | ICD-10-CM | POA: Insufficient documentation

## 2018-09-20 DIAGNOSIS — Z79899 Other long term (current) drug therapy: Secondary | ICD-10-CM | POA: Insufficient documentation

## 2018-09-20 MED ORDER — NAPROXEN 500 MG PO TABS
500.0000 mg | ORAL_TABLET | Freq: Once | ORAL | Status: AC
  Administered 2018-09-20: 500 mg via ORAL
  Filled 2018-09-20: qty 1

## 2018-09-20 MED ORDER — NAPROXEN 500 MG PO TABS: 500.0000 mg | ORAL_TABLET | Freq: Two times a day (BID) | ORAL | 0 refills | Status: DC | PRN

## 2018-09-20 MED ORDER — METHOCARBAMOL 500 MG PO TABS: 500.0000 mg | ORAL_TABLET | Freq: Every evening | ORAL | 0 refills | Status: DC | PRN
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
## 2018-09-20 NOTE — ED Notes (Signed)
Bed: WTR8 Expected date:  Expected time:  Means of arrival:  Comments: 
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
## 2018-09-20 NOTE — Discharge Instructions (Addendum)
Take naproxen 2 times a day with meals.  Do not take other anti-inflammatories at the same time (Advil, Motrin, ibuprofen, Aleve). You may supplement with Tylenol if you need further pain control. Use robaxin as needed for muscle stiffness or soreness.  Have caution, this may make you tired or groggy.  Do not drive or operate heavy machinery while taking this medicine. Use ice packs or heating pads if this helps control your pain. Use muscle creams and stretches to help control your symptoms.  You will likely have continued muscle stiffness and soreness over the next couple days.  Follow-up in 1 week if your symptoms are not improving. Return to the emergency room if you develop vision changes, vomiting, slurred speech, numbness, loss of bowel or bladder control, or any new or worsening symptoms.

## 2018-10-31 ENCOUNTER — Emergency Department (HOSPITAL_COMMUNITY)
Admission: EM | Admit: 2018-10-31 | Discharge: 2018-10-31 | Disposition: A | Discharge status: Home / Self Care | Payer: Self-pay | Attending: Emergency Medicine | Admitting: Emergency Medicine

## 2018-10-31 ENCOUNTER — Other Ambulatory Visit: Payer: Self-pay

## 2018-10-31 DIAGNOSIS — Z79899 Other long term (current) drug therapy: Secondary | ICD-10-CM | POA: Insufficient documentation

## 2018-10-31 DIAGNOSIS — K0889 Other specified disorders of teeth and supporting structures: Secondary | ICD-10-CM | POA: Insufficient documentation

## 2018-10-31 DIAGNOSIS — F17210 Nicotine dependence, cigarettes, uncomplicated: Secondary | ICD-10-CM | POA: Insufficient documentation

## 2018-10-31 DIAGNOSIS — J449 Chronic obstructive pulmonary disease, unspecified: Secondary | ICD-10-CM | POA: Insufficient documentation

## 2018-10-31 MED ORDER — AMOXICILLIN 500 MG PO CAPS: 500.0000 mg | ORAL_CAPSULE | Freq: Two times a day (BID) | ORAL | 0 refills | Status: AC

## 2018-10-31 MED ORDER — HYDROCODONE-ACETAMINOPHEN 5-325 MG PO TABS: 1.0000 | ORAL_TABLET | Freq: Four times a day (QID) | ORAL | 0 refills | Status: AC | PRN

## 2018-10-31 MED ORDER — NAPROXEN 500 MG PO TABS: 500.0000 mg | ORAL_TABLET | Freq: Two times a day (BID) | ORAL | 0 refills | Status: AC

## 2018-10-31 NOTE — Discharge Instructions (Signed)
Evaluated today for dental pain.  There is no evidence of abscess to drain on your exam.  He did have mild redness to your gums on the top.  I prescribed antibiotics as well as pain medication.  Please take as prescribed.  Please do not drive as this medications may make you sleepy.  Follow-up with dentistry.  I have listed resources on your discharge paperwork.  You will need to call them to schedule an appointment.

## 2018-10-31 NOTE — ED Provider Notes (Signed)
[facility] EMERGENCY [facility]MENT Provider Note   CSN: [id number] Arrival date & time: 10/31/18  1131   History   Chief Complaint Chief Complaint  Patient presents with  . Dental Pain    HPI [name] is a 43 y.o. female with no significant past medical history who presents for evaluation of dental pain.  Patient states she has had dental pain x2 days.  States pain is located to her upper and lower right molars.  Patient states she does have a history of previous dental abscesses as well as multiple dental caries.  Patient states she cannot afford to follow-up with dentistry.  Has been taking Tylenol and ibuprofen without relief of her symptoms.  Rates her pain a 6/10.  Pain does not radiate.  Denies any drainage from her gingiva.  Denies aggravating or alleviating factors.  Denies fever, chills, nausea, vomiting, neck pain, neck stiffness, decreased p.o. intake, chest pain, shortness of breath.   History obtained from patient. No interpretor was used.  HPI  Past Medical History:  Diagnosis Date  . Asthma   . Carpal tunnel syndrome     Patient Active Problem List   Diagnosis Date Noted  . Abdominal pain 11/16/2016  . Sjogren's syndrome (HCC) 11/16/2016  . Severe depression (HCC) 11/16/2016  . Chronic arm pain 01/01/2016  . Abnormal menstrual periods 07/13/2015  . Iron deficiency anemia 03/18/2015  . Ovarian cyst 02/20/2015  . LLQ abdominal tenderness 01/27/2015  . Seasonal allergies 01/27/2015  . Tobacco abuse 01/12/2015  . COPD (chronic obstructive pulmonary disease) (HCC) 01/12/2015  . Preventative health care 01/12/2015  . Neck pain on left side 01/12/2015    Past Surgical History:  Procedure Laterality Date  . CESAREAN SECTION    . Tubal ligation       OB History    Gravida  4   Para  2   Term  1   Preterm  1   AB  2   Living        SAB  2   TAB      Ectopic      Multiple      Live Births               Home  Medications    Prior to Admission medications   Medication Sig Start Date End Date Taking? Authorizing Provider  acetaminophen (TYLENOL) 500 MG tablet Take 500 mg by mouth as needed. Reported on 11/25/2015    [provider]  amoxicillin (AMOXIL) 500 MG capsule Take 1 capsule (500 mg total) by mouth 2 (two) times daily for 5 days. 10/31/18 11/05/18  Caidyn Henricksen A, PA-C  clindamycin (CLEOCIN) 300 MG capsule Take 1 capsule (300 mg total) by mouth 3 (three) times daily. 04/24/18   Rise MuLeaphart, Kenneth T, PA-C  cyclobenzaprine (FLEXERIL) 5 MG tablet Take 1 tablet (5 mg total) by mouth 3 (three) times daily as needed for muscle spasms. Patient not taking: Reported on 11/16/2016 03/24/16   Hyacinth MeekerAhmed, Tasrif, MD  ferrous sulfate 325 (65 FE) MG tablet Take 325 mg by mouth daily with breakfast.    [provider]  fluticasone (FLONASE) 50 MCG/ACT nasal spray instill 2 sprays into each nostril once daily 11/22/16   Valentino NoseBoswell, Nathan, MD  HYDROcodone-acetaminophen (NORCO/VICODIN) 5-325 MG tablet Take 1 tablet by mouth every 6 (six) hours as needed for up to 3 days for severe pain. 10/31/18 11/03/18  Abbegale Stehle A, PA-C  ibuprofen (ADVIL,MOTRIN) 200 MG tablet  Take 3 tablets (600 mg total) by mouth every 8 (eight) hours as needed. 01/14/16   Fayrene Helper, PA-C  methocarbamol (ROBAXIN) 500 MG tablet Take 1 tablet (500 mg total) by mouth at bedtime as needed for muscle spasms. 09/20/18   Caccavale, Sophia, PA-C  Multiple Vitamin (MULTIVITAMIN WITH MINERALS) TABS Take 1 tablet by mouth daily. Reported on 11/25/2015    [provider]  naproxen (NAPROSYN) 500 MG tablet Take 1 tablet (500 mg total) by mouth 2 (two) times daily for 10 days. 10/31/18 11/10/18  Brecken Walth A, PA-C  polyethylene glycol (MIRALAX / GLYCOLAX) packet Take 17 g by mouth daily. 07/13/15   Emokpae, Ejiroghene E, MD  PROAIR HFA 108 (90 Base) MCG/ACT inhaler INHALE 1 TO 2 PUFFS INTO LUNGS EVERY 6 HOURS AS NEEDED FOR WHEEZING OR  SHORTNESS OF BREATH 11/22/16   Valentino Nose, MD  sertraline (ZOLOFT) 50 MG tablet Take 1 tablet (50 mg total) by mouth daily. 11/16/16 11/16/17  Deneise Lever, MD  varenicline (CHANTIX STARTING MONTH PAK) 0.5 MG X 11 & 1 MG X 42 tablet Take one 0.5 mg tablet by mouth once daily for 3 days, then one 0.5 mg tablet twice daily for 4 days, then one 1 mg tablet twice daily. Patient not taking: Reported on 11/16/2016 01/01/16   Ruben Im, MD    Family History Family History  Problem Relation Age of Onset  . Diabetes Mother   . Diabetes Father     Social History Social History   Tobacco Use  . Smoking status: Current Every Day Smoker    Packs/day: 0.50    Types: Cigarettes  . Smokeless tobacco: Never Used  . Tobacco comment: trying to cut back - on Chantrix.  Substance Use Topics  . Alcohol use: Yes    Alcohol/week: 0.0 standard drinks    Comment: Occasionally.  . Drug use: No     Allergies   Patient has no known allergies.   Review of Systems Review of Systems  Constitutional: Negative.   HENT: Positive for dental problem. Negative for congestion, drooling, ear discharge, ear pain, facial swelling, postnasal drip, rhinorrhea, sinus pressure, sinus pain, sore throat, tinnitus, trouble swallowing and voice change.   Respiratory: Negative.   Cardiovascular: Negative.   Gastrointestinal: Negative.   Genitourinary: Negative.   Musculoskeletal: Negative.   Skin: Negative.   Neurological: Negative.   All other systems reviewed and are negative.    Physical Exam Updated Vital Signs BP 112/69 (BP Location: Right Arm)   Pulse 90   Temp 97.8 F (36.6 C) (Oral)   Resp 16   Ht 5\' 4"  (1.626 m)   LMP  (Within Weeks)   SpO2 100%   BMI 28.39 kg/m   Physical Exam Vitals signs and nursing note reviewed.  Constitutional:      General: She is not in acute distress.    Appearance: She is well-developed. She is not ill-appearing, toxic-appearing or diaphoretic.  HENT:      Head: Normocephalic and atraumatic.     Right Ear: Tympanic membrane, ear canal and external ear normal. There is no impacted cerumen.     Left Ear: Tympanic membrane, ear canal and external ear normal. There is no impacted cerumen.     Nose: Nose normal.     Mouth/Throat:     Lips: Pink.     Mouth: Mucous membranes are moist.     Pharynx: Oropharynx is clear. Uvula midline. No pharyngeal swelling, oropharyngeal exudate, posterior oropharyngeal erythema or  uvula swelling.     Tonsils: No tonsillar exudate or tonsillar abscesses. Swelling: 0 on the right. 0 on the left.     Comments: Tenderness over teeth #3, 4, 29 and 30.  Mild gingival erythema without edema surrounding tooth #3/4.Marland Kitchen  No evidence of periapical abscess.  No oral Lesions.  Mucous membranes moist.  Patient with multiple dental caries as well as multiple missing teeth.  Eyes:     Pupils: Pupils are equal, round, and reactive to light.  Neck:     Musculoskeletal: Full passive range of motion without pain and normal range of motion.     Trachea: Trachea and phonation normal.     Comments: No submandibular swelling or erythema.  Phonation normal.  No trismus, no dysphasia. Cardiovascular:     Rate and Rhythm: Normal rate.     Pulses: Normal pulses.     Heart sounds: Normal heart sounds.  Pulmonary:     Effort: Pulmonary effort is normal. No respiratory distress.     Breath sounds: Normal breath sounds and air entry.  Abdominal:     General: There is no distension.     Palpations: Abdomen is soft.     Tenderness: There is no abdominal tenderness. There is no guarding or rebound.  Musculoskeletal: Normal range of motion.  Skin:    General: Skin is warm and dry.     Comments: No edema, erythema, warmth, rashes or lesions to face or neck.  Neurological:     Mental Status: She is alert.      ED Treatments / Results  Labs (all labs ordered are listed, but only abnormal results are displayed) Labs Reviewed - No data to  display  EKG None  Radiology No results found.  Procedures Procedures (including critical care time)  Medications Ordered in ED Medications - No data to display   Initial Impression / Assessment and Plan / ED Course  I have reviewed the triage vital signs and the nursing notes.  Pertinent labs & imaging results that were available during my care of the patient were reviewed by me and considered in my medical decision making (see chart for details).  43 year old female who appears otherwise well presents for evaluation of dental pain.  Onset 2 days ago.  Afebrile, nonseptic, non-ill-appearing.  Patient with overall poor dentition with multiple missing teeth and multiple dental caries.  Patient states she cannot afford to follow-up with dentistry.  Has been taking Tylenol and ibuprofen without relief of her symptoms.  Patient has no evidence of drainable periapical abscess.  She does have mild gingival erythema surrounding tooth number 3/4.  No evidence of Ludwig angina or deep space infection. Will treat with ABx and anti-inflammatories medicine.  Able to tolerate p.o. intake in department that difficulty.  Urged patient to follow-up with dentist.  Patient is hemodynamically stable and appropriate for DC home at this time.  I discussed return precautions.  Patient voiced understanding and is agreeable for follow-up.    Final Clinical Impressions(s) / ED Diagnoses   Final diagnoses:  Pain, dental    ED Discharge Orders         Ordered    amoxicillin (AMOXIL) 500 MG capsule  2 times daily     10/31/18 1232    naproxen (NAPROSYN) 500 MG tablet  2 times daily     10/31/18 1232    HYDROcodone-acetaminophen (NORCO/VICODIN) 5-325 MG tablet  Every 6 hours PRN     10/31/18 1232  Linwood DibblesHenderly, Azelyn Batie A, PA-C 10/31/18 1239    Wynetta FinesMessick, Peter C, MD 10/31/18 1858

## 2018-10-31 NOTE — ED Notes (Signed)
Signature pad not working. Pt received prescriptions for medications.

## 2018-10-31 NOTE — ED Notes (Signed)
Pt verbalized understanding of discharge instructions and denies any further questions at this time.   

## 2018-10-31 NOTE — ED Triage Notes (Signed)
Pt endorses right sided upper and lower dental pain x2 days. Pt endorses nausea x2 days. Pt has attempted oragel and ibuprofen at home with no relief.

## 2020-04-03 DIAGNOSIS — Z23 Encounter for immunization: Secondary | ICD-10-CM | POA: Diagnosis not present

## 2020-07-23 ENCOUNTER — Emergency Department (HOSPITAL_COMMUNITY)
Admission: EM | Admit: 2020-07-23 | Discharge: 2020-07-23 | Disposition: A | Discharge status: Home / Self Care | Payer: Medicaid Other | Attending: Emergency Medicine | Admitting: Emergency Medicine

## 2020-07-23 ENCOUNTER — Encounter (HOSPITAL_COMMUNITY): Payer: Self-pay | Admitting: Emergency Medicine

## 2020-07-23 ENCOUNTER — Emergency Department (HOSPITAL_COMMUNITY): Payer: Medicaid Other

## 2020-07-23 ENCOUNTER — Other Ambulatory Visit: Payer: Self-pay

## 2020-07-23 DIAGNOSIS — J449 Chronic obstructive pulmonary disease, unspecified: Secondary | ICD-10-CM | POA: Diagnosis not present

## 2020-07-23 DIAGNOSIS — Z20822 Contact with and (suspected) exposure to covid-19: Secondary | ICD-10-CM | POA: Insufficient documentation

## 2020-07-23 DIAGNOSIS — F17210 Nicotine dependence, cigarettes, uncomplicated: Secondary | ICD-10-CM | POA: Insufficient documentation

## 2020-07-23 DIAGNOSIS — R059 Cough, unspecified: Secondary | ICD-10-CM | POA: Diagnosis not present

## 2020-07-23 DIAGNOSIS — Z7952 Long term (current) use of systemic steroids: Secondary | ICD-10-CM | POA: Insufficient documentation

## 2020-07-23 DIAGNOSIS — J189 Pneumonia, unspecified organism: Secondary | ICD-10-CM | POA: Diagnosis not present

## 2020-07-23 DIAGNOSIS — R0602 Shortness of breath: Secondary | ICD-10-CM

## 2020-07-23 DIAGNOSIS — R600 Localized edema: Secondary | ICD-10-CM | POA: Insufficient documentation

## 2020-07-23 DIAGNOSIS — J45909 Unspecified asthma, uncomplicated: Secondary | ICD-10-CM | POA: Diagnosis not present

## 2020-07-23 DIAGNOSIS — R21 Rash and other nonspecific skin eruption: Secondary | ICD-10-CM | POA: Diagnosis not present

## 2020-07-23 LAB — CBC
HCT: 41.2 % (ref 36.0–46.0)
Hemoglobin: 12.9 g/dL (ref 12.0–15.0)
MCH: 28.9 pg (ref 26.0–34.0)
MCHC: 31.3 g/dL (ref 30.0–36.0)
MCV: 92.2 fL (ref 80.0–100.0)
Platelets: 339 10*3/uL (ref 150–400)
RBC: 4.47 MIL/uL (ref 3.87–5.11)
RDW: 14.7 % (ref 11.5–15.5)
WBC: 9.6 10*3/uL (ref 4.0–10.5)
nRBC: 0 % (ref 0.0–0.2)

## 2020-07-23 LAB — BASIC METABOLIC PANEL
Anion gap: 10 (ref 5–15)
BUN: 7 mg/dL (ref 6–20)
CO2: 26 mmol/L (ref 22–32)
Calcium: 9.6 mg/dL (ref 8.9–10.3)
Chloride: 104 mmol/L (ref 98–111)
Creatinine, Ser: 0.56 mg/dL (ref 0.44–1.00)
GFR calc non Af Amer: >60 mL/min (ref >60)
Glucose, Bld: 137 mg/dL — ABNORMAL HIGH (ref 70–99)
Potassium: 4 mmol/L (ref 3.5–5.1)
Sodium: 140 mmol/L (ref 135–145)

## 2020-07-23 LAB — RESPIRATORY PANEL BY RT PCR (FLU A&B, COVID)
Influenza A by PCR: NEGATIVE
Influenza B by PCR: NEGATIVE
SARS Coronavirus 2 by RT PCR: NEGATIVE

## 2020-07-23 LAB — I-STAT BETA HCG BLOOD, ED (MC, WL, AP ONLY): I-stat hCG, quantitative: <5 m[IU]/mL (ref <5)

## 2020-07-23 LAB — TROPONIN I (HIGH SENSITIVITY): Troponin I (High Sensitivity): 3 ng/L (ref <18)

## 2020-07-23 LAB — BRAIN NATRIURETIC PEPTIDE: B Natriuretic Peptide: 66.8 pg/mL (ref 0.0–100.0)

## 2020-07-23 MED ORDER — ALBUTEROL SULFATE HFA 108 (90 BASE) MCG/ACT IN AERS
2.0000 | INHALATION_SPRAY | Freq: Once | RESPIRATORY_TRACT | Status: AC
  Administered 2020-07-23: 2 via RESPIRATORY_TRACT
  Filled 2020-07-23: qty 6.7

## 2020-07-23 MED ORDER — METHYLPREDNISOLONE SODIUM SUCC 125 MG IJ SOLR
80.0000 mg | Freq: Once | INTRAMUSCULAR | Status: AC
  Administered 2020-07-23: 80 mg via INTRAVENOUS
  Filled 2020-07-23: qty 2

## 2020-07-23 MED ORDER — PREDNISONE 10 MG (21) PO TBPK: ORAL_TABLET | ORAL | 0 refills | Status: DC

## 2020-07-23 MED ORDER — IPRATROPIUM BROMIDE HFA 17 MCG/ACT IN AERS
2.0000 | INHALATION_SPRAY | Freq: Once | RESPIRATORY_TRACT | Status: AC
  Administered 2020-07-23: 2 via RESPIRATORY_TRACT
  Filled 2020-07-23: qty 12.9

## 2020-07-23 NOTE — ED Triage Notes (Signed)
Patient arrives to ED with complaints of increasing shortness of breath over the past week. Pt states that she has developed a wet cough, and feels like she cannot cough up all the phlegm. Pt states that her feet/ankles have recently started to swell. Denies CP, fever, and chills.

## 2020-07-23 NOTE — ED Provider Notes (Signed)
[facility] EMERGENCY DEPARTMENT Provider Note   CSN: [id number] Arrival date & time: 07/23/20  1740     History Chief Complaint  Patient presents with  . Shortness of Breath  . Cough    [name] is a 44 y.o. female.  HPI      [name] is a 44 y.o. female, with a history of asthma and COPD, presenting to the ED with cough for the past week.  Intermittent shortness of breath. She does report some lower extremity edema bilaterally that she thinks is new. She has orthopnea type symptoms at baseline.  Denies fever/chills, chest pain, dizziness, syncope, abdominal pain, N/V/D, lower extremity pain, or any other complaints.   Past Medical History:  Diagnosis Date  . Asthma   . Carpal tunnel syndrome     Patient Active Problem List   Diagnosis Date Noted  . Abdominal pain 11/16/2016  . Sjogren's syndrome (HCC) 11/16/2016  . Severe depression (HCC) 11/16/2016  . Chronic arm pain 01/01/2016  . Abnormal menstrual periods 07/13/2015  . Iron deficiency anemia 03/18/2015  . Ovarian cyst 02/20/2015  . LLQ abdominal tenderness 01/27/2015  . Seasonal allergies 01/27/2015  . Tobacco abuse 01/12/2015  . COPD (chronic obstructive pulmonary disease) (HCC) 01/12/2015  . Preventative health care 01/12/2015  . Neck pain on left side 01/12/2015    Past Surgical History:  Procedure Laterality Date  . CESAREAN SECTION    . Tubal ligation       OB History    Gravida  4   Para  2   Term  1   Preterm  1   AB  2   Living        SAB  2   TAB      Ectopic      Multiple      Live Births              Family History  Problem Relation Age of Onset  . Diabetes Mother   . Diabetes Father     Social History   Tobacco Use  . Smoking status: Current Every Day Smoker    Packs/day: 0.50    Types: Cigarettes  . Smokeless tobacco: Never Used  . Tobacco comment: trying to cut back - on Chantrix.  Substance Use Topics  . Alcohol use:  Yes    Alcohol/week: 0.0 standard drinks    Comment: Occasionally.  . Drug use: No    Home Medications Prior to Admission medications   Medication Sig Start Date End Date Taking? Authorizing Provider  acetaminophen (TYLENOL) 500 MG tablet Take 500 mg by mouth as needed. Reported on 11/25/2015    [provider]  clindamycin (CLEOCIN) 300 MG capsule Take 1 capsule (300 mg total) by mouth 3 (three) times daily. 04/24/18   Rise Mu, PA-C  cyclobenzaprine (FLEXERIL) 5 MG tablet Take 1 tablet (5 mg total) by mouth 3 (three) times daily as needed for muscle spasms. Patient not taking: Reported on 11/16/2016 03/24/16   Hyacinth Meeker, MD  ferrous sulfate 325 (65 FE) MG tablet Take 325 mg by mouth daily with breakfast.    [provider]  fluticasone (FLONASE) 50 MCG/ACT nasal spray instill 2 sprays into each nostril once daily 11/22/16   Valentino Nose, MD  ibuprofen (ADVIL,MOTRIN) 200 MG tablet Take 3 tablets (600 mg total) by mouth every 8 (eight) hours as needed. 01/14/16   Fayrene Helper, PA-C  methocarbamol (ROBAXIN) 500 MG tablet  Take 1 tablet (500 mg total) by mouth at bedtime as needed for muscle spasms. 09/20/18   Caccavale, Sophia, PA-C  Multiple Vitamin (MULTIVITAMIN WITH MINERALS) TABS Take 1 tablet by mouth daily. Reported on 11/25/2015    [provider]  polyethylene glycol (MIRALAX / GLYCOLAX) packet Take 17 g by mouth daily. 07/13/15   Emokpae, Ejiroghene E, MD  predniSONE (STERAPRED UNI-PAK 21 TAB) 10 MG (21) TBPK tablet Take 6 tabs (60mg ) day 1, 5 tabs (50mg ) day 2, 4 tabs (40mg ) day 3, 3 tabs (30mg ) day 4, 2 tabs (20mg ) day 5, and 1 tab (10mg ) day 6. 07/23/20   Niyla Marone C, PA-C  PROAIR HFA 108 (90 Base) MCG/ACT inhaler INHALE 1 TO 2 PUFFS INTO LUNGS EVERY 6 HOURS AS NEEDED FOR WHEEZING OR SHORTNESS OF BREATH 11/22/16   Valentino NoseBoswell, Nathan, MD  sertraline (ZOLOFT) 50 MG tablet Take 1 tablet (50 mg total) by mouth daily. 11/16/16 11/16/17  Deneise LeverSaraiya, Parth, MD    varenicline (CHANTIX STARTING MONTH PAK) 0.5 MG X 11 & 1 MG X 42 tablet Take one 0.5 mg tablet by mouth once daily for 3 days, then one 0.5 mg tablet twice daily for 4 days, then one 1 mg tablet twice daily. Patient not taking: Reported on 11/16/2016 01/01/16   Ruben ImFord, Jeremy, MD    Allergies    Patient has no known allergies.  Review of Systems   Review of Systems  Constitutional: Negative for chills, diaphoresis and fever.  Respiratory: Positive for cough, shortness of breath and wheezing.   Cardiovascular: Positive for leg swelling. Negative for chest pain.  Gastrointestinal: Negative for abdominal pain, diarrhea, nausea and vomiting.  Genitourinary: Negative for dysuria.  Neurological: Negative for dizziness, syncope and weakness.  All other systems reviewed and are negative.   Physical Exam Updated Vital Signs BP (!) 144/68 (BP Location: Right Arm)   Pulse (!) 104   Temp 98.1 F (36.7 C) (Oral)   Resp 16   Ht 5\' 4"  (1.626 m)   SpO2 95%   BMI 28.39 kg/m   Physical Exam Vitals and nursing note reviewed.  Constitutional:      General: She is not in acute distress.    Appearance: She is well-developed. She is not diaphoretic.  HENT:     Head: Normocephalic and atraumatic.     Mouth/Throat:     Mouth: Mucous membranes are moist.     Pharynx: Oropharynx is clear.  Eyes:     Conjunctiva/sclera: Conjunctivae normal.  Cardiovascular:     Rate and Rhythm: Normal rate and regular rhythm.     Pulses: Normal pulses.          Radial pulses are 2+ on the right side and 2+ on the left side.       Posterior tibial pulses are 2+ on the right side and 2+ on the left side.     Heart sounds: Normal heart sounds.     Comments: Tactile temperature in the extremities appropriate and equal bilaterally.  No pain, erythema, or tenderness to the lower extremities. Pulmonary:     Effort: Pulmonary effort is normal. No respiratory distress.     Breath sounds: Examination of the right-lower  field reveals decreased breath sounds. Examination of the left-lower field reveals decreased breath sounds. Decreased breath sounds and wheezing present.     Comments: Global, expiratory wheezing.  Diminished lower lung sounds bilaterally.  Speaks in full sentences without noted difficulty. Abdominal:     Palpations: Abdomen is  soft.     Tenderness: There is no abdominal tenderness. There is no guarding.  Musculoskeletal:     Cervical back: Neck supple.     Right lower leg: 1+ Pitting Edema present.     Left lower leg: 1+ Pitting Edema present.  Lymphadenopathy:     Cervical: No cervical adenopathy.  Skin:    General: Skin is warm and dry.  Neurological:     Mental Status: She is alert.  Psychiatric:        Mood and Affect: Mood and affect normal.        Speech: Speech normal.        Behavior: Behavior normal.     ED Results / Procedures / Treatments   Labs (all labs ordered are listed, but only abnormal results are displayed) Labs Reviewed  BASIC METABOLIC PANEL - Abnormal; Notable for the following components:      Result Value   Glucose, Bld 137 (*)    All other components within normal limits  RESPIRATORY PANEL BY RT PCR (FLU A&B, COVID)  CBC  BRAIN NATRIURETIC PEPTIDE  I-STAT BETA HCG BLOOD, ED ([facility], [facility], [facility] ONLY)  TROPONIN I (HIGH SENSITIVITY)    EKG EKG Interpretation  Date/Time:  Thursday July 23 2020 17:43:23 EDT Ventricular Rate:  100 PR Interval:  136 QRS Duration: 80 QT Interval:  338 QTC Calculation: 436 R Axis:   85 Text Interpretation: Normal sinus rhythm Normal ECG since last tracing no significant change Confirmed by Rolan Bucco [id number]) on 07/23/2020 7:01:30 PM   Radiology DG Chest 2 View  Result Date: 07/23/2020 CLINICAL DATA:  44 year old female with shortness of breath. EXAM: CHEST - 2 VIEW COMPARISON:  Chest radiograph dated 10/13/2004. FINDINGS: Mild diffuse interstitial prominence may represent chronic changes, edema, or less likely  atypical pneumonia. Clinical correlation is recommended. No focal consolidation, pleural effusion, or pneumothorax. The cardiac silhouette is within limits. No acute osseous pathology. IMPRESSION: No focal consolidation. Electronically Signed   By: Elgie Collard M.D.   On: 07/23/2020 18:30    Procedures Procedures (including critical care time)  Medications Ordered in ED Medications  albuterol (VENTOLIN HFA) 108 (90 Base) MCG/ACT inhaler 2 puff (2 puffs Inhalation Given 07/23/20 1937)  ipratropium (ATROVENT HFA) inhaler 2 puff (2 puffs Inhalation Given 07/23/20 1937)  methylPREDNISolone sodium succinate (SOLU-MEDROL) 125 mg/2 mL injection 80 mg (80 mg Intravenous Given 07/23/20 1936)    ED Course  I have reviewed the triage vital signs and the nursing notes.  Pertinent labs & imaging results that were available during my care of the patient were reviewed by me and considered in my medical decision making (see chart for details).    MDM Rules/Calculators/A&P                          Patient presents with cough and shortness of breath.  She does have wheezing on exam.  After albuterol and Atrovent inhaler administration here in the ED, patient states her breathing improved.  She still has wheezing on exam, but she is much less diminished. She does have some mild lower extremity edema bilaterally, however, it is pitting, which may mean that it is not as acute as the patient originally thought. No evidence of pulmonary edema or other acute process on chest x-ray.  BNP negative. Troponin negative.  No acute abnormalities on EKG to indicate acute ischemia. Covid negative. Patient ambulated without onset of shortness of breath, difficulty breathing, and  while maintaining adequate SPO2 on room air.  Follow-up with PCP recommended.  She will need further assessment of her lower extremity edema.  The patient was given instructions for home care as well as return precautions. Patient voices  understanding of these instructions, accepts the plan, and is comfortable with discharge.  I reviewed and interpreted the patient's labs and radiological studies.    Findings and plan of care discussed with Rolan Bucco, MD.    Vitals:   07/23/20 2000 07/23/20 2100 07/23/20 2200 07/23/20 2215  BP: 139/73 (!) 144/72 (!) 149/81 (!) 150/78  Pulse: 91 (!) 101 95 96  Resp: (!) 24 (!) 23 17 19   Temp:      TempSrc:      SpO2: 96% 97% 96% 97%  Height:         Final Clinical Impression(s) / ED Diagnoses Final diagnoses:  Shortness of breath  Cough    Rx / DC Orders ED Discharge Orders         Ordered    predniSONE (STERAPRED UNI-PAK 21 TAB) 10 MG (21) TBPK tablet        07/23/20 2225           2226, PA-C 07/24/20 1724    09/23/20, MD 07/24/20 1747

## 2020-07-23 NOTE — ED Notes (Signed)
Pt on road trip on room air with Pulse Ox; O2 sat 95 while walking and talking; 96 while walking quietly.  Provider advised.

## 2020-07-23 NOTE — Discharge Instructions (Addendum)
  Prednisone: Take the prednisone, as prescribed, until finished. If you are a diabetic, please know prednisone can raise your blood sugar temporarily.  Follow-up: Follow-up with your primary care provider on this matter.  Return: Return to the emergency department for worsening shortness of breath, chest pain, abdominal pain, passing out, or any other major concerns.

## 2020-08-06 ENCOUNTER — Other Ambulatory Visit: Payer: Self-pay

## 2020-08-06 ENCOUNTER — Emergency Department (HOSPITAL_COMMUNITY): Payer: Medicaid Other

## 2020-08-06 ENCOUNTER — Encounter (HOSPITAL_COMMUNITY): Payer: Self-pay

## 2020-08-06 DIAGNOSIS — J449 Chronic obstructive pulmonary disease, unspecified: Secondary | ICD-10-CM | POA: Diagnosis not present

## 2020-08-06 DIAGNOSIS — J029 Acute pharyngitis, unspecified: Secondary | ICD-10-CM | POA: Diagnosis not present

## 2020-08-06 DIAGNOSIS — J45909 Unspecified asthma, uncomplicated: Secondary | ICD-10-CM | POA: Insufficient documentation

## 2020-08-06 DIAGNOSIS — R059 Cough, unspecified: Secondary | ICD-10-CM | POA: Diagnosis not present

## 2020-08-06 DIAGNOSIS — F17210 Nicotine dependence, cigarettes, uncomplicated: Secondary | ICD-10-CM | POA: Diagnosis not present

## 2020-08-06 DIAGNOSIS — R070 Pain in throat: Secondary | ICD-10-CM | POA: Diagnosis present

## 2020-08-06 DIAGNOSIS — Z79899 Other long term (current) drug therapy: Secondary | ICD-10-CM | POA: Insufficient documentation

## 2020-08-06 LAB — GROUP A STREP BY PCR: Group A Strep by PCR: NOT DETECTED

## 2020-08-06 NOTE — ED Triage Notes (Addendum)
Patient arrived stating that she has had a sore/red throat and a productive cough over the last few days. States it hurts to swallow. States she has been taking tylenol and ibuprofen for pain. Vaccinated against covid-19.

## 2020-08-07 ENCOUNTER — Emergency Department (HOSPITAL_COMMUNITY)
Admission: EM | Admit: 2020-08-07 | Discharge: 2020-08-07 | Disposition: A | Discharge status: Home / Self Care | Payer: Medicaid Other | Attending: Emergency Medicine | Admitting: Emergency Medicine

## 2020-08-07 DIAGNOSIS — J029 Acute pharyngitis, unspecified: Secondary | ICD-10-CM

## 2020-08-07 DIAGNOSIS — R059 Cough, unspecified: Secondary | ICD-10-CM

## 2020-08-07 MED ORDER — OMEPRAZOLE 20 MG PO CPDR: 20.0000 mg | DELAYED_RELEASE_CAPSULE | Freq: Every day | ORAL | 1 refills | Status: DC

## 2020-08-07 MED ORDER — DEXAMETHASONE SODIUM PHOSPHATE 10 MG/ML IJ SOLN
10.0000 mg | Freq: Once | INTRAMUSCULAR | Status: AC
  Administered 2020-08-07: 10 mg via INTRAMUSCULAR
  Filled 2020-08-07: qty 1

## 2020-08-07 MED ORDER — LIDOCAINE VISCOUS HCL 2 % MT SOLN
15.0000 mL | Freq: Once | OROMUCOSAL | Status: AC
  Administered 2020-08-07: 15 mL via ORAL
  Filled 2020-08-07: qty 15

## 2020-08-07 MED ORDER — ALUM & MAG HYDROXIDE-SIMETH 200-200-20 MG/5ML PO SUSP
30.0000 mL | Freq: Once | ORAL | Status: AC
  Administered 2020-08-07: 30 mL via ORAL
  Filled 2020-08-07: qty 30

## 2020-08-07 NOTE — ED Provider Notes (Signed)
[facility]-EMERGENCY DEPT Provider Note   CSN: [id number] Arrival date & time: 08/06/20  2227     History Chief Complaint  Patient presents with  . Sore Throat    [name] is a 44 y.o. female.   44 y/o female with hx of asthma presents to the ED for sore throat and cough. Symptoms present over the past few days. Notes associated sour taste in her throat at times. She has been using her inhalers for cough management with minimal improvement. [place]s cough drops will temporarily soothe her throat. Further has some pain relief following tylenol/motrin. No fevers, inability to swallow, drooling, hemoptysis, vomiting, sick contacts. Quit smoking "120 hours ago"; previously was a 2 ppd smoker.  Vaccinated against COVID-19.   Sore Throat       Past Medical History:  Diagnosis Date  . Asthma   . Carpal tunnel syndrome     Patient Active Problem List   Diagnosis Date Noted  . Abdominal pain 11/16/2016  . Sjogren's syndrome (HCC) 11/16/2016  . Severe depression (HCC) 11/16/2016  . Chronic arm pain 01/01/2016  . Abnormal menstrual periods 07/13/2015  . Iron deficiency anemia 03/18/2015  . Ovarian cyst 02/20/2015  . LLQ abdominal tenderness 01/27/2015  . Seasonal allergies 01/27/2015  . Tobacco abuse 01/12/2015  . COPD (chronic obstructive pulmonary disease) (HCC) 01/12/2015  . Preventative health care 01/12/2015  . Neck pain on left side 01/12/2015    Past Surgical History:  Procedure Laterality Date  . CESAREAN SECTION    . Tubal ligation       OB History    Gravida  4   Para  2   Term  1   Preterm  1   AB  2   Living        SAB  2   TAB      Ectopic      Multiple      Live Births              Family History  Problem Relation Age of Onset  . Diabetes Mother   . Diabetes Father     Social History   Tobacco Use  . Smoking status: Current Every Day Smoker    Packs/day: 0.50    Types: Cigarettes  .  Smokeless tobacco: Never Used  . Tobacco comment: trying to cut back - on Chantrix.  Substance Use Topics  . Alcohol use: Yes    Alcohol/week: 0.0 standard drinks    Comment: Occasionally.  . Drug use: No    Home Medications Prior to Admission medications   Medication Sig Start Date End Date Taking? Authorizing Provider  acetaminophen (TYLENOL) 500 MG tablet Take 500 mg by mouth as needed. Reported on 11/25/2015    [provider]  clindamycin (CLEOCIN) 300 MG capsule Take 1 capsule (300 mg total) by mouth 3 (three) times daily. 04/24/18   Rise Mu, PA-C  cyclobenzaprine (FLEXERIL) 5 MG tablet Take 1 tablet (5 mg total) by mouth 3 (three) times daily as needed for muscle spasms. Patient not taking: Reported on 11/16/2016 03/24/16   Hyacinth Meeker, MD  ferrous sulfate 325 (65 FE) MG tablet Take 325 mg by mouth daily with breakfast.    [provider]  fluticasone (FLONASE) 50 MCG/ACT nasal spray instill 2 sprays into each nostril once daily 11/22/16   Valentino Nose, MD  ibuprofen (ADVIL,MOTRIN) 200 MG tablet Take 3 tablets (600 mg total) by mouth every 8 (  eight) hours as needed. 01/14/16   Fayrene Helper, PA-C  methocarbamol (ROBAXIN) 500 MG tablet Take 1 tablet (500 mg total) by mouth at bedtime as needed for muscle spasms. 09/20/18   Caccavale, Sophia, PA-C  Multiple Vitamin (MULTIVITAMIN WITH MINERALS) TABS Take 1 tablet by mouth daily. Reported on 11/25/2015    [provider]  omeprazole (PRILOSEC) 20 MG capsule Take 1 capsule (20 mg total) by mouth daily. 08/07/20   Antony Madura, PA-C  polyethylene glycol (MIRALAX / GLYCOLAX) packet Take 17 g by mouth daily. 07/13/15   Emokpae, Ejiroghene E, MD  predniSONE (STERAPRED UNI-PAK 21 TAB) 10 MG (21) TBPK tablet Take 6 tabs (60mg ) day 1, 5 tabs (50mg ) day 2, 4 tabs (40mg ) day 3, 3 tabs (30mg ) day 4, 2 tabs (20mg ) day 5, and 1 tab (10mg ) day 6. 07/23/20   Joy, Shawn C, PA-C  PROAIR HFA 108 (90 Base) MCG/ACT inhaler INHALE 1  TO 2 PUFFS INTO LUNGS EVERY 6 HOURS AS NEEDED FOR WHEEZING OR SHORTNESS OF BREATH 11/22/16   , MD  sertraline (ZOLOFT) 50 MG tablet Take 1 tablet (50 mg total) by mouth daily. 11/16/16 11/16/17  , MD  varenicline (CHANTIX STARTING MONTH PAK) 0.5 MG X 11 & 1 MG X 42 tablet Take one 0.5 mg tablet by mouth once daily for 3 days, then one 0.5 mg tablet twice daily for 4 days, then one 1 mg tablet twice daily. Patient not taking: Reported on 11/16/2016 01/01/16   Valentino Nose, MD    Allergies    Patient has no known allergies.  Review of Systems   Review of Systems  Ten systems reviewed and are negative for acute change, except as noted in the HPI.    Physical Exam Updated Vital Signs BP 134/89 (BP Location: Left Arm)   Pulse 86   Temp 98 F (36.7 C) (Oral)   Resp 14   LMP 07/09/2020   SpO2 100%   Physical Exam Vitals and nursing note reviewed.  Constitutional:      General: She is not in acute distress.    Appearance: She is well-developed. She is not diaphoretic.     Comments: Nontoxic-appearing and in no distress  HENT:     Head: Normocephalic and atraumatic.     Mouth/Throat:     Comments: Mild posterior oropharyngeal erythema without edema.  No exudates.  Uvula midline.  Normal phonation. Eyes:     General: No scleral icterus.    Conjunctiva/sclera: Conjunctivae normal.  Neck:     Comments: No meningismus Cardiovascular:     Rate and Rhythm: Normal rate and regular rhythm.     Pulses: Normal pulses.  Pulmonary:     Effort: Pulmonary effort is normal. No respiratory distress.     Comments: Faint expiratory wheeze in the left upper lobe posteriorly.  Lungs otherwise clear.  Respirations even and unlabored. Musculoskeletal:        General: Normal range of motion.     Cervical back: Normal range of motion.  Skin:    General: Skin is warm and dry.     Coloration: Skin is not pale.     Findings: No erythema or rash.  Neurological:     Mental  Status: She is alert and oriented to person, place, and time.  Psychiatric:        Behavior: Behavior normal.     ED Results / Procedures / Treatments   Labs (all labs ordered are listed, but only abnormal  results are displayed) Labs Reviewed  GROUP A STREP BY PCR    EKG None  Radiology DG Chest 2 View  Result Date: 08/06/2020 CLINICAL DATA:  Productive cough EXAM: CHEST - 2 VIEW COMPARISON:  07/23/2020 FINDINGS: Peribronchial thickening and interstitial prominence. Heart is normal size. No confluent opacities or effusions. No acute bony abnormality. IMPRESSION: Mild bronchitic changes. Electronically Signed   By: Charlett Nose M.D.   On: 08/06/2020 22:56    Procedures Procedures (including critical care time)  Medications Ordered in ED Medications  alum & mag hydroxide-simeth (MAALOX/MYLANTA) 200-200-20 MG/5ML suspension 30 mL (has no administration in time range)    And  lidocaine (XYLOCAINE) 2 % viscous mouth solution 15 mL (has no administration in time range)  dexamethasone (DECADRON) injection 10 mg (has no administration in time range)    ED Course  I have reviewed the triage vital signs and the nursing notes.  Pertinent labs & imaging results that were available during my care of the patient were reviewed by me and considered in my medical decision making (see chart for details).    MDM Rules/Calculators/A&P                          44 year old female presenting for evaluation of cough and sore throat.  After talking with the patient further, she does report a sour taste in her throat at times as well.  Believe that her symptoms are secondary to untreated esophageal reflux.  Will start on omeprazole.  She did receive a strep screen in triage which was negative.  Chest x-ray completed prior to my evaluation of the patient.  This shows mild bronchitic changes.  Favored to be chronic related to smoking history.  Given Decadron IM prior to discharge.  Encouraged PCP  follow up.  Return precautions discussed and provided.  Patient discharged in stable condition with no unaddressed concerns.   Final Clinical Impression(s) / ED Diagnoses Final diagnoses:  Pharyngitis, unspecified etiology  Cough    Rx / DC Orders ED Discharge Orders         Ordered    omeprazole (PRILOSEC) 20 MG capsule  Daily        08/07/20 0531           Antony Madura, PA-C 08/07/20 0546    Palumbo, April, MD 08/07/20 3790

## 2020-08-07 NOTE — Discharge Instructions (Signed)
We suspect that your symptoms may be due to reflux.  Reflux can cause a persistent cough.  It is likely also responsible for the sour taste in your throat and sore throat.  We recommend that you start Prilosec for management of this.  Take this medication daily, as prescribed.  You were also given a shot of Decadron which is a steroid.  This may help with your cough and breathing given evidence of bronchitis on your chest x-ray.  Follow up with a primary care doctor for recheck.

## 2020-08-10 ENCOUNTER — Encounter: Payer: Self-pay | Admitting: Student

## 2020-08-10 ENCOUNTER — Ambulatory Visit: Payer: Medicaid Other | Admitting: Student

## 2020-08-10 ENCOUNTER — Other Ambulatory Visit: Payer: Self-pay

## 2020-08-10 VITALS — BP 119/64 | HR 90 | Temp 98.6°F | Wt 176.1 lb

## 2020-08-10 DIAGNOSIS — M3500 Sicca syndrome, unspecified: Secondary | ICD-10-CM

## 2020-08-10 DIAGNOSIS — Z23 Encounter for immunization: Secondary | ICD-10-CM | POA: Diagnosis not present

## 2020-08-10 DIAGNOSIS — M797 Fibromyalgia: Secondary | ICD-10-CM | POA: Insufficient documentation

## 2020-08-10 DIAGNOSIS — Z131 Encounter for screening for diabetes mellitus: Secondary | ICD-10-CM

## 2020-08-10 DIAGNOSIS — J449 Chronic obstructive pulmonary disease, unspecified: Secondary | ICD-10-CM | POA: Diagnosis not present

## 2020-08-10 DIAGNOSIS — J302 Other seasonal allergic rhinitis: Secondary | ICD-10-CM | POA: Diagnosis not present

## 2020-08-10 DIAGNOSIS — D509 Iron deficiency anemia, unspecified: Secondary | ICD-10-CM

## 2020-08-10 DIAGNOSIS — Z1231 Encounter for screening mammogram for malignant neoplasm of breast: Secondary | ICD-10-CM | POA: Diagnosis not present

## 2020-08-10 DIAGNOSIS — D069 Carcinoma in situ of cervix, unspecified: Secondary | ICD-10-CM

## 2020-08-10 DIAGNOSIS — Z13220 Encounter for screening for lipoid disorders: Secondary | ICD-10-CM | POA: Diagnosis not present

## 2020-08-10 DIAGNOSIS — K219 Gastro-esophageal reflux disease without esophagitis: Secondary | ICD-10-CM

## 2020-08-10 DIAGNOSIS — E538 Deficiency of other specified B group vitamins: Secondary | ICD-10-CM

## 2020-08-10 DIAGNOSIS — R12 Heartburn: Secondary | ICD-10-CM | POA: Diagnosis not present

## 2020-08-10 DIAGNOSIS — F322 Major depressive disorder, single episode, severe without psychotic features: Secondary | ICD-10-CM

## 2020-08-10 DIAGNOSIS — G8929 Other chronic pain: Secondary | ICD-10-CM

## 2020-08-10 DIAGNOSIS — D649 Anemia, unspecified: Secondary | ICD-10-CM | POA: Diagnosis not present

## 2020-08-10 DIAGNOSIS — M5410 Radiculopathy, site unspecified: Secondary | ICD-10-CM | POA: Diagnosis not present

## 2020-08-10 DIAGNOSIS — F17200 Nicotine dependence, unspecified, uncomplicated: Secondary | ICD-10-CM

## 2020-08-10 DIAGNOSIS — E569 Vitamin deficiency, unspecified: Secondary | ICD-10-CM

## 2020-08-10 LAB — POCT GLYCOSYLATED HEMOGLOBIN (HGB A1C): Hemoglobin A1C: 5.2 % (ref 4.0–5.6)

## 2020-08-10 LAB — GLUCOSE, CAPILLARY: Glucose-Capillary: 103 mg/dL — ABNORMAL HIGH (ref 70–99)

## 2020-08-10 MED ORDER — DULOXETINE HCL 20 MG PO CPEP: 20.0000 mg | ORAL_CAPSULE | Freq: Every day | ORAL | 1 refills | Status: DC

## 2020-08-10 MED ORDER — ALBUTEROL SULFATE HFA 108 (90 BASE) MCG/ACT IN AERS: INHALATION_SPRAY | RESPIRATORY_TRACT | 1 refills | Status: DC

## 2020-08-10 MED ORDER — ESOMEPRAZOLE MAGNESIUM 20 MG PO CPDR: 20.0000 mg | DELAYED_RELEASE_CAPSULE | Freq: Every day | ORAL | 1 refills | Status: DC

## 2020-08-10 MED ORDER — NICOTINE 21 MG/24HR TD PT24: 21.0000 mg | MEDICATED_PATCH | Freq: Every day | TRANSDERMAL | 0 refills | Status: DC

## 2020-08-10 MED ORDER — SPIRIVA RESPIMAT 2.5 MCG/ACT IN AERS: 2.0000 | INHALATION_SPRAY | Freq: Every day | RESPIRATORY_TRACT | Status: DC

## 2020-08-10 MED ORDER — IBUPROFEN 200 MG PO TABS: 400.0000 mg | ORAL_TABLET | Freq: Four times a day (QID) | ORAL | 0 refills | Status: DC | PRN

## 2020-08-10 MED ORDER — ALBUTEROL SULFATE HFA 108 (90 BASE) MCG/ACT IN AERS: 2.0000 | INHALATION_SPRAY | Freq: Once | RESPIRATORY_TRACT | Status: DC

## 2020-08-10 MED ORDER — NICOTINE 14 MG/24HR TD PT24: 14.0000 mg | MEDICATED_PATCH | TRANSDERMAL | 0 refills | Status: AC

## 2020-08-10 NOTE — Assessment & Plan Note (Addendum)
Patient continues to take iron supplements and a daily Women's multivitamin since being diagnosed with iron deficiency anemia in 2016. CBC this month is within normal limits.  - Will repeat iron studies - Will discontinue supplementation if iron levels normalized

## 2020-08-10 NOTE — Assessment & Plan Note (Signed)
Patient endorses chronic back pain and spasms with radiculopathy that extends down the lateral aspect of her bilateral lower extremities. She takes high doses of ibuprofen and tylenol for this at home (up to 20 pills total per day) and has been taking non-prescribed Vicodin. She denies taking anything else at this time.  - Advised patient to decrease ibuprofen to 400mg  q6 hours PRN - Decrease Tylenol to 1000mg  up to three times daily PRN - Will start Cymbalta 20mg  daily and uptitrate dose as needed if positive response

## 2020-08-10 NOTE — Assessment & Plan Note (Addendum)
Patient was diagnosed with COPD via PFT testing in 2016, with FEV1 79% that did not reverse s/p bronchodilator. She states she had been diagnosed with asthma as well although there is no clear evidence of this on chart review. She does have extensive smoking history, with 1PPD from ages 26 to 59 and 2 PPD since the age of 62. She stopped smoking 2 weeks ago and is currently on 21mg  nicotine patches. She endorses chronic cough over the past 3 months with clear sputum production and exertional SOB (also worse lying flat). She continued to use an albuterol inhaler that had expired but was prescribed a new albuterol inhaler and Atrovent inhaler on recent ED visit this month and she was prescribed 5-day course of steroids without ABX. She is unsure how to properly use her inhalers. On PE her lungs are CTA without wheezing and she is saturating at 100% on R/A. She did have trace lower extremity pitting edema but no JVD or crackles. Findings are consistent with COPD with recent exacerbation in the setting of smoking cessation. COVID negative 2 weeks ago. - Provided spacer  - Provided sample of Spiriva 2.77mcg 2 puffs once daily - Instructed patient to stop using Atrovent inhaler  - Continue albuterol inhaler 2 puffs q4-6 hours PRN  - Patient will likely need switched to Incruse Ellipta at future visit if positive response to Spiriva due to insurance coverage - Consider ECHO if LE swelling worsens or patient develops JVD on future visits - Prescribed 2 weeks 21mg  nicotine patch followed by 2 weeks 14g nicotine patch; will need continued titration down.

## 2020-08-10 NOTE — Patient Instructions (Addendum)
[name],  Today we discussed that your cough and shortness of breath are likely from your COPD. It is so great to hear that you have successfully stopped smoking - keep up the great work!  I have refilled your albuterol inhaler and have given you a sample of Spiriva.   Please STOP using your Atrovent inhaler. Please START using your Spiriva sample inhaler, 2 puffs once daily  Please CONTINUE taking your albuterol inhaler, 2 puffs every 4 to 6 hours as needed if you experience cough, shortness of breath, or wheezing.   The pain radiating down your legs is likely due to radiculopathy (nerve pain) originating from your back. Please only take ibuprofen 400mg  every 6 hours and Tylenol 1000mg  up to 3 times daily for your pain. Higher doses have not been shown to be effective and may cause damage to your stomach.   I have started you on Cymbalta today. Please take one pill per day, and we can increase your dose if needed. Please continue taking your Nexium one pill daily.   You can start taking Flonase 2 sprays per nostril once per day for nasal congestion and nasal saline spray and OTC eye drops as needed for dry eyes and dry mouth.  I will call you with the results of your blood work when they are available. I have referred you to [facility] and for a mammography. You should receive a call to schedule these appointments within the next few weeks.   Please don't hesitate to call with any questions or concerns: [phone]. Please call to schedule a follow up appointment in 1 month. Please go to the ED if you experience worsening cough or SOB that do not respond to 4 puffs of your albuterol inhaler.   Nice to meet you,  Dr. [facility]   Chronic Obstructive Pulmonary Disease Chronic obstructive pulmonary disease (COPD) is a long-term (chronic) lung problem. When you have COPD, it is hard for air to get in and out of your lungs. Usually the condition gets worse over time, and your lungs will never  return to normal. There are things you can do to keep yourself as healthy as possible.  Your doctor may treat your condition with: ? Medicines. ? Oxygen. ? Lung surgery.  Your doctor may also recommend: ? Rehabilitation. This includes steps to make your body work better. It may involve a team of specialists. ? Quitting smoking, if you smoke. ? Exercise and changes to your diet. ? Comfort measures (palliative care). Follow these instructions at home: Medicines  Take over-the-counter and prescription medicines only as told by your doctor.  Talk to your doctor before taking any cough or allergy medicines. You may need to avoid medicines that cause your lungs to be dry. Lifestyle  If you smoke, stop. Smoking makes the problem worse. If you need help quitting, ask your doctor.  Avoid being around things that make your breathing worse. This may include smoke, chemicals, and fumes.  Stay active, but remember to rest as well.  Learn and use tips on how to relax.  Make sure you get enough sleep. Most adults need at least 7 hours of sleep every night.  Eat healthy foods. Eat smaller meals more often. Rest before meals. Controlled breathing Learn and use tips on how to control your breathing as told by your doctor. Try:  Breathing in (inhaling) through your nose for 1 second. Then, pucker your lips and breath out (exhale) through your lips for 2 seconds.  Putting one hand on your belly (abdomen). Breathe in slowly through your nose for 1 second. Your hand on your belly should move out. Pucker your lips and breathe out slowly through your lips. Your hand on your belly should move in as you breathe out.  Controlled coughing Learn and use controlled coughing to clear mucus from your lungs. Follow these steps: 1. Lean your head a little forward. 2. Breathe in deeply. 3. Try to hold your breath for 3 seconds. 4. Keep your mouth slightly open while coughing 2 times. 5. Spit any mucus out  into a tissue. 6. Rest and do the steps again 1 or 2 times as needed. General instructions  Make sure you get all the shots (vaccines) that your doctor recommends. Ask your doctor about a flu shot and a pneumonia shot.  Use oxygen therapy and pulmonary rehabilitation if told by your doctor. If you need home oxygen therapy, ask your doctor if you should buy a tool to measure your oxygen level (oximeter).  Make a COPD action plan with your doctor. This helps you to know what to do if you feel worse than usual.  Manage any other conditions you have as told by your doctor.  Avoid going outside when it is very hot, cold, or humid.  Avoid people who have a sickness you can catch (contagious).  Keep all follow-up visits as told by your doctor. This is important. Contact a doctor if:  You cough up more mucus than usual.  There is a change in the color or thickness of the mucus.  It is harder to breathe than usual.  Your breathing is faster than usual.  You have trouble sleeping.  You need to use your medicines more often than usual.  You have trouble doing your normal activities such as getting dressed or walking around the house. Get help right away if:  You have shortness of breath while resting.  You have shortness of breath that stops you from: ? Being able to talk. ? Doing normal activities.  Your chest hurts for longer than 5 minutes.  Your skin color is more blue than usual.  Your pulse oximeter shows that you have low oxygen for longer than 5 minutes.  You have a fever.  You feel too tired to breathe normally. Summary  Chronic obstructive pulmonary disease (COPD) is a long-term lung problem.  The way your lungs work will never return to normal. Usually the condition gets worse over time. There are things you can do to keep yourself as healthy as possible.  Take over-the-counter and prescription medicines only as told by your doctor.  If you smoke, stop.  Smoking makes the problem worse. This information is not intended to replace advice given to you by your health care provider. Make sure you discuss any questions you have with your health care provider. Document Revised: 09/15/2017 Document Reviewed: 11/07/2016 Elsevier Patient Education  2020 [employer].

## 2020-08-10 NOTE — Assessment & Plan Note (Signed)
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 She has not had further follow up with OB GYN. She also has a history of abnormal bleeding and ovarian cyst though denies any abnormal bleeding at this time.  - Will sent referral to OB GYN.

## 2020-08-10 NOTE — Assessment & Plan Note (Signed)
Patient endorses heartburn symptoms that have improved after she was started on Nexium ER 20mg  daily and Mylanta after ED visit this month. She endorses a history of similar heartburn and has been taking NSAIDs in excess recently.  - Continue Nexium ER 20mg  daily - Continue Mylanta PRN - Advised decrease in NSAID use

## 2020-08-10 NOTE — Progress Notes (Signed)
   CC: cough  HPI:  [name] is a 44 y.o. obese female with PMHx COPD, TUD, Sjogren's syndrome, IDA, CIN III, and depression, presenting to re-establish care with chief complaint of cough. She states her cough has been ongoing for about 3 months. It is productive with clear mucus, and she endorses SOB mainly when laying flat at night and with exertion. She has been seen in the ED twice in the past two months for the same symptoms and was given solumedrol and discharged with steroids, albuterol inhaler, and Flovent. She states she has been using her Flovent nightly and albuterol once in the morning and once at night with improvement in her breathing and cough. She says her mother has noticed her legs and feet have been swelling but she denies any changes recently. She also endorses seasonal allergies with nasal congestion and dry nose and eyes in the setting of Sjogren's syndrome. She does not take any medications for these at home. She admits to taking excessive doses of Tylenol and Ibuprofen for chronic radiculopathy pain and notes she has also been taking non-prescribed Vicodin. She notes mild heartburn but states this has significantly improved with Nexium and Mylanta prescribed to her on her recent ED visit. She endorses chronic hemorrhoids, dark green, runny stools, chronic diffuse myalgias and muscle spasms in her back, but denies any blood in her stools or mucus, dark stools, limitations in daily functioning, or any other symptoms.   Past Medical History:  Diagnosis Date  . Asthma   . Carpal tunnel syndrome   . CIN III (cervical intraepithelial neoplasia III)   . COPD (chronic obstructive pulmonary disease) (HCC)   . Hemorrhoids   . Iron deficiency anemia   . Sjogren's syndrome (HCC)    Review of Systems:  All others negative except as noted above in HPI.  Physical Exam:  Vitals:   08/10/20 0957  BP: 119/64  Pulse: 90  Temp: 98.6 F (37 C)  TempSrc: Oral  SpO2: 100%    Weight: 176 lb 1.6 oz (79.9 kg)   General: Patient is obese. She appears well. No acute distress. Eyes: Sclera non-icteric. No conjunctival injection.  HENT: Bilateral nasal turbinates are mildly erythematous and not edematous, with minimal discharge. There is scarring of the bilateral TM's without bulging or erythema.  Respiratory: Lungs are CTA, bilaterally. No wheezes, rales, or rhonchi. Saturating well on room air. Cardiovascular: Regular rate and rhythm. No murmurs, rubs, or gallops. Trace lower extremity pitting edema. No JVD. Abdominal: Soft and non-distended with mild epigastric tenderness to palpation without rebound or guarding.  Musculoskeletal: There is mild TTP of bilateral shins. Muscle bulk and tone normal.  Neurological: Strength is 5/5 in all extremities. Sensation intact throughout. Skin: No lesions. No rashes.  Psych: Normal affect. Normal tone of voice.   Assessment & Plan:   See Encounters Tab for problem based charting.  Healthcare maintenance: Patient received her flu shot today.  Referrals were sent for breast cancer screening via bilateral screening mammogram and OB GYN for cervical cancer screening and CIN / AUB follow-up.   Patient seen with Dr. Criselda Peaches.  Glenford Bayley, MD 08/10/2020, 1:07 PM Pager: [phone]

## 2020-08-10 NOTE — Assessment & Plan Note (Signed)
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 On no medications and has been referred to rheumatology though has not followed up.  - Advised patient to start using OTC nasal saline and artificial tears - Will also start Flonase daily

## 2020-08-10 NOTE — Assessment & Plan Note (Signed)
Patient endorses a history of seasonal allergies and currently is experiencing nasal congestion. - Will start OTC Flonase 2 sprays each nostril daily

## 2020-08-10 NOTE — Assessment & Plan Note (Signed)
Per chart review, patient has a history of severe depression with normal TSH and vitamin B12. She was started on Sertraline daily although has not been taking this medication for quite some time as she ran out of her prescriptions. PHQ-9 of 3 today, making history of adjustment disorder more likely.  - Will start Cymbalta 20mg  daily - Continue PHQ-9 monitoring

## 2020-08-11 ENCOUNTER — Telehealth: Payer: Self-pay | Admitting: Student

## 2020-08-11 LAB — LIPID PANEL
Chol/HDL Ratio: 8 ratio — ABNORMAL HIGH (ref 0.0–4.4)
Cholesterol, Total: 280 mg/dL — ABNORMAL HIGH (ref 100–199)
HDL: 35 mg/dL — ABNORMAL LOW (ref >39)
LDL Chol Calc (NIH): 179 mg/dL — ABNORMAL HIGH (ref 0–99)
Triglycerides: 337 mg/dL — ABNORMAL HIGH (ref 0–149)
VLDL Cholesterol Cal: 66 mg/dL — ABNORMAL HIGH (ref 5–40)

## 2020-08-11 LAB — IRON AND TIBC
Iron Saturation: 39 % (ref 15–55)
Iron: 133 ug/dL (ref 27–159)
Total Iron Binding Capacity: 337 ug/dL (ref 250–450)
UIBC: 204 ug/dL (ref 131–425)

## 2020-08-11 LAB — VITAMIN B12: Vitamin B-12: 1189 pg/mL (ref 232–1245)

## 2020-08-11 LAB — FERRITIN: Ferritin: 68 ng/mL (ref 15–150)

## 2020-08-11 NOTE — Telephone Encounter (Signed)
Left message for patient on mobile device that her cholesterol returned high, although without other risk factors (ASCVD 2.4%) I will hold off on adding any medications at this time. I instructed her stop taking iron supplements and B12 supplements given her labs have returned within normal limits. Told patient to call back [facility] with any questions or concerns.  Christon Gallaway, PGY1 Internal Medicine [phone]

## 2020-08-11 NOTE — Progress Notes (Signed)
Left message for patient on mobile device that her cholesterol returned high, although without other risk factors (ASCVD 2.4%) I will hold off on adding any medications at this time. I instructed her stop taking iron supplements and B12 supplements given her labs have returned within normal limits. Told patient to call back [facility] with any questions or concerns.  Glenford Bayley, PGY1 Internal Medicine [phone]

## 2020-08-16 ENCOUNTER — Emergency Department (HOSPITAL_COMMUNITY)
Admission: EM | Admit: 2020-08-16 | Discharge: 2020-08-16 | Disposition: A | Discharge status: Home / Self Care | Payer: Medicaid Other | Attending: Emergency Medicine | Admitting: Emergency Medicine

## 2020-08-16 ENCOUNTER — Encounter (HOSPITAL_COMMUNITY): Payer: Self-pay | Admitting: Emergency Medicine

## 2020-08-16 ENCOUNTER — Other Ambulatory Visit: Payer: Self-pay

## 2020-08-16 DIAGNOSIS — J45909 Unspecified asthma, uncomplicated: Secondary | ICD-10-CM | POA: Insufficient documentation

## 2020-08-16 DIAGNOSIS — F17210 Nicotine dependence, cigarettes, uncomplicated: Secondary | ICD-10-CM | POA: Insufficient documentation

## 2020-08-16 DIAGNOSIS — K0889 Other specified disorders of teeth and supporting structures: Secondary | ICD-10-CM | POA: Insufficient documentation

## 2020-08-16 DIAGNOSIS — J449 Chronic obstructive pulmonary disease, unspecified: Secondary | ICD-10-CM | POA: Insufficient documentation

## 2020-08-16 DIAGNOSIS — K029 Dental caries, unspecified: Secondary | ICD-10-CM

## 2020-08-16 MED ORDER — CLINDAMYCIN HCL 300 MG PO CAPS: 300.0000 mg | ORAL_CAPSULE | Freq: Four times a day (QID) | ORAL | 0 refills | Status: DC

## 2020-08-16 MED ORDER — HYDROCODONE-ACETAMINOPHEN 5-325 MG PO TABS
1.0000 | ORAL_TABLET | Freq: Four times a day (QID) | ORAL | 0 refills | Status: DC | PRN
Start: 2020-08-16 — End: 2020-09-07

## 2020-08-16 NOTE — Discharge Instructions (Addendum)
Begin taking Clindamycin as prescribed.    Take ibuprofen 600 mg every six hours as needed for pain.  Take hydrocodone as prescribed as needed for pain not relieved with ibuprofen.  Follow up with dentistry in the next few days.

## 2020-08-16 NOTE — ED Triage Notes (Signed)
Pt c/o dental pain and abscess to left side of mouth x 2 days.

## 2020-08-16 NOTE — ED Provider Notes (Signed)
[facility] EMERGENCY DEPARTMENT Provider Note   CSN: [id number] Arrival date & time: 08/16/20  1550     History Chief Complaint  Patient presents with  . Dental Pain    [name] is a 44 y.o. female.  Patient is a 44 year old female with history of COPD presenting with complaints of dental pain and swelling to the left upper molars.  Pain is worse with eating, drinking, and hot and cold.    The history is provided by the patient.  Dental Pain Location:  Upper Upper teeth location:  14/LU 1st molar and 15/LU 2nd molar Quality:  Throbbing Severity:  Moderate Duration:  3 days Timing:  Constant Progression:  Worsening Relieved by:  Nothing Worsened by:  Nothing      Past Medical History:  Diagnosis Date  . Asthma   . Carpal tunnel syndrome   . CIN III (cervical intraepithelial neoplasia III)   . COPD (chronic obstructive pulmonary disease) (HCC)   . Hemorrhoids   . Iron deficiency anemia   . Sjogren's syndrome [facility])     Patient Active Problem List   Diagnosis Date Noted  . CIN III (cervical intraepithelial neoplasia III) 08/10/2020  . Radiculopathy 08/10/2020  . Heartburn 08/10/2020  . Abdominal pain 11/16/2016  . Sjogren's syndrome (HCC) 11/16/2016  . Severe depression (HCC) 11/16/2016  . Chronic arm pain 01/01/2016  . Abnormal menstrual periods 07/13/2015  . Iron deficiency anemia 03/18/2015  . Ovarian cyst 02/20/2015  . LLQ abdominal tenderness 01/27/2015  . Seasonal allergies 01/27/2015  . Tobacco abuse 01/12/2015  . COPD (chronic obstructive pulmonary disease) (HCC) 01/12/2015    Past Surgical History:  Procedure Laterality Date  . CESAREAN SECTION    . Tubal ligation       OB History    Gravida  4   Para  2   Term  1   Preterm  1   AB  2   Living        SAB  2   TAB      Ectopic      Multiple      Live Births              Family History  Problem Relation Age of Onset  . Diabetes Mother   .  Diabetes Father     Social History   Tobacco Use  . Smoking status: Current Every Day Smoker    Packs/day: 0.50    Types: Cigarettes  . Smokeless tobacco: Never Used  . Tobacco comment: trying to cut back - on Chantrix.  Substance Use Topics  . Alcohol use: Yes    Alcohol/week: 0.0 standard drinks    Comment: Occasionally.  . Drug use: No    Home Medications Prior to Admission medications   Medication Sig Start Date End Date Taking? Authorizing Provider  acetaminophen (TYLENOL) 500 MG tablet Take 500 mg by mouth as needed. Reported on 11/25/2015    [provider]  albuterol (PROAIR HFA) 108 (90 Base) MCG/ACT inhaler INHALE 1 TO 2 PUFFS INTO LUNGS EVERY 6 HOURS AS NEEDED FOR WHEEZING OR SHORTNESS OF BREATH 08/10/20   Glenford Bayley, MD  DULoxetine (CYMBALTA) 20 MG capsule Take 1 capsule (20 mg total) by mouth daily. 08/10/20   Glenford Bayley, MD  esomeprazole (NEXIUM) 20 MG capsule Take 1 capsule (20 mg total) by mouth daily at 12 noon. 08/10/20   Glenford Bayley, MD  ferrous sulfate 325 (65 FE) MG tablet Take 325  mg by mouth daily with breakfast.    [provider]  fluticasone (FLONASE) 50 MCG/ACT nasal spray instill 2 sprays into each nostril once daily 11/22/16   Valentino Nose, MD  ibuprofen (ADVIL) 200 MG tablet Take 2 tablets (400 mg total) by mouth every 6 (six) hours as needed. 08/10/20   Glenford Bayley, MD  Multiple Vitamins-Minerals (MULTIVITAMIN WITH MINERALS) tablet Take 1 tablet by mouth daily.    [provider]  nicotine (NICODERM CQ - DOSED IN MG/24 HOURS) 14 mg/24hr patch Place 1 patch (14 mg total) onto the skin daily for 16 days. Start taking after 2 weeks of 21mg  patches. 08/10/20 08/26/20  13/10/21, MD  nicotine (NICODERM CQ - DOSED IN MG/24 HOURS) 21 mg/24hr patch Place 1 patch (21 mg total) onto the skin daily. 08/10/20   08/12/20, MD  Tiotropium Bromide Monohydrate (SPIRIVA RESPIMAT) 2.5 MCG/ACT AERS Inhale 2 puffs  into the lungs daily. 08/10/20   08/12/20, MD  vitamin B-12 (CYANOCOBALAMIN) 100 MCG tablet Take 100 mcg by mouth daily.    [provider]  vitamin E 200 UNIT capsule Take 200 Units by mouth daily.    [provider]    Allergies    Patient has no known allergies.  Review of Systems   Review of Systems  All other systems reviewed and are negative.   Physical Exam Updated Vital Signs BP (!) 160/100 (BP Location: Left Arm)   Pulse 83   Temp 98.3 F (36.8 C) (Oral)   Resp 18   LMP 07/12/2020   SpO2 97%   Physical Exam Vitals and nursing note reviewed.  Constitutional:      General: She is not in acute distress.    Appearance: She is well-developed. She is not diaphoretic.  HENT:     Head: Normocephalic and atraumatic.     Mouth/Throat:     Comments: Patient with poor dentition.  There is swelling of the gingiva adjacent to the left upper molars.  No obvious abscess.   Pulmonary:     Effort: Pulmonary effort is normal.  Musculoskeletal:        General: Normal range of motion.     Cervical back: Normal range of motion and neck supple.  Lymphadenopathy:     Cervical: No cervical adenopathy.  Skin:    General: Skin is warm and dry.  Neurological:     Mental Status: She is alert and oriented to person, place, and time.     ED Results / Procedures / Treatments   Labs (all labs ordered are listed, but only abnormal results are displayed) Labs Reviewed - No data to display  EKG None  Radiology No results found.  Procedures Procedures (including critical care time)  Medications Ordered in ED Medications - No data to display  ED Course  I have reviewed the triage vital signs and the nursing notes.  Pertinent labs & imaging results that were available during my care of the patient were reviewed by me and considered in my medical decision making (see chart for details).    MDM Rules/Calculators/A&P  Patient with dental pain/infection.   She will be treated with clindamycin, ibuprofen, and pain medication.  She is to follow-up with dentistry in the next few days.  Final Clinical Impression(s) / ED Diagnoses Final diagnoses:  None    Rx / DC Orders ED Discharge Orders    None       07/14/2020, MD 08/16/20 1658

## 2020-08-17 NOTE — Progress Notes (Signed)
Internal Medicine Clinic Attending  I saw and evaluated the patient.  I personally confirmed the key portions of the history and exam documented by Dr. Speakman and I reviewed pertinent patient test results.  The assessment, diagnosis, and plan were formulated together and I agree with the documentation in the resident's note.  

## 2020-09-07 ENCOUNTER — Other Ambulatory Visit: Payer: Self-pay

## 2020-09-07 ENCOUNTER — Ambulatory Visit: Payer: Medicaid Other | Admitting: Internal Medicine

## 2020-09-07 ENCOUNTER — Telehealth: Payer: Self-pay | Admitting: *Deleted

## 2020-09-07 ENCOUNTER — Encounter: Payer: Self-pay | Admitting: Internal Medicine

## 2020-09-07 DIAGNOSIS — M797 Fibromyalgia: Secondary | ICD-10-CM

## 2020-09-07 DIAGNOSIS — J449 Chronic obstructive pulmonary disease, unspecified: Secondary | ICD-10-CM | POA: Diagnosis not present

## 2020-09-07 DIAGNOSIS — W19XXXA Unspecified fall, initial encounter: Secondary | ICD-10-CM | POA: Insufficient documentation

## 2020-09-07 DIAGNOSIS — M5410 Radiculopathy, site unspecified: Secondary | ICD-10-CM | POA: Diagnosis not present

## 2020-09-07 MED ORDER — DULOXETINE HCL 60 MG PO CPEP: 60.0000 mg | ORAL_CAPSULE | Freq: Every day | ORAL | 5 refills | Status: DC

## 2020-09-07 MED ORDER — VARENICLINE TARTRATE 0.5 MG X 11 & 1 MG X 42 PO MISC: ORAL | 0 refills | Status: DC

## 2020-09-07 MED ORDER — BUPROPION HCL ER (SR) 150 MG PO TB12: ORAL_TABLET | ORAL | 0 refills | Status: DC

## 2020-09-07 NOTE — Progress Notes (Signed)
   CC: Follow up of COPD, back pain, and recent fall  HPI:  [name] is a 44 y.o. with the history listed below presenting for follow up of her COPD, back pain, and recent fall.   Past Medical History:  Diagnosis Date  . Asthma   . Carpal tunnel syndrome   . CIN III (cervical intraepithelial neoplasia III)   . COPD (chronic obstructive pulmonary disease) (HCC)   . Hemorrhoids   . Iron deficiency anemia   . Sjogren's syndrome (HCC)    Review of Systems:   Constitutional: Negative for chills and fever.  Respiratory: Negative for shortness of breath.   Cardiovascular: Negative for chest pain and leg swelling.  Gastrointestinal: Negative for abdominal pain, nausea and vomiting.  Neurological: Negative for dizziness and headaches.    Physical Exam:  Vitals:   09/07/20 0859  BP: (!) 148/92  Pulse: (!) 102  Temp: 98.7 F (37.1 C)  TempSrc: Oral  SpO2: 99%  Weight: 171 lb 3.2 oz (77.7 kg)  Height: 5\' 2"  (1.575 m)   Physical Exam Constitutional:      Appearance: She is obese.  HENT:     Head: Normocephalic and atraumatic.     Nose: Nose normal.     Mouth/Throat:     Mouth: Mucous membranes are moist.     Pharynx: Oropharynx is clear.  Eyes:     Extraocular Movements: Extraocular movements intact.     Conjunctiva/sclera: Conjunctivae normal.     Pupils: Pupils are equal, round, and reactive to light.  Cardiovascular:     Rate and Rhythm: Regular rhythm. Tachycardia present.     Pulses: Normal pulses.     Heart sounds: Normal heart sounds.  Pulmonary:     Effort: Pulmonary effort is normal. No respiratory distress.     Breath sounds: Normal breath sounds. No wheezing.  Abdominal:     General: Abdomen is flat. Bowel sounds are normal.     Palpations: Abdomen is soft.  Musculoskeletal:     Cervical back: Normal range of motion. Tenderness (Tenderness to palpation over neck) present. No swelling.     Thoracic back: Tenderness present. No swelling.     Lumbar  back: Tenderness present. No swelling.       Back:  Skin:    General: Skin is warm and dry.     Capillary Refill: Capillary refill takes less than 2 seconds.  Neurological:     General: No focal deficit present.     Mental Status: She is alert and oriented to person, place, and time.  Psychiatric:        Mood and Affect: Mood normal.        Behavior: Behavior normal.      Assessment & Plan:   See Encounters Tab for problem based charting.  Patient discussed with Dr. 

## 2020-09-07 NOTE — Telephone Encounter (Signed)
Sent in prescription for Bupropion and spoke with patient. Thank you.

## 2020-09-07 NOTE — Telephone Encounter (Signed)
chantix is no longer available in any form or fashion per pharmacist at [facility]. It has been taken off the market.

## 2020-09-07 NOTE — Assessment & Plan Note (Signed)
Patient is currently on Spiriva 2.5 mcg 2 puffs daily, albuterol PRN. She reports that her breathing is doing a lot better now, she denies any SOB, cough, fevers, chills, nausea, vomiting, chest pain or other symptoms.  She is currently using her albuterol about 2 times per day.  She was requesting a refill of her Chantix because she felt like that was helpful for her smoking cessation.  She appears comfortable on exam and her lungs are CTABL.  Had sent in a prescription for Chantix however Chantix is being recalled, inform her pharmacist that there is no Chantix at the pharmacy.  Contacted patient and recommended trial of Bupropion for smoking cessation and she is agreement.  -Continue Spiriva 2.5 mcg 2 puffs daily -Continue albuterol as needed -May need to switch to [employer] in future based on her insurance -Trial of bupropion 150 mg daily x3 days, then 150 mg BID

## 2020-09-07 NOTE — Assessment & Plan Note (Signed)
Patient states that she had a fall last night when she was cleaning up her house, she slipped on some water and hit the front of her forehead, she denies any loss of consciousness or presyncopal symptoms.  She denied any vision changes, weakness, nausea, vomiting, chest pain, palpitations, numbness, tingling, or other symptoms.  She endorses a slight headache which she has been taking Tylenol for.  Neurological exam is unremarkable.  Seems to be more consistent with mechanical fall.  She is not on any blood thinners.  Advised to monitor for now inform us that she develops any neurological changes.

## 2020-09-07 NOTE — Progress Notes (Signed)
Internal Medicine Clinic Attending ° °Case discussed with Dr. Krienke  At the time of the visit.  We reviewed the resident’s history and exam and pertinent patient test results.  I agree with the assessment, diagnosis, and plan of care documented in the resident’s note.  °

## 2020-09-07 NOTE — Assessment & Plan Note (Signed)
Patient reports having chronic back pain for about 4 years now.  It is along her entire back and radiates down to both of her legs.  She states that she had a fall around that time. The pain is worse with walking and laying on her back, located on both her shoulders and all the way down to her hips.  She was reports that the pain radiated down to the back of her legs.  Pain does not change throughout the day.  It is currently a 10 out of 10, with medications it does goes down to 8 out of 10.  She states alternating between ibuprofen and Tylenol, takes about 20 pills/day.  She has been taking the Cymbalta however states that it has not helped.  She states that she cannot spend a lot of time with her children due to the pain.  She is able to do her chores around the house however states having pain with this is.  She denied any weight loss, fevers, weakness, nausea, vomiting, urinary or bladder incontinence.    On exam she has tenderness along her entire back, along both the muscles and the spinous processes, no localized areas of pain.  She has no rash, lesions, or swelling.  Strength and sensation are intact.  Patient also endorses severe fatigue, moderate considerable problems with waking feeling unrefreshed, slight or mild mild problems with cognitive symptoms.  She also endorses occasional pain or cramps in the lower abdomen, depression, and headaches. Patient is currently endorsing chronic back pain of unclear etiology, unremarkable neurological exam is reassuring.  She has a widespread pain index of 5 and a symptom severity score of 9.  This seems to be consistent with fibromyalgia.   -Increase Cymbalta to 60 mg daily -Advised to increase exercise and weight loss -Advised to limit tylenol and ibuprofen use -Consider referral to [facility] if patient is interested

## 2020-09-07 NOTE — Patient Instructions (Signed)
Ms. [name],  It was a pleasure to see you today. Thank you for coming in.   Today we discussed your chronic back pain and recent fall. Your neurological exam today was reassuring. In regards to your back pain, please increase the Cymbalta to 60 mg daily. Continue using heating pads on the area.    We also discussed your COPD and smoking. I am glad that this is doing better. Please continue your current medications. I have sent in a prescription for Chantix.   Please return to clinic in 3 months or sooner if needed.   Thank you again for coming in.   Claudean Severance.D.

## 2020-09-15 ENCOUNTER — Encounter: Payer: Self-pay | Admitting: *Deleted

## 2020-10-05 ENCOUNTER — Other Ambulatory Visit: Payer: Self-pay | Admitting: Student

## 2020-10-05 DIAGNOSIS — M5410 Radiculopathy, site unspecified: Secondary | ICD-10-CM

## 2020-10-31 DIAGNOSIS — Z20822 Contact with and (suspected) exposure to covid-19: Secondary | ICD-10-CM | POA: Diagnosis not present

## 2020-11-03 ENCOUNTER — Encounter: Payer: Self-pay | Admitting: Obstetrics & Gynecology

## 2020-11-04 ENCOUNTER — Encounter: Payer: Medicaid Other | Admitting: Obstetrics & Gynecology

## 2020-11-04 NOTE — Addendum Note (Signed)
Addended by: Neomia Dear on: 11/04/2020 05:38 PM   Modules accepted: Orders

## 2020-11-09 DIAGNOSIS — Z20822 Contact with and (suspected) exposure to covid-19: Secondary | ICD-10-CM | POA: Diagnosis not present

## 2020-11-17 DIAGNOSIS — Z20822 Contact with and (suspected) exposure to covid-19: Secondary | ICD-10-CM | POA: Diagnosis not present

## 2020-11-25 ENCOUNTER — Other Ambulatory Visit: Payer: Self-pay

## 2020-11-25 ENCOUNTER — Ambulatory Visit: Payer: Medicaid Other | Admitting: Student

## 2020-11-25 ENCOUNTER — Ambulatory Visit (HOSPITAL_COMMUNITY)
Admission: RE | Admit: 2020-11-25 | Discharge: 2020-11-25 | Disposition: A | Discharge status: Home / Self Care | Payer: Medicaid Other | Source: Ambulatory Visit | Attending: Internal Medicine | Admitting: Internal Medicine

## 2020-11-25 ENCOUNTER — Encounter: Payer: Self-pay | Admitting: Student

## 2020-11-25 VITALS — BP 141/88 | HR 119 | Temp 98.6°F | Wt 168.5 lb

## 2020-11-25 DIAGNOSIS — K219 Gastro-esophageal reflux disease without esophagitis: Secondary | ICD-10-CM

## 2020-11-25 DIAGNOSIS — S99911A Unspecified injury of right ankle, initial encounter: Secondary | ICD-10-CM | POA: Diagnosis not present

## 2020-11-25 DIAGNOSIS — N926 Irregular menstruation, unspecified: Secondary | ICD-10-CM

## 2020-11-25 DIAGNOSIS — F322 Major depressive disorder, single episode, severe without psychotic features: Secondary | ICD-10-CM

## 2020-11-25 DIAGNOSIS — M5410 Radiculopathy, site unspecified: Secondary | ICD-10-CM

## 2020-11-25 DIAGNOSIS — M797 Fibromyalgia: Secondary | ICD-10-CM

## 2020-11-25 DIAGNOSIS — J449 Chronic obstructive pulmonary disease, unspecified: Secondary | ICD-10-CM | POA: Diagnosis not present

## 2020-11-25 DIAGNOSIS — M25571 Pain in right ankle and joints of right foot: Secondary | ICD-10-CM | POA: Diagnosis not present

## 2020-11-25 DIAGNOSIS — J302 Other seasonal allergic rhinitis: Secondary | ICD-10-CM

## 2020-11-25 LAB — POCT URINE PREGNANCY: Preg Test, Ur: NEGATIVE

## 2020-11-25 MED ORDER — ALBUTEROL SULFATE HFA 108 (90 BASE) MCG/ACT IN AERS: INHALATION_SPRAY | RESPIRATORY_TRACT | 1 refills | Status: DC

## 2020-11-25 MED ORDER — ESOMEPRAZOLE MAGNESIUM 20 MG PO CPDR: 20.0000 mg | DELAYED_RELEASE_CAPSULE | Freq: Every day | ORAL | 1 refills | Status: DC

## 2020-11-25 MED ORDER — MIRTAZAPINE 15 MG PO TABS: 15.0000 mg | ORAL_TABLET | Freq: Every day | ORAL | 2 refills | Status: DC

## 2020-11-25 MED ORDER — SPIRIVA RESPIMAT 2.5 MCG/ACT IN AERS
2.0000 | INHALATION_SPRAY | Freq: Every day | RESPIRATORY_TRACT | Status: DC
Start: 2020-11-25 — End: 2020-12-23

## 2020-11-25 MED ORDER — FLUTICASONE PROPIONATE 50 MCG/ACT NA SUSP: NASAL | 0 refills | Status: DC

## 2020-11-25 MED ORDER — DULOXETINE HCL 60 MG PO CPEP: 60.0000 mg | ORAL_CAPSULE | Freq: Every day | ORAL | 5 refills | Status: DC

## 2020-11-25 NOTE — Patient Instructions (Signed)
[name],  It was a pleasure meeting you in clinic today.  For your depression: I have refilled your duloxetine 60mg  daily, please continue to take this medication every morning. Also, I have placed a prescription for mirtazapine 15mg  for you to take every evening. I have also placed a referral to integrated behavioral [facility] who will give you a call.  For your ankle pain: I have ordered an ankle radiograph which can be completed today.  I have also refilled your medications.  Sincerely, Dr. , MD

## 2020-11-25 NOTE — Progress Notes (Signed)
   CC: Follow-up, ankle pain, depression  HPI:  [name] is a 45 y.o. with past medical history significant for severe depression, fibromyalgia who presents to clinic for routine follow-up. Refer to problem list for charting of this encounter.  Past Medical History:  Diagnosis Date  . Asthma   . Carpal tunnel syndrome   . CIN III (cervical intraepithelial neoplasia III) 2017   Seen on colposcopy. 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 COPD (chronic obstructive pulmonary disease) (HCC)   . Hemorrhoids   . Iron deficiency anemia   . Sjogren's syndrome (HCC)    Review of Systems:  Endorses depression, anhedonia, difficulty falling/staying asleep, poor appetite, right ankle pain. Denies suicidal, homicidal ideation, ankle swelling.  Physical Exam:  Vitals:   11/25/20 0901  BP: (!) 141/88  Pulse: (!) 119  Temp: 98.6 F (37 C)  TempSrc: Oral  SpO2: 99%  Weight: 168 lb 8 oz (76.4 kg)  Physical Exam Cardiovascular:     Rate and Rhythm: Regular rhythm. Tachycardia present.     Pulses: Normal pulses.     Heart sounds: Normal heart sounds.  Pulmonary:     Effort: Pulmonary effort is normal. No respiratory distress.     Breath sounds: Normal breath sounds.  Abdominal:     General: Abdomen is flat. Bowel sounds are normal.     Palpations: Abdomen is soft.     Tenderness: There is no abdominal tenderness.  Neurological:     Mental Status: She is alert.  Psychiatric:        Attention and Perception: Attention and perception normal.        Mood and Affect: Mood is depressed. Affect is tearful.        Speech: Speech normal.        Behavior: Behavior normal. Behavior is cooperative.        Thought Content: Thought content normal.        Cognition and Memory: Cognition and memory normal.        Judgment: Judgment normal.      Assessment & Plan:   See Encounters Tab for problem based charting.  Patient discussed with Dr. Mayford Knife

## 2020-11-25 NOTE — Assessment & Plan Note (Addendum)
SUBJECTIVE: Patient reports that she has been under a lot of stress at home. She states, "All I want to do is cry and scream." She reports that her husband has not been talking with her and when he does they fight often. She reports that her 44 year old daughter recently gave birth to a daughter which has been overwhelming. She has been having trouble falling asleep and staying asleep. She reports poor appetite and minimal oral intake. She denies suicidal, homicidal ideation. Following discussing her symptoms, patient reports that she feels much better.  OBJECTIVE: Psych: Tearful. PHQ9 of 20  ASSESSMENT/PLAN: Patient with history of severe depression with continued poor control of her symptoms. Patient would benefit from escalation of pharmacologic and nonpharmacologic interventions. -Referral to [facility] -Continue duloxetine 60mg  daily every morning -Start mirtazapine 15mg  nightly  -If continues to have poor control, may benefit from psychiatry referral

## 2020-11-25 NOTE — Assessment & Plan Note (Signed)
SUBJECTIVE: Patient reports ankle pain following tripping last night. She is able to bear weight but has significant pain with any contact to the ankle. She denies any swelling or bruising. No prior fractures of the ankle.  OBJECTIVE: Skin: No bruising, erythema MSK: Significant tenderness to palpation of the right lateral malleous. No swelling or deformity. Full ROM.  ASSESSMENT/PLAN: Patient's subjective and objective findings concerning for possible ankle fracture. [place] ankle rules indicate that patient would benefit from an ankle radiograph -Ankle radiograph now

## 2020-11-26 ENCOUNTER — Telehealth: Payer: Self-pay | Admitting: Behavioral Health

## 2020-11-26 ENCOUNTER — Telehealth: Payer: Self-pay | Admitting: Internal Medicine

## 2020-11-26 NOTE — Telephone Encounter (Signed)
Contacted Pt to introduce Clinician for future work in psychotherapy. Pt has no VMbox set up. Clinician unable to leave a msg.  Dr. Monna Fam

## 2020-11-26 NOTE — Telephone Encounter (Signed)
Pt was called to see if she would like sch covid vaccine pt stated she already had all of that and disconnect the call

## 2020-11-27 NOTE — Progress Notes (Signed)
Internal Medicine Clinic Attending  Case discussed with Dr. Johnson  At the time of the visit.  We reviewed the resident's history and exam and pertinent patient test results.  I agree with the assessment, diagnosis, and plan of care documented in the resident's note.  

## 2020-12-02 ENCOUNTER — Encounter: Payer: Self-pay | Admitting: Internal Medicine

## 2020-12-15 ENCOUNTER — Institutional Professional Consult (permissible substitution): Payer: Medicaid Other | Admitting: Behavioral Health

## 2020-12-15 ENCOUNTER — Telehealth: Payer: Self-pay | Admitting: Behavioral Health

## 2020-12-15 ENCOUNTER — Other Ambulatory Visit: Payer: Self-pay

## 2020-12-15 NOTE — Telephone Encounter (Signed)
Tried contacting Pt several times for 11:00am appt. Unable to leave msg per mobile msg.  Dr. Monna Fam

## 2020-12-23 ENCOUNTER — Encounter: Payer: Self-pay | Admitting: Internal Medicine

## 2020-12-23 ENCOUNTER — Ambulatory Visit: Payer: Medicaid Other | Admitting: Internal Medicine

## 2020-12-23 ENCOUNTER — Other Ambulatory Visit (HOSPITAL_COMMUNITY)
Admission: RE | Admit: 2020-12-23 | Discharge: 2020-12-23 | Disposition: A | Discharge status: Home / Self Care | Payer: Medicaid Other | Source: Ambulatory Visit | Attending: Internal Medicine | Admitting: Internal Medicine

## 2020-12-23 ENCOUNTER — Other Ambulatory Visit: Payer: Self-pay

## 2020-12-23 VITALS — BP 123/80 | HR 94 | Temp 98.2°F | Ht 62.0 in | Wt 169.1 lb

## 2020-12-23 DIAGNOSIS — E785 Hyperlipidemia, unspecified: Secondary | ICD-10-CM | POA: Insufficient documentation

## 2020-12-23 DIAGNOSIS — J449 Chronic obstructive pulmonary disease, unspecified: Secondary | ICD-10-CM

## 2020-12-23 DIAGNOSIS — Z720 Tobacco use: Secondary | ICD-10-CM | POA: Diagnosis not present

## 2020-12-23 DIAGNOSIS — Z113 Encounter for screening for infections with a predominantly sexual mode of transmission: Secondary | ICD-10-CM | POA: Insufficient documentation

## 2020-12-23 DIAGNOSIS — M5410 Radiculopathy, site unspecified: Secondary | ICD-10-CM

## 2020-12-23 DIAGNOSIS — K219 Gastro-esophageal reflux disease without esophagitis: Secondary | ICD-10-CM

## 2020-12-23 DIAGNOSIS — F322 Major depressive disorder, single episode, severe without psychotic features: Secondary | ICD-10-CM | POA: Diagnosis not present

## 2020-12-23 DIAGNOSIS — D069 Carcinoma in situ of cervix, unspecified: Secondary | ICD-10-CM | POA: Diagnosis not present

## 2020-12-23 DIAGNOSIS — E569 Vitamin deficiency, unspecified: Secondary | ICD-10-CM

## 2020-12-23 DIAGNOSIS — E782 Mixed hyperlipidemia: Secondary | ICD-10-CM | POA: Diagnosis not present

## 2020-12-23 DIAGNOSIS — Z124 Encounter for screening for malignant neoplasm of cervix: Secondary | ICD-10-CM | POA: Insufficient documentation

## 2020-12-23 DIAGNOSIS — J302 Other seasonal allergic rhinitis: Secondary | ICD-10-CM | POA: Diagnosis not present

## 2020-12-23 MED ORDER — ALBUTEROL SULFATE HFA 108 (90 BASE) MCG/ACT IN AERS: INHALATION_SPRAY | RESPIRATORY_TRACT | 1 refills | Status: DC

## 2020-12-23 MED ORDER — VARENICLINE TARTRATE 1 MG PO TABS: ORAL_TABLET | ORAL | 0 refills | Status: AC

## 2020-12-23 MED ORDER — MIRTAZAPINE 15 MG PO TABS: 15.0000 mg | ORAL_TABLET | Freq: Every day | ORAL | 2 refills | Status: DC

## 2020-12-23 MED ORDER — DULOXETINE HCL 60 MG PO CPEP: 60.0000 mg | ORAL_CAPSULE | Freq: Every day | ORAL | 5 refills | Status: DC

## 2020-12-23 MED ORDER — FLUTICASONE PROPIONATE 50 MCG/ACT NA SUSP: NASAL | 0 refills | Status: AC

## 2020-12-23 MED ORDER — SPIRIVA RESPIMAT 2.5 MCG/ACT IN AERS: 2.0000 | INHALATION_SPRAY | Freq: Every day | RESPIRATORY_TRACT | Status: DC

## 2020-12-23 MED ORDER — ESOMEPRAZOLE MAGNESIUM 20 MG PO CPDR: 20.0000 mg | DELAYED_RELEASE_CAPSULE | Freq: Every day | ORAL | 1 refills | Status: DC

## 2020-12-23 NOTE — Progress Notes (Signed)
   CC: depression   HPI:  [name] is a 45 y.o. with PMH as below.   Please see A&P for assessment of the patient's acute and chronic medical conditions.    Past Medical History:  Diagnosis Date  . Asthma   . Carpal tunnel syndrome   . CIN III (cervical intraepithelial neoplasia III) 2017   Seen on colposcopy. 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 COPD (chronic obstructive pulmonary disease) (HCC)   . Hemorrhoids   . Iron deficiency anemia   . Sjogren's syndrome (HCC)    Review of Systems:   Review of Systems  Constitutional: Negative for chills, fever, malaise/fatigue and weight loss.  Respiratory: Negative for cough, shortness of breath and wheezing.   Cardiovascular: Negative for chest pain, palpitations and leg swelling.  Gastrointestinal: Negative for abdominal pain, constipation, diarrhea and nausea.  Genitourinary: Negative for dysuria, frequency and urgency.  Neurological: Negative for dizziness and weakness.  Psychiatric/Behavioral: Negative for depression and suicidal ideas. The patient is not nervous/anxious and does not have insomnia.    Physical Exam:   Constitution: NAD, appears older than stated age HENT: [place]/AT Cardio: RRR, no m/r/g, no LE edema  Respiratory: CTA, no w/r/r MSK: moving all extremities Neuro: normal affect, a&ox3 GU: cervix without erythema or lesions, clear discharge, no tenderness with bimanual exam, unable to palpate ovaries  Skin: c/d/i    Vitals:   12/23/20 1041  BP: 123/80  Pulse: 94  SpO2: 99%  Weight: 169 lb 1.6 oz (76.7 kg)    Assessment & Plan:   See Encounters Tab for problem based charting.  Patient discussed with Dr. Heide Spark

## 2020-12-23 NOTE — Assessment & Plan Note (Signed)
She continues to smoke but would like to quit. She quit in the past but restarted when her daughter went into the hospital. In the past chantix has helped her.   - start chantix and titrate to 1mg  bid, can decrease if needed. Discussed adverse effects  - check bmp

## 2020-12-23 NOTE — Assessment & Plan Note (Addendum)
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 She was supposed to have a f/u pap within one year but has not yet completed this. No symptoms of vaginal or abdominal pain or abnormal discharge. She is sexually active and interested in STD testing.   - Pap smear  - Std testing today

## 2020-12-23 NOTE — Patient Instructions (Signed)
Thank you for allowing [place] to provide your care today. Today we discussed your depression and prior abnormal pap smear  I have ordered the following labs for you:  BMP, HIV, and Lipid panle    I will call if any are abnormal.    Today we made the following changes to your medications:   Please START taking  Chantix - take a half tablet for 3 days THEN take a half tablet 2 times per day for 3 days THEN take one tablet 2 times per day   Please request a refill once you run out.   Please follow-up in six months or sooner if needed.    Please call the internal medicine center clinic if you have any questions or concerns, we may be able to help and keep you from a long and expensive emergency room wait. Our clinic and after hours phone number is [phone], the best time to call is Monday through Friday 9 am to 4 pm but there is always someone available 24/7 if you have an emergency. If you need medication refills please notify your pharmacy one week in advance and they will send [place] a request.

## 2020-12-23 NOTE — Addendum Note (Signed)
Addended by: Guinevere Scarlet A on: 12/23/2020 11:33 AM   Modules accepted: Orders

## 2020-12-23 NOTE — Assessment & Plan Note (Signed)
She has been feeling much better since starting the mirtazapine and she has not been feeling depressed. She has been spending a lot of time with her daughter. PHQ-9 1 today.   - cont. Mirtazapine and cymbalta - f/u in six months

## 2020-12-23 NOTE — Assessment & Plan Note (Signed)
Lipid panel in October showed LDL 179, cholesterol 280, and HDL of 35. ASCVD risk 9.3%. Previous plan was lifestyle intervention and she has been working hard to improve her diet and increase walking.   - recheck lipid panel today

## 2020-12-24 LAB — BMP8+ANION GAP
Anion Gap: 16 mmol/L (ref 10.0–18.0)
BUN/Creatinine Ratio: 27 — ABNORMAL HIGH (ref 9–23)
BUN: 14 mg/dL (ref 6–24)
CO2: 22 mmol/L (ref 20–29)
Calcium: 9.4 mg/dL (ref 8.7–10.2)
Chloride: 101 mmol/L (ref 96–106)
Creatinine, Ser: 0.52 mg/dL — ABNORMAL LOW (ref 0.57–1.00)
Glucose: 94 mg/dL (ref 65–99)
Potassium: 4.5 mmol/L (ref 3.5–5.2)
Sodium: 139 mmol/L (ref 134–144)
eGFR: 118 mL/min/{1.73_m2} (ref >59)

## 2020-12-24 LAB — HIV ANTIBODY (ROUTINE TESTING W REFLEX): HIV Screen 4th Generation wRfx: NONREACTIVE

## 2020-12-24 LAB — LIPID PANEL
Chol/HDL Ratio: 8.6 ratio — ABNORMAL HIGH (ref 0.0–4.4)
Cholesterol, Total: 293 mg/dL — ABNORMAL HIGH (ref 100–199)
HDL: 34 mg/dL — ABNORMAL LOW (ref >39)
LDL Chol Calc (NIH): 178 mg/dL — ABNORMAL HIGH (ref 0–99)
Triglycerides: 409 mg/dL — ABNORMAL HIGH (ref 0–149)
VLDL Cholesterol Cal: 81 mg/dL — ABNORMAL HIGH (ref 5–40)

## 2020-12-24 NOTE — Progress Notes (Signed)
Internal Medicine Clinic Attending  Case discussed with Dr. Seawell  At the time of the visit.  We reviewed the resident's history and exam and pertinent patient test results.  I agree with the assessment, diagnosis, and plan of care documented in the resident's note.  

## 2020-12-25 LAB — CERVICOVAGINAL ANCILLARY ONLY
Bacterial Vaginitis (gardnerella): NEGATIVE
Chlamydia: NEGATIVE
Comment: NEGATIVE
Comment: NEGATIVE
Comment: NEGATIVE
Comment: NORMAL
Neisseria Gonorrhea: NEGATIVE
Trichomonas: NEGATIVE

## 2020-12-25 LAB — CYTOLOGY - PAP
Adequacy: ABSENT
Comment: NEGATIVE
Diagnosis: NEGATIVE
High risk HPV: NEGATIVE

## 2020-12-28 ENCOUNTER — Other Ambulatory Visit: Payer: Self-pay | Admitting: Internal Medicine

## 2020-12-28 DIAGNOSIS — E782 Mixed hyperlipidemia: Secondary | ICD-10-CM

## 2020-12-28 MED ORDER — ATORVASTATIN CALCIUM 40 MG PO TABS: 40.0000 mg | ORAL_TABLET | Freq: Every day | ORAL | 5 refills | Status: DC

## 2020-12-28 NOTE — Assessment & Plan Note (Signed)
Elevated cholesterol, LDL, triglycerides. Will start high intensity statin. Discussed results and plan with her and she is agreeable.

## 2021-01-12 NOTE — Addendum Note (Signed)
Addended by: Neomia Dear on: 01/12/2021 04:11 PM   Modules accepted: Orders

## 2021-02-01 ENCOUNTER — Ambulatory Visit: Payer: Medicaid Other | Admitting: Internal Medicine

## 2021-02-01 ENCOUNTER — Encounter: Payer: Self-pay | Admitting: Internal Medicine

## 2021-02-01 ENCOUNTER — Ambulatory Visit (HOSPITAL_COMMUNITY)
Admission: RE | Admit: 2021-02-01 | Discharge: 2021-02-01 | Disposition: A | Discharge status: Home / Self Care | Payer: Medicaid Other | Source: Ambulatory Visit | Attending: Internal Medicine | Admitting: Internal Medicine

## 2021-02-01 ENCOUNTER — Other Ambulatory Visit: Payer: Self-pay

## 2021-02-01 VITALS — BP 131/82 | HR 95 | Temp 98.6°F | Ht 61.0 in | Wt 166.9 lb

## 2021-02-01 DIAGNOSIS — H9193 Unspecified hearing loss, bilateral: Secondary | ICD-10-CM | POA: Diagnosis not present

## 2021-02-01 DIAGNOSIS — M5417 Radiculopathy, lumbosacral region: Secondary | ICD-10-CM | POA: Insufficient documentation

## 2021-02-01 DIAGNOSIS — M545 Low back pain, unspecified: Secondary | ICD-10-CM | POA: Diagnosis not present

## 2021-02-01 MED ORDER — CYCLOBENZAPRINE HCL 5 MG PO TABS: 5.0000 mg | ORAL_TABLET | Freq: Every evening | ORAL | 0 refills | Status: DC | PRN

## 2021-02-01 MED ORDER — GABAPENTIN 100 MG PO CAPS: 100.0000 mg | ORAL_CAPSULE | Freq: Three times a day (TID) | ORAL | 2 refills | Status: DC

## 2021-02-01 MED ORDER — CYCLOBENZAPRINE HCL 5 MG PO TABS: 5.0000 mg | ORAL_TABLET | Freq: Three times a day (TID) | ORAL | 0 refills | Status: DC | PRN

## 2021-02-01 NOTE — Assessment & Plan Note (Signed)
Patient states that her hearing has been muffled since her last office visit, left greater than right.  She denies any tinnitus, ear pain or discharge.  Ear exam was unremarkable.  Tympanic membranes normal and no significant wax appreciated.  Discussed over-the-counter earwax removal kits.  If this does not help patient may need to be referred to an audiologist.

## 2021-02-01 NOTE — Progress Notes (Signed)
   CC: Back pain  HPI:  [name] is a 45 y.o. with a past medical history listed below presenting for lower back pain. For details of today's visit and the status of his chronic medical issues please refer to the assessment and plan.   Past Medical History:  Diagnosis Date  . Asthma   . Carpal tunnel syndrome   . CIN III (cervical intraepithelial neoplasia III) 2017   Seen on colposcopy. 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 COPD (chronic obstructive pulmonary disease) (HCC)   . Hemorrhoids   . Iron deficiency anemia   . Sjogren's syndrome (HCC)    Review of Systems:   Review of Systems  Constitutional: Positive for malaise/fatigue. Negative for chills and fever.  Cardiovascular: Negative for leg swelling.  Gastrointestinal: Negative for abdominal pain, constipation, diarrhea, nausea and vomiting.  Genitourinary: Negative for frequency and urgency.  Musculoskeletal: Positive for back pain, joint pain and myalgias.  Neurological: Positive for tingling, sensory change and weakness.     Physical Exam:  Vitals:   02/01/21 0948 02/01/21 0950  BP:  131/82  Pulse:  95  Temp:  98.6 F (37 C)  TempSrc:  Oral  SpO2:  99%  Weight: 166 lb 14.4 oz (75.7 kg)   Height: 5\' 1"  (1.549 m)    Physical Exam Vitals reviewed.  Constitutional:      General: She is not in acute distress.    Appearance: Normal appearance. She is not ill-appearing.  Cardiovascular:     Rate and Rhythm: Normal rate and regular rhythm.     Pulses: Normal pulses.     Heart sounds: Normal heart sounds. No murmur heard. No friction rub. No gallop.   Pulmonary:     Effort: Pulmonary effort is normal. No respiratory distress.     Breath sounds: Normal breath sounds. No wheezing.  Abdominal:     General: Abdomen is flat. Bowel sounds are normal. There is no distension.     Palpations: Abdomen is soft.     Tenderness: There is no abdominal tenderness.  Musculoskeletal:        General:  Tenderness present. No swelling.     Right lower leg: No edema.     Left lower leg: No edema.     Comments: Limited range of motion of back flexion and extension, severely tender to palpation of thoracic and lumbar spine spine as well as paraspinal muscles, right lower extremity strength 4 out of 5 left lower extremity strength 5 out of 5, sensation intact, patellar and Achilles reflexes intact, big toe dorsiflexion normal  Skin:    General: Skin is warm and dry.  Neurological:     Mental Status: She is alert and oriented to person, place, and time.     Motor: Weakness present.     Gait: Gait abnormal.     Deep Tendon Reflexes: Reflexes normal.  Psychiatric:        Mood and Affect: Mood normal.        Behavior: Behavior normal.        Thought Content: Thought content normal.        Judgment: Judgment normal.     Assessment & Plan:   See Encounters Tab for problem based charting.  Patient discussed with Dr. .

## 2021-02-01 NOTE — Patient Instructions (Signed)
Thank you for allowing [place] to provide your care today. Today we discussed your back pain.    I have ordered a lumbar spine xray for you. I will call you with the results.   Today we made some changes to your medications. Start taking gabapentin 100 mg up to three times daily. If you tolerate this well you can increase to 2-3 tablets at night time. I have also sent in a prescription for a muscle relaxer, flexeril 5 mg at bedtime.   Please follow-up in 3 months.    Should you have any questions or concerns please call the internal medicine clinic at [phone].
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
## 2021-02-03 ENCOUNTER — Encounter: Payer: Self-pay | Admitting: Physical Therapy

## 2021-02-03 ENCOUNTER — Other Ambulatory Visit: Payer: Self-pay

## 2021-02-03 ENCOUNTER — Ambulatory Visit: Payer: Medicaid Other | Attending: Internal Medicine | Admitting: Physical Therapy

## 2021-02-03 DIAGNOSIS — M6281 Muscle weakness (generalized): Secondary | ICD-10-CM

## 2021-02-03 DIAGNOSIS — M5442 Lumbago with sciatica, left side: Secondary | ICD-10-CM | POA: Insufficient documentation

## 2021-02-03 DIAGNOSIS — R262 Difficulty in walking, not elsewhere classified: Secondary | ICD-10-CM | POA: Diagnosis present

## 2021-02-03 DIAGNOSIS — G8929 Other chronic pain: Secondary | ICD-10-CM

## 2021-02-03 DIAGNOSIS — M5441 Lumbago with sciatica, right side: Secondary | ICD-10-CM | POA: Insufficient documentation

## 2021-02-03 DIAGNOSIS — R252 Cramp and spasm: Secondary | ICD-10-CM | POA: Diagnosis present
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
## 2021-02-03 NOTE — Patient Instructions (Signed)
Access Code: [email or web address] URL: [email or web address] Date: 02/03/2021 Prepared by: Lysle Rubens  Exercises Supine Lower Trunk Rotation - 1 x daily - 7 x weekly - 3 sets - 10 reps - 5 sec hold Supine Posterior Pelvic Tilt - 1 x daily - 7 x weekly - 3 sets - 10 reps - 3 sec hold Seated Scapular Retraction - 1 x daily - 7 x weekly - 3 sets - 10 reps - 3 sec hold Seated Hamstring Stretch - 1 x daily - 7 x weekly - 3 sets - 2 reps - 15-20 sec hold

## 2021-02-03 NOTE — Progress Notes (Signed)
Internal Medicine Clinic Attending  Case discussed with Dr. Karilyn Cota  At the time of the visit.  We reviewed the resident's history and exam and pertinent patient test results.  I agree with the assessment, diagnosis, and plan of care documented in the resident's note.  Chronicity  with poor response to conservative care together with asymmetric strength LE warrant imaging. Plain film first followed by MRI if needed.

## 2021-02-04 NOTE — Addendum Note (Signed)
Addended by: Jaci Standard on: 02/04/2021 09:26 AM   Modules accepted: Orders

## 2021-02-06 DIAGNOSIS — Z20822 Contact with and (suspected) exposure to covid-19: Secondary | ICD-10-CM | POA: Diagnosis not present

## 2021-02-11 ENCOUNTER — Other Ambulatory Visit: Payer: Self-pay | Admitting: Internal Medicine

## 2021-02-11 ENCOUNTER — Ambulatory Visit: Payer: Medicaid Other | Admitting: Physical Therapy

## 2021-02-15 ENCOUNTER — Telehealth: Payer: Self-pay

## 2021-02-15 NOTE — Telephone Encounter (Signed)
Pls contact pt regarding leg pain [phone]

## 2021-02-15 NOTE — Telephone Encounter (Signed)
Return pt's call -stated on Friday after playing with her granddaughter, she started having pain from lower back down her left leg to her foot. And sometimes her foot gets a numb feeling. Advised pt to schedule an appt to be evaluated; she's agreeable. Call transferred to front office to schedule an appt.- appt schedule with Dr Gwyneth Revels on Thurs 5/5.

## 2021-02-16 ENCOUNTER — Ambulatory Visit: Payer: Medicaid Other | Attending: Internal Medicine | Admitting: Physical Therapy

## 2021-02-16 ENCOUNTER — Other Ambulatory Visit: Payer: Self-pay

## 2021-02-16 DIAGNOSIS — G8929 Other chronic pain: Secondary | ICD-10-CM | POA: Diagnosis present

## 2021-02-16 DIAGNOSIS — R262 Difficulty in walking, not elsewhere classified: Secondary | ICD-10-CM

## 2021-02-16 DIAGNOSIS — M5441 Lumbago with sciatica, right side: Secondary | ICD-10-CM | POA: Insufficient documentation

## 2021-02-16 DIAGNOSIS — M5442 Lumbago with sciatica, left side: Secondary | ICD-10-CM | POA: Insufficient documentation

## 2021-02-16 DIAGNOSIS — M6281 Muscle weakness (generalized): Secondary | ICD-10-CM | POA: Diagnosis present

## 2021-02-16 DIAGNOSIS — R252 Cramp and spasm: Secondary | ICD-10-CM | POA: Diagnosis present

## 2021-02-16 NOTE — Therapy (Signed)
[name] [address] [place], [place], [address] Phone: [phone]   Fax:  [phone]  Physical Therapy Treatment  Patient Details  Name: [name] MRN: [id number] Date of Birth: [birth year] Referring Provider (PT): Rubye Oaks Date: 02/16/2021   PT End of Session - 02/16/21 1151    Visit Number 2    Date for PT Re-Evaluation 04/28/21    PT Start Time 1145    PT Stop Time 1225    PT Time Calculation (min) 40 min    Activity Tolerance Patient tolerated treatment well    Behavior During Therapy [facility] for tasks assessed/performed           Past Medical History:  Diagnosis Date  . Asthma   . Carpal tunnel syndrome   . CIN III (cervical intraepithelial neoplasia III) 2017   Seen on colposcopy. 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 COPD (chronic obstructive pulmonary disease) (HCC)   . Hemorrhoids   . Iron deficiency anemia   . Sjogren's syndrome [facility])     Past Surgical History:  Procedure Laterality Date  . CESAREAN SECTION    . Tubal ligation      There were no vitals filed for this visit.   Subjective Assessment - 02/16/21 1148    Subjective Pt reports that she is having some shooting pain down her L leg coming from her back and through L hip.    Currently in Pain? Yes    Pain Score 10-Worst pain ever    Pain Location Back    Pain Orientation Lower                             [facility] Adult PT Treatment/Exercise - 02/16/21 0001      Lumbar Exercises: Stretches   Lower Trunk Rotation 60 seconds   feet on pball   Pelvic Tilt 20 reps   on dynadisc     Lumbar Exercises: Aerobic   Nustep L3 x 6 mins      Lumbar Exercises: Seated   Other Seated Lumbar Exercises alternating marching 2x20    Other Seated Lumbar Exercises row 2x10 yellow tband      Lumbar Exercises: Supine   Dead Bug 20 reps   2 sets, alternating just UE   Bridge 10 reps   2 sets   Straight Leg  Raise 10 reps   2 sets ea side   Other Supine Lumbar Exercises DKTC, feet on pball x20      Manual Therapy   Manual Therapy Manual Traction    Manual Traction lumbar hook-lying 5x30secs                    PT Short Term Goals - 02/03/21 1310      PT SHORT TERM GOAL #1   Title Pt will be I with initial HEP    Time 2    Period Weeks    Status New    Target Date 02/17/21             PT Long Term Goals - 02/03/21 1311      PT LONG TERM GOAL #1   Title Pt will be I with advanced HEP    Time 8    Period Weeks    Status New    Target Date 03/31/21      PT LONG TERM GOAL #2  Title Pt will demo lumbar AROM <25% limited with no increase in LBP    Time 8    Period Weeks    Status New    Target Date 03/31/21      PT LONG TERM GOAL #3   Title Pt will demo 5x STS without UEs <15 sec with no increase in LBP    Time 8    Period Weeks    Status New    Target Date 03/31/21      PT LONG TERM GOAL #4   Title Pt will report 50% reduction in LBP    Time 8    Period Weeks    Status New    Target Date 03/31/21      PT LONG TERM GOAL #5   Title Pt will report able to perform all ADLs with </=3/10 LBP    Time 8    Period Weeks    Status New    Target Date 03/31/21                 Plan - 02/16/21 1227    Clinical Impression Statement Pt tolerated first session very well. No c/o of increased pain or radiating symptoms during any supine ther ex. Cues needed to maintain neutral spine during supine ther ex. Pt tolerated manual lumbar traction very well with pt reported decrease in symptoms.  Plan to progress to mechanical traction next session.    PT Treatment/Interventions ADLs/Self Care Home Management;Electrical Stimulation;Iontophoresis 4mg /ml Dexamethasone;Moist Heat;Traction;Neuromuscular re-education;Therapeutic exercise;Therapeutic activities;Functional mobility training;Gait training;Patient/family education;Manual techniques;Dry needling;Passive range of  motion    PT Next Visit Plan Continue to progress TE, lumbar/LE flexibility ex's, manual/modalities as indicated. Mechanical lumbar traction as tolerated.           Patient will benefit from skilled therapeutic intervention in order to improve the following deficits and impairments:  Abnormal gait,Decreased range of motion,Difficulty walking,Increased muscle spasms,Decreased activity tolerance,Pain,Hypomobility,Impaired flexibility,Decreased strength,Postural dysfunction  Visit Diagnosis: Chronic bilateral low back pain with bilateral sciatica  Muscle weakness (generalized)  Difficulty in walking, not elsewhere classified  Cramp and spasm     Problem List Patient Active Problem List   Diagnosis Date Noted  . Lumbosacral radiculopathy 02/01/2021  . Problems with hearing, bilateral 02/01/2021  . Hyperlipidemia 12/23/2020  . Acute right ankle pain 11/25/2020  . Accident due to mechanical fall without injury 09/07/2020  . CIN III (cervical intraepithelial neoplasia III) 08/10/2020  . Fibromyalgia 08/10/2020  . Heartburn 08/10/2020  . Abdominal pain 11/16/2016  . Sjogren's syndrome (HCC) 11/16/2016  . Severe depression (HCC) 11/16/2016  . Chronic arm pain 01/01/2016  . Abnormal menstrual periods 07/13/2015  . Iron deficiency anemia 03/18/2015  . Ovarian cyst 02/20/2015  . Seasonal allergies 01/27/2015  . Tobacco abuse 01/12/2015  . COPD (chronic obstructive pulmonary disease) (HCC) 01/12/2015    01/14/2015, SPTA 02/16/2021, 12:31 PM  [facility] [address] [place], [place], [place] Phone: [phone]   Fax:  [phone]  Name: [name] MRN: [name] Date of Birth: [birth year]

## 2021-02-17 NOTE — Progress Notes (Signed)
   CC: Left leg pain and back pain  HPI:  [name] is a 45 y.o. with a history of lumbosacral radiculopathy, depression, fibromyalgia, and COPD who is presenting for chronic lower back pain and acute on chronic left lower leg pain.   Past Medical History:  Diagnosis Date  . Asthma   . Carpal tunnel syndrome   . CIN III (cervical intraepithelial neoplasia III) 2017   Seen on colposcopy. 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 COPD (chronic obstructive pulmonary disease) (HCC)   . Hemorrhoids   . Iron deficiency anemia   . Sjogren's syndrome (HCC)    Review of Systems:   Constitutional: Negative for chills and fever.  Respiratory: Negative for shortness of breath.   Cardiovascular: Negative for chest pain and leg swelling.  Gastrointestinal: Negative for abdominal pain, nausea and vomiting.  Musculoskeletal: Positive for lower back pain, left leg pain. Neurological: Negative for dizziness and headaches. Positive forleft leg weakness and numbness.   Physical Exam:  Vitals:   02/18/21 1605  BP: (!) 142/85  Pulse: (!) 103  Temp: 98.5 F (36.9 C)  TempSrc: Oral  SpO2: 99%  Weight: 177 lb 4.8 oz (80.4 kg)  Height: 5\' 1"  (1.549 m)   Physical Exam Constitutional:      Appearance: Normal appearance.  HENT:     Head: Normocephalic and atraumatic.     Mouth/Throat:     Mouth: Mucous membranes are moist.     Pharynx: Oropharynx is clear.  Cardiovascular:     Rate and Rhythm: Normal rate and regular rhythm.     Pulses: Normal pulses.     Heart sounds: Normal heart sounds.  Pulmonary:     Effort: Pulmonary effort is normal.     Breath sounds: Normal breath sounds.  Abdominal:     General: Abdomen is flat. Bowel sounds are normal.     Palpations: Abdomen is soft.  Musculoskeletal:        General: Tenderness (TTP over left lower lumbar area, significant tenderness to palpation over left hip joint) present.  Skin:    General: Skin is warm and dry.   Neurological:     Mental Status: She is alert and oriented to person, place, and time.     Sensory: Sensory deficit: Decreased sensation to light touch in LLE compared to right.     Motor: Weakness (4/5 left knee flexion and extension, limited by pain) present.  Psychiatric:        Mood and Affect: Mood normal.        Behavior: Behavior normal.      Assessment & Plan:   See Encounters Tab for problem based charting.  Patient discussed with Dr. 

## 2021-02-18 ENCOUNTER — Encounter: Payer: Self-pay | Admitting: Internal Medicine

## 2021-02-18 ENCOUNTER — Ambulatory Visit: Payer: Medicaid Other | Admitting: Physical Therapy

## 2021-02-18 ENCOUNTER — Other Ambulatory Visit: Payer: Self-pay

## 2021-02-18 ENCOUNTER — Ambulatory Visit: Payer: Medicaid Other | Admitting: Internal Medicine

## 2021-02-18 VITALS — BP 142/85 | HR 103 | Temp 98.5°F | Ht 61.0 in | Wt 177.3 lb

## 2021-02-18 DIAGNOSIS — R252 Cramp and spasm: Secondary | ICD-10-CM

## 2021-02-18 DIAGNOSIS — M5442 Lumbago with sciatica, left side: Secondary | ICD-10-CM | POA: Diagnosis not present

## 2021-02-18 DIAGNOSIS — M6281 Muscle weakness (generalized): Secondary | ICD-10-CM

## 2021-02-18 DIAGNOSIS — R109 Unspecified abdominal pain: Secondary | ICD-10-CM

## 2021-02-18 DIAGNOSIS — M5417 Radiculopathy, lumbosacral region: Secondary | ICD-10-CM

## 2021-02-18 DIAGNOSIS — R1032 Left lower quadrant pain: Secondary | ICD-10-CM | POA: Diagnosis present

## 2021-02-18 DIAGNOSIS — R262 Difficulty in walking, not elsewhere classified: Secondary | ICD-10-CM

## 2021-02-18 DIAGNOSIS — G8929 Other chronic pain: Secondary | ICD-10-CM

## 2021-02-18 LAB — POCT URINALYSIS DIPSTICK
Bilirubin, UA: NEGATIVE
Glucose, UA: NEGATIVE
Ketones, UA: NEGATIVE
Leukocytes, UA: NEGATIVE
Nitrite, UA: NEGATIVE
Protein, UA: NEGATIVE
Spec Grav, UA: 1.025 (ref 1.010–1.025)
Urobilinogen, UA: 1 E.U./dL
pH, UA: 7 (ref 5.0–8.0)

## 2021-02-18 MED ORDER — MELOXICAM 7.5 MG PO TABS: 7.5000 mg | ORAL_TABLET | Freq: Every day | ORAL | 0 refills | Status: DC

## 2021-02-18 NOTE — Therapy (Signed)
[facility] [address] [place], [place], [address] Phone: [phone]   Fax:  [phone]  Physical Therapy Treatment  Patient Details  Name: [name] MRN: [id number] Date of Birth: Jun 08, 1976 Referring Provider (PT): Rubye Oaks Date: 02/18/2021   PT End of Session - 02/18/21 1147    Visit Number 3    Date for PT Re-Evaluation 04/28/21    PT Start Time 1145    PT Stop Time 1231    PT Time Calculation (min) 46 min    Activity Tolerance Patient tolerated treatment well    Behavior During Therapy [facility] for tasks assessed/performed           Past Medical History:  Diagnosis Date  . Asthma   . Carpal tunnel syndrome   . CIN III (cervical intraepithelial neoplasia III) 2017   Seen on colposcopy. 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 COPD (chronic obstructive pulmonary disease) (HCC)   . Hemorrhoids   . Iron deficiency anemia   . Sjogren's syndrome [facility])     Past Surgical History:  Procedure Laterality Date  . CESAREAN SECTION    . Tubal ligation      There were no vitals filed for this visit.   Subjective Assessment - 02/18/21 1147    Subjective Pt reports her LB is still hutring. She reports that she is seeing doctor today for pain in her L leg.    Currently in Pain? Yes    Pain Score 10-Worst pain ever    Pain Location Back    Pain Orientation Lower;Left                             [facility] Adult PT Treatment/Exercise - 02/18/21 0001      Lumbar Exercises: Stretches   Other Lumbar Stretch Exercise pball roll outs FF x 20 with 3 sec hold      Lumbar Exercises: Aerobic   Nustep L4 x      Lumbar Exercises: Seated   Sit to Stand 10 reps   without UE, elevated mat table, cues to keep chest elevated and feet even   Other Seated Lumbar Exercises alternating marching x10, x 10 on dyna disc, x 10 w/ opp arms, ab set x 10 w/ 3sec hold with pball    Other Seated  Lumbar Exercises row, ext  2x10 red tband      Lumbar Exercises: Supine   Bridge 10 reps   feet on pball   Other Supine Lumbar Exercises DKTC with feet on pball x20, TR with feet on pball x20      Modalities   Modalities Traction      Traction   Type of Traction Lumbar    Min (lbs) 50    Max (lbs) 50    Hold Time 15 mins    Time 15 mins                    PT Short Term Goals - 02/03/21 1310      PT SHORT TERM GOAL #1   Title Pt will be I with initial HEP    Time 2    Period Weeks    Status New    Target Date 02/17/21             PT Long Term Goals - 02/18/21 1149      PT LONG  TERM GOAL #1   Title Pt will be I with advanced HEP    Status On-going      PT LONG TERM GOAL #4   Title Pt will report 50% reduction in LBP    Status On-going      PT LONG TERM GOAL #5   Title Pt will report able to perform all ADLs with </=3/10 LBP    Baseline Still around 8/10 with certain activities.    Status On-going                 Plan - 02/18/21 1221    Clinical Impression Statement Pt toleraterd progression of session well today. She was able to complete all added ther ex with no c/o of increase in pain or radiatating symptoms. Although her c/o of pain were elevated when she arrived she stated that throughout her session that her LB was feeling better. Pt tolerated mechanical traction excellent with radiating symptoms subsiding. Discussed with pt importance of monitoring radiating symptoms today after traction to determine long term effectiveness.    PT Treatment/Interventions ADLs/Self Care Home Management;Electrical Stimulation;Iontophoresis 4mg /ml Dexamethasone;Moist Heat;Traction;Neuromuscular re-education;Therapeutic exercise;Therapeutic activities;Functional mobility training;Gait training;Patient/family education;Manual techniques;Dry needling;Passive range of motion    PT Next Visit Plan Continue to progress TE, lumbar/LE flexibility ex's, manual/modalities  as indicated. Mechanical lumbar traction as tolerated.           Patient will benefit from skilled therapeutic intervention in order to improve the following deficits and impairments:  Abnormal gait,Decreased range of motion,Difficulty walking,Increased muscle spasms,Decreased activity tolerance,Pain,Hypomobility,Impaired flexibility,Decreased strength,Postural dysfunction  Visit Diagnosis: Chronic bilateral low back pain with bilateral sciatica  Muscle weakness (generalized)  Difficulty in walking, not elsewhere classified  Cramp and spasm     Problem List Patient Active Problem List   Diagnosis Date Noted  . Lumbosacral radiculopathy 02/01/2021  . Problems with hearing, bilateral 02/01/2021  . Hyperlipidemia 12/23/2020  . Acute right ankle pain 11/25/2020  . Accident due to mechanical fall without injury 09/07/2020  . CIN III (cervical intraepithelial neoplasia III) 08/10/2020  . Fibromyalgia 08/10/2020  . Heartburn 08/10/2020  . Abdominal pain 11/16/2016  . Sjogren's syndrome (HCC) 11/16/2016  . Severe depression (HCC) 11/16/2016  . Chronic arm pain 01/01/2016  . Abnormal menstrual periods 07/13/2015  . Iron deficiency anemia 03/18/2015  . Ovarian cyst 02/20/2015  . Seasonal allergies 01/27/2015  . Tobacco abuse 01/12/2015  . COPD (chronic obstructive pulmonary disease) (HCC) 01/12/2015    01/14/2015, SPTA 02/18/2021, 12:39 PM  [facility] [address] [place], [place], [place] Phone: [phone]   Fax:  [phone]  Name: [name] MRN: [name] Date of Birth: 02-Apr-1976

## 2021-02-18 NOTE — Patient Instructions (Signed)
Ms. KARISA NESSER,  It was a pleasure to see you today. Thank you for coming in.   Today we discussed your back and leg pain. I am sorry that you are still having pain. Please continue taking the duloxetine, let me know if you do not have this medication. I have sent in a short course of meloxicam to help with the pain. Please continue working with PT. We will follow up on the MRI.   Please return to clinic in 3 months or sooner if needed.   Thank you again for coming in.   Claudean Severance.D.

## 2021-02-19 ENCOUNTER — Other Ambulatory Visit: Payer: Self-pay

## 2021-02-19 DIAGNOSIS — R109 Unspecified abdominal pain: Secondary | ICD-10-CM | POA: Insufficient documentation

## 2021-02-19 DIAGNOSIS — M5417 Radiculopathy, lumbosacral region: Secondary | ICD-10-CM

## 2021-02-19 NOTE — Assessment & Plan Note (Addendum)
Patient states that on Friday night she went to bed and when she woke up she was on the floor on her left side she started having worsening left-sided leg pain, she is not sure if she fell or how she got on the floor.  She has a sharp pain that radiates from her left lower back down the back of her left leg, it has been occurring daily, and is worse with walking or laying on that side.  She also endorses some numbness and tingling in her left lower extremity, this comes and goes and has also been present for the past week, also endorses some weakness in her leg.  She states that she had a fall 1 year ago and feels like her lower back pain has been worse since that.  She has been getting physical therapy and states that the exercises help a little bit with her back, does not feel that it is helping with her leg pain.  She has tried Tylenol, ibuprofen, Motrin, Goody powders, heating pads, ice packs, she is not sure if this has helped.  She also states she took a pain pill from a friend, she is not sure what it is, states that she was able to sleep when she took it. Had a fall 1 year, feels like it's getting worse.  On exam patient has tenderness to palpation over her left lower lumbar area, significant tenderness to palpation of her left hip joint, she has decreased sensation in her left extremity compared to her right, and held weakness with extension and flexion of her left knee limited by pain.  Of note she had an x-ray on 4/18 that showed spondylosis and facet hypertrophy, had an lumbar MRI time. Findings could still be consistent with lumbosacral radiculopathy that may have worsened with traumatic injury from falling out of bed.  Fracture is less likely given patient's age and no associated risk factors.  We discussed chronic back pain and the need to continue physical therapy.  Also discussed that we can try a short course of meloxicam to see if that helps with the pain.   -Meloxicam 7.5 mg  daily -Continue physical therapy -Continue gabapentin -Continue Cymbalta(patient did not know she was taking this, advised to contact us if she does not have this medication, discussed the role of Cymbalta in people with chronic back pain). -Follow-up lumbar MRI

## 2021-02-19 NOTE — Assessment & Plan Note (Signed)
Patient reporting left lower back pain and some mild dysuria, urinalysis was obtained prior to evaluation.  Patient denied any increased urinary frequency, abdominal pain, fevers, chills, nausea, vomiting.  On exam and further evaluation the lower back pain is likely secondary to her lumbosacral radiculopathy.  Urinalysis showed no evidence of infection.

## 2021-02-19 NOTE — Progress Notes (Signed)
Internal Medicine Clinic Attending ° °Case discussed with Dr. Krienke  At the time of the visit.  We reviewed the resident’s history and exam and pertinent patient test results.  I agree with the assessment, diagnosis, and plan of care documented in the resident’s note.  °

## 2021-02-22 ENCOUNTER — Other Ambulatory Visit: Payer: Self-pay | Admitting: Internal Medicine

## 2021-02-23 ENCOUNTER — Ambulatory Visit: Payer: Medicaid Other | Admitting: Physical Therapy

## 2021-02-23 ENCOUNTER — Other Ambulatory Visit: Payer: Self-pay

## 2021-02-23 DIAGNOSIS — G8929 Other chronic pain: Secondary | ICD-10-CM

## 2021-02-23 DIAGNOSIS — R252 Cramp and spasm: Secondary | ICD-10-CM

## 2021-02-23 DIAGNOSIS — M5442 Lumbago with sciatica, left side: Secondary | ICD-10-CM

## 2021-02-23 DIAGNOSIS — M6281 Muscle weakness (generalized): Secondary | ICD-10-CM

## 2021-02-23 DIAGNOSIS — R262 Difficulty in walking, not elsewhere classified: Secondary | ICD-10-CM

## 2021-02-23 NOTE — Therapy (Signed)
[facility] [address] [place], [place], [address] Phone: [phone]   Fax:  [phone]  Physical Therapy Treatment  Patient Details  Name: [name] MRN: [id number] Date of Birth: [birth year] Referring Provider (PT): Rubye Oaks Date: 02/23/2021   PT End of Session - 02/23/21 1046    Visit Number 4    Date for PT Re-Evaluation 04/28/21    PT Start Time 1014    PT Stop Time 1100    PT Time Calculation (min) 46 min           Past Medical History:  Diagnosis Date  . Asthma   . Carpal tunnel syndrome   . CIN III (cervical intraepithelial neoplasia III) 2017   Seen on colposcopy. 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 COPD (chronic obstructive pulmonary disease) (HCC)   . Hemorrhoids   . Iron deficiency anemia   . Sjogren's syndrome [facility])     Past Surgical History:  Procedure Laterality Date  . CESAREAN SECTION    . Tubal ligation      There were no vitals filed for this visit.   Subjective Assessment - 02/23/21 1016    Subjective some better, doing HEP. tractio nreally helped about 4 hours    Currently in Pain? Yes    Pain Score 7     Pain Location Leg    Pain Orientation Left;Posterior                             [facility] Adult PT Treatment/Exercise - 02/23/21 0001      Exercises   Exercises Knee/Hip      Lumbar Exercises: Aerobic   Nustep L5 x      Lumbar Exercises: Machines for Strengthening   Cybex Lumbar Extension black tband 2 sets 10    Other Lumbar Machine Exercise seated row and lats 15# 2 sets 10      Lumbar Exercises: Standing   Row Strengthening;20 reps   upright on cable pulley   Other Standing Lumbar Exercises AR press 10# cable pulley 10 each    Other Standing Lumbar Exercises incline plank 10 sec hold 5 x      Lumbar Exercises: Seated   Other Seated Lumbar Exercises iso abs 2 sets 10 3 sec hold      Lumbar Exercises: Supine    Clam 15 reps;3 seconds   green band   Bent Knee Raise 20 reps   green tband   Bridge with [employer] Compliant;20 reps   10x with ball squeeze only, 10 x ball + bridge   Straight Leg Raise 10 reps   with abd     Knee/Hip Exercises: Standing   Other Standing Knee Exercises hip ext and abd   yellow tband 10x     Traction   Type of Traction Lumbar    Max (lbs) 55    Time 15 min                    PT Short Term Goals - 02/03/21 1310      PT SHORT TERM GOAL #1   Title Pt will be I with initial HEP    Time 2    Period Weeks    Status New    Target Date 02/17/21             PT Long Term Goals -  02/18/21 1149      PT LONG TERM GOAL #1   Title Pt will be I with advanced HEP    Status On-going      PT LONG TERM GOAL #4   Title Pt will report 50% reduction in LBP    Status On-going      PT LONG TERM GOAL #5   Title Pt will report able to perform all ADLs with </=3/10 LBP    Baseline Still around 8/10 with certain activities.    Status On-going                 Plan - 02/23/21 1046    Clinical Impression Statement progressed ex with cuing to engage core and she tolerated fair but visable fatigue. pt verb 4 plus hours relief with traction so prefromed again and added 5 #    PT Treatment/Interventions ADLs/Self Care Home Management;Electrical Stimulation;Iontophoresis 4mg /ml Dexamethasone;Moist Heat;Traction;Neuromuscular re-education;Therapeutic exercise;Therapeutic activities;Functional mobility training;Gait training;Patient/family education;Manual techniques;Dry needling;Passive range of motion    PT Next Visit Plan assess and progress           Patient will benefit from skilled therapeutic intervention in order to improve the following deficits and impairments:  Abnormal gait,Decreased range of motion,Difficulty walking,Increased muscle spasms,Decreased activity tolerance,Pain,Hypomobility,Impaired flexibility,Decreased strength,Postural  dysfunction  Visit Diagnosis: Chronic bilateral low back pain with bilateral sciatica  Muscle weakness (generalized)  Difficulty in walking, not elsewhere classified  Cramp and spasm     Problem List Patient Active Problem List   Diagnosis Date Noted  . Flank pain 02/19/2021  . Lumbosacral radiculopathy 02/01/2021  . Problems with hearing, bilateral 02/01/2021  . Hyperlipidemia 12/23/2020  . Acute right ankle pain 11/25/2020  . Accident due to mechanical fall without injury 09/07/2020  . CIN III (cervical intraepithelial neoplasia III) 08/10/2020  . Fibromyalgia 08/10/2020  . Heartburn 08/10/2020  . Sjogren's syndrome (HCC) 11/16/2016  . Severe depression (HCC) 11/16/2016  . Chronic arm pain 01/01/2016  . Abnormal menstrual periods 07/13/2015  . Iron deficiency anemia 03/18/2015  . Ovarian cyst 02/20/2015  . Seasonal allergies 01/27/2015  . Tobacco abuse 01/12/2015  . COPD (chronic obstructive pulmonary disease) (HCC) 01/12/2015    [name],[name] 02/23/2021, 10:49 AM  [facility] [address] [place], [place], [place] Phone: [phone]   Fax:  [phone]  Name: [name] MRN: [name] Date of Birth: Nov 13, 1975

## 2021-02-25 ENCOUNTER — Other Ambulatory Visit: Payer: Self-pay

## 2021-02-25 ENCOUNTER — Ambulatory Visit: Payer: Medicaid Other | Admitting: Physical Therapy

## 2021-02-25 DIAGNOSIS — R262 Difficulty in walking, not elsewhere classified: Secondary | ICD-10-CM

## 2021-02-25 DIAGNOSIS — G8929 Other chronic pain: Secondary | ICD-10-CM

## 2021-02-25 DIAGNOSIS — M5442 Lumbago with sciatica, left side: Secondary | ICD-10-CM | POA: Diagnosis not present

## 2021-02-25 DIAGNOSIS — M6281 Muscle weakness (generalized): Secondary | ICD-10-CM

## 2021-02-25 NOTE — Therapy (Signed)
[place] [place], [place], [address] Phone: [phone]   Fax:  [phone]  Physical Therapy Treatment  Patient Details  Name: [name] MRN: [id number] Date of Birth: [birth year] Referring Provider (PT): Lauro Franklin Date: 02/25/2021   PT End of Session - 02/25/21 1051    Visit Number 5    Date for PT Re-Evaluation 04/28/21    PT Start Time 1018    PT Stop Time 1110    PT Time Calculation (min) 52 min           Past Medical History:  Diagnosis Date  . Asthma   . Carpal tunnel syndrome   . CIN III (cervical intraepithelial neoplasia III) 2017   Seen on colposcopy. 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 COPD (chronic obstructive pulmonary disease) ([place])   . Hemorrhoids   . Iron deficiency anemia   . Sjogren's syndrome [facility])     Past Surgical History:  Procedure Laterality Date  . CESAREAN SECTION    . Tubal ligation      There were no vitals filed for this visit.   Subjective Assessment - 02/25/21 1017    Subjective feeling better, no back pain just down posterior left leg. tratcion helps alot    Currently in Pain? Yes    Pain Score 7     Pain Location Leg    Pain Orientation Left;Posterior                             [facility] Adult PT Treatment/Exercise - 02/25/21 0001      Lumbar Exercises: Machines for Strengthening   Cybex Lumbar Extension black tband 2 sets 10    Other Lumbar Machine Exercise seated row and lats 20# 2 sets 10      Lumbar Exercises: Standing   Row Strengthening;15 reps;Theraband    Theraband Level (Row) Level 3 (Green)    Shoulder Extension Both;15 reps;Theraband    Theraband Level (Shoulder Extension) Level 3 (Green)    Other Standing Lumbar Exercises wt ball OH ext 10x, trunk rot 10 x each    Other Standing Lumbar Exercises cable pulley upright row into trunk ext 2 sets 10   15#      Lumbar Exercises: Seated   Sit to  Stand 10 reps   5x wt ball OH, 5x wt ball chest press   Other Seated Lumbar Exercises iso abs 2 sets 10 3 sec hold      Lumbar Exercises: Supine   Bridge with Ball Squeeze Compliant;10 reps;3 seconds    Other Supine Lumbar Exercises feet on ball bridge, DKTC and obl 15 x each    Other Supine Lumbar Exercises tband clam and marching      Knee/Hip Exercises: Standing   Lateral Step Up Both;10 reps;Hand Hold: 2;Step Height: 6"   oppleg abd   Forward Step Up Both;10 reps;Hand Hold: 2;Step Height: 6"   opp leg ext   Other Standing Knee Exercises hip ext and abd   10x BIL red tband     Traction   Type of Traction Lumbar    Max (lbs) 60    Time 15 min                    PT Short Term Goals - 02/25/21 1019      PT SHORT TERM GOAL #1   Title  Pt will be I with initial HEP    Status Achieved             PT Long Term Goals - 02/25/21 1019      PT LONG TERM GOAL #2   Title Pt will demo lumbar AROM <25% limited with no increase in LBP    Baseline LF WFLS, ext decreased 25%, flexion decreased 50%    Status Partially Met      PT LONG TERM GOAL #3   Title Pt will demo 5x STS without UEs <15 sec with no increase in LBP    Baseline 5x 14 sec with pain    Status Partially Met      PT LONG TERM GOAL #4   Title Pt will report 50% reduction in LBP    Baseline 10% reduction    Status Partially Met      PT LONG TERM GOAL #5   Title Pt will report able to perform all ADLs with </=3/10 LBP    Status On-going                 Plan - 02/25/21 1051    Clinical Impression Statement pt is tolerating ex progression well.cuing needed for posture and core actviation. pt getting good relief with traction as well progress core strength. progressing with goals    PT Treatment/Interventions ADLs/Self Care Home Management;Electrical Stimulation;Iontophoresis 27m/ml Dexamethasone;Moist Heat;Traction;Neuromuscular re-education;Therapeutic exercise;Therapeutic activities;Functional  mobility training;Gait training;Patient/family education;Manual techniques;Dry needling;Passive range of motion    PT Next Visit Plan core strength           Patient will benefit from skilled therapeutic intervention in order to improve the following deficits and impairments:  Abnormal gait,Decreased range of motion,Difficulty walking,Increased muscle spasms,Decreased activity tolerance,Pain,Hypomobility,Impaired flexibility,Decreased strength,Postural dysfunction  Visit Diagnosis: Chronic bilateral low back pain with bilateral sciatica  Muscle weakness (generalized)  Difficulty in walking, not elsewhere classified     Problem List Patient Active Problem List   Diagnosis Date Noted  . Flank pain 02/19/2021  . Lumbosacral radiculopathy 02/01/2021  . Problems with hearing, bilateral 02/01/2021  . Hyperlipidemia 12/23/2020  . Acute right ankle pain 11/25/2020  . Accident due to mechanical fall without injury 09/07/2020  . CIN III (cervical intraepithelial neoplasia III) 08/10/2020  . Fibromyalgia 08/10/2020  . Heartburn 08/10/2020  . S[place]'s syndrome ([place] 11/16/2016  . Severe depression ([place] 11/16/2016  . Chronic arm pain 01/01/2016  . Abnormal menstrual periods 07/13/2015  . Iron deficiency anemia 03/18/2015  . Ovarian cyst 02/20/2015  . Seasonal allergies 01/27/2015  . Tobacco abuse 01/12/2015  . COPD (chronic obstructive pulmonary disease) ([place] 01/12/2015    Kinzi Frediani,ANGIE PTA 02/25/2021, 10:53 AM  [place] [address]Phone: [phone]  Fax:  [phone] Name: DROZETTA STUMPPMRN: [id number] of Birth: [birth year]

## 2021-03-01 ENCOUNTER — Other Ambulatory Visit: Payer: Self-pay

## 2021-03-01 DIAGNOSIS — M5417 Radiculopathy, lumbosacral region: Secondary | ICD-10-CM

## 2021-03-01 DIAGNOSIS — J449 Chronic obstructive pulmonary disease, unspecified: Secondary | ICD-10-CM

## 2021-03-01 MED ORDER — ALBUTEROL SULFATE HFA 108 (90 BASE) MCG/ACT IN AERS: INHALATION_SPRAY | RESPIRATORY_TRACT | 1 refills | Status: AC

## 2021-03-01 NOTE — Telephone Encounter (Signed)
Gabapentin #90 with 2 refills sent 4/18.  Atorvastatin #30 with 5 refills sent 3/14.

## 2021-03-01 NOTE — Telephone Encounter (Addendum)
  meloxicam (MOBIC) 7.5 MG tablet   gabapentin (NEURONTIN) 100 MG capsule   cyclobenzaprine (FLEXERIL) 5 MG tablet  atorvastatin (LIPITOR) 40 MG tablet  albuterol (PROAIR HFA) 108 (90 Base) MCG/ACT inhaler, refill request @  [facility] [phone] [place], [place] - [address] [facility] [address] Phone:  [phone]  Fax:  [phone]

## 2021-03-02 ENCOUNTER — Other Ambulatory Visit: Payer: Self-pay

## 2021-03-02 ENCOUNTER — Encounter: Payer: Self-pay | Admitting: Physical Therapy

## 2021-03-02 ENCOUNTER — Ambulatory Visit: Payer: Medicaid Other | Admitting: Physical Therapy

## 2021-03-02 DIAGNOSIS — M5442 Lumbago with sciatica, left side: Secondary | ICD-10-CM | POA: Diagnosis not present

## 2021-03-02 DIAGNOSIS — M6281 Muscle weakness (generalized): Secondary | ICD-10-CM

## 2021-03-02 DIAGNOSIS — R252 Cramp and spasm: Secondary | ICD-10-CM

## 2021-03-02 DIAGNOSIS — R262 Difficulty in walking, not elsewhere classified: Secondary | ICD-10-CM

## 2021-03-02 DIAGNOSIS — G8929 Other chronic pain: Secondary | ICD-10-CM

## 2021-03-02 NOTE — Therapy (Signed)
[place]. [place], [place], [address] Phone: [phone]   Fax:  [phone]  Physical Therapy Treatment  Patient Details  Name: [name] MRN: [id number] Date of Birth: 18-Mar-1976 Referring Provider (PT): Lauro Franklin Date: 03/02/2021   PT End of Session - 03/02/21 1050    Visit Number 6    Date for PT Re-Evaluation 04/28/21    PT Start Time 1019    PT Stop Time 1108    PT Time Calculation (min) 49 min    Activity Tolerance Patient tolerated treatment well    Behavior During Therapy [facility] for tasks assessed/performed           Past Medical History:  Diagnosis Date  . Asthma   . Carpal tunnel syndrome   . CIN III (cervical intraepithelial neoplasia III) 2017   Seen on colposcopy. 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 COPD (chronic obstructive pulmonary disease) ([place])   . Hemorrhoids   . Iron deficiency anemia   . Sjogren's syndrome [facility])     Past Surgical History:  Procedure Laterality Date  . CESAREAN SECTION    . Tubal ligation      There were no vitals filed for this visit.   Subjective Assessment - 03/02/21 1024    Subjective Pt reports increased LBP today d/t very busy weekend at work; states traction gives her a lot of relief    Currently in Pain? Yes    Pain Score 8     Pain Location Back    Pain Orientation Left    Pain Radiating Towards into hip and posterior LLE                             [facility] Adult PT Treatment/Exercise - 03/02/21 0001      Lumbar Exercises: Aerobic   Recumbent Bike L 1.5 x 6 min    Nustep L5 x 4 min      Lumbar Exercises: Machines for Strengthening   Cybex Lumbar Extension black tband 2 sets 10    Other Lumbar Machine Exercise seated row and lats 20# 2 sets 10      Lumbar Exercises: Supine   Bridge with Ball Squeeze 10 reps;3 seconds    Bridge with clamshell 10 reps;3 seconds    Bridge with [employer]  Limitations with green tb    Other Supine Lumbar Exercises feet on ball bridge, DKTC and obl 15 x each    Other Supine Lumbar Exercises green tband clam and marching      Traction   Type of Traction Lumbar    Min (lbs) 50    Max (lbs) 65    Hold Time 60 sec    Rest Time 20 sec    Time 15 min                    PT Short Term Goals - 02/25/21 1019      PT SHORT TERM GOAL #1   Title Pt will be I with initial HEP    Status Achieved             PT Long Term Goals - 02/25/21 1019      PT LONG TERM GOAL #2   Title Pt will demo lumbar AROM <25% limited with no increase in LBP    Baseline LF WFLS, ext decreased 25%, flexion decreased 50%  Status Partially Met      PT LONG TERM GOAL #3   Title Pt will demo 5x STS without UEs <15 sec with no increase in LBP    Baseline 5x 14 sec with pain    Status Partially Met      PT LONG TERM GOAL #4   Title Pt will report 50% reduction in LBP    Baseline 10% reduction    Status Partially Met      PT LONG TERM GOAL #5   Title Pt will report able to perform all ADLs with </=3/10 LBP    Status On-going                 Plan - 03/02/21 1051    Clinical Impression Statement Pt with limited tolerance to standing exercise this rx d/t reports of increased LBP after working a lot this past weekend. Focused on supine core stab/LE strength along with seated machine interventions. Pt tolerated well. Pt reporting good relief with traction so continued, increased to 65 lbs.    PT Treatment/Interventions ADLs/Self Care Home Management;Electrical Stimulation;Iontophoresis 92m/ml Dexamethasone;Moist Heat;Traction;Neuromuscular re-education;Therapeutic exercise;Therapeutic activities;Functional mobility training;Gait training;Patient/family education;Manual techniques;Dry needling;Passive range of motion    PT Next Visit Plan core strength, functional LE strength/flexibility, continue with traction if indicated    Consulted and Agree  with Plan of Care Patient           Patient will benefit from skilled therapeutic intervention in order to improve the following deficits and impairments:  Abnormal gait,Decreased range of motion,Difficulty walking,Increased muscle spasms,Decreased activity tolerance,Pain,Hypomobility,Impaired flexibility,Decreased strength,Postural dysfunction  Visit Diagnosis: Chronic bilateral low back pain with bilateral sciatica  Muscle weakness (generalized)  Difficulty in walking, not elsewhere classified  Cramp and spasm     Problem List Patient Active Problem List   Diagnosis Date Noted  . Flank pain 02/19/2021  . Lumbosacral radiculopathy 02/01/2021  . Problems with hearing, bilateral 02/01/2021  . Hyperlipidemia 12/23/2020  . Acute right ankle pain 11/25/2020  . Accident due to mechanical fall without injury 09/07/2020  . CIN III (cervical intraepithelial neoplasia III) 08/10/2020  . Fibromyalgia 08/10/2020  . Heartburn 08/10/2020  . Sjogren's syndrome ([place] 11/16/2016  . Severe depression ([place] 11/16/2016  . Chronic arm pain 01/01/2016  . Abnormal menstrual periods 07/13/2015  . Iron deficiency anemia 03/18/2015  . Ovarian cyst 02/20/2015  . Seasonal allergies 01/27/2015  . Tobacco abuse 01/12/2015  . COPD (chronic obstructive pulmonary disease) ([place] 01/12/2015   AAmador Cunas PT, DPT ADonald ProseSugg 03/02/2021, 10:53 AM  [place] [address]Phone: [phone]  Fax:  [phone] Name: Christina FLATLEYMRN: [id number] of Birth: 6April 07, 1977

## 2021-03-04 ENCOUNTER — Other Ambulatory Visit: Payer: Self-pay

## 2021-03-04 ENCOUNTER — Encounter: Payer: Self-pay | Admitting: Physical Therapy

## 2021-03-04 ENCOUNTER — Ambulatory Visit: Payer: Medicaid Other | Admitting: Physical Therapy

## 2021-03-04 DIAGNOSIS — M6281 Muscle weakness (generalized): Secondary | ICD-10-CM

## 2021-03-04 DIAGNOSIS — R262 Difficulty in walking, not elsewhere classified: Secondary | ICD-10-CM

## 2021-03-04 DIAGNOSIS — R252 Cramp and spasm: Secondary | ICD-10-CM

## 2021-03-04 DIAGNOSIS — M5442 Lumbago with sciatica, left side: Secondary | ICD-10-CM | POA: Diagnosis not present

## 2021-03-04 DIAGNOSIS — G8929 Other chronic pain: Secondary | ICD-10-CM

## 2021-03-04 NOTE — Therapy (Signed)
[address]. [place], [place], [address] Phone: [phone]   Fax:  [phone]  Physical Therapy Treatment  Patient Details  Name: [name] MRN: [id number] Date of Birth: 1975/12/11 Referring Provider (PT): Lauro Franklin Date: 03/04/2021   PT End of Session - 03/04/21 1056    Visit Number 7    Date for PT Re-Evaluation 04/28/21    PT Start Time 1018    PT Stop Time 1106    PT Time Calculation (min) 48 min    Activity Tolerance Patient tolerated treatment well    Behavior During Therapy [facility] for tasks assessed/performed           Past Medical History:  Diagnosis Date  . Asthma   . Carpal tunnel syndrome   . CIN III (cervical intraepithelial neoplasia III) 2017   Seen on colposcopy. 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 COPD (chronic obstructive pulmonary disease) ([place])   . Hemorrhoids   . Iron deficiency anemia   . Sjogren's syndrome [facility])     Past Surgical History:  Procedure Laterality Date  . CESAREAN SECTION    . Tubal ligation      There were no vitals filed for this visit.   Subjective Assessment - 03/04/21 1023    Subjective Pt reports doing better today with decreased pain    Currently in Pain? Yes    Pain Score 7     Pain Location Back                             [facility] Adult PT Treatment/Exercise - 03/04/21 0001      Lumbar Exercises: Aerobic   Recumbent Bike L2 x 6 min    Nustep L5 x 4 min      Lumbar Exercises: Machines for Strengthening   Cybex Lumbar Extension black tband 2 sets 10    Other Lumbar Machine Exercise seated row and lats 20# 2 sets 10    Other Lumbar Machine Exercise standing shoulder extension 5# 2x10      Lumbar Exercises: Standing   Heel Raises 15 reps    Other Standing Lumbar Exercises resisted gait 30# x4 each direction      Traction   Type of Traction Lumbar    Min (lbs) 50    Max (lbs) 65    Hold Time 60  sec    Rest Time 20 sec    Time 15 min                    PT Short Term Goals - 02/25/21 1019      PT SHORT TERM GOAL #1   Title Pt will be I with initial HEP    Status Achieved             PT Long Term Goals - 02/25/21 1019      PT LONG TERM GOAL #2   Title Pt will demo lumbar AROM <25% limited with no increase in LBP    Baseline LF WFLS, ext decreased 25%, flexion decreased 50%    Status Partially Met      PT LONG TERM GOAL #3   Title Pt will demo 5x STS without UEs <15 sec with no increase in LBP    Baseline 5x 14 sec with pain    Status Partially Met      PT LONG TERM  GOAL #4   Title Pt will report 50% reduction in LBP    Baseline 10% reduction    Status Partially Met      PT LONG TERM GOAL #5   Title Pt will report able to perform all ADLs with </=3/10 LBP    Status On-going                 Plan - 03/04/21 1057    Clinical Impression Statement Pt presents to clinic with LBP improved from last session. Able to tolerate increased number of standing ex's this rx. Added shoulder extension and resisted gait. Cues for posture/form with extensions. Occasional CGA with sidestepping to L resisted gait, otherwise able to self correct. Reports continued relief with lumbar traction.    PT Treatment/Interventions ADLs/Self Care Home Management;Electrical Stimulation;Iontophoresis 55m/ml Dexamethasone;Moist Heat;Traction;Neuromuscular re-education;Therapeutic exercise;Therapeutic activities;Functional mobility training;Gait training;Patient/family education;Manual techniques;Dry needling;Passive range of motion    PT Next Visit Plan core strength, functional LE strength/flexibility, continue with traction if indicated    Consulted and Agree with Plan of Care Patient           Patient will benefit from skilled therapeutic intervention in order to improve the following deficits and impairments:  Abnormal gait,Decreased range of motion,Difficulty  walking,Increased muscle spasms,Decreased activity tolerance,Pain,Hypomobility,Impaired flexibility,Decreased strength,Postural dysfunction  Visit Diagnosis: Chronic bilateral low back pain with bilateral sciatica  Muscle weakness (generalized)  Difficulty in walking, not elsewhere classified  Cramp and spasm     Problem List Patient Active Problem List   Diagnosis Date Noted  . Flank pain 02/19/2021  . Lumbosacral radiculopathy 02/01/2021  . Problems with hearing, bilateral 02/01/2021  . Hyperlipidemia 12/23/2020  . Acute right ankle pain 11/25/2020  . Accident due to mechanical fall without injury 09/07/2020  . CIN III (cervical intraepithelial neoplasia III) 08/10/2020  . Fibromyalgia 08/10/2020  . Heartburn 08/10/2020  . Sjogren's syndrome ([place] 11/16/2016  . Severe depression ([place] 11/16/2016  . Chronic arm pain 01/01/2016  . Abnormal menstrual periods 07/13/2015  . Iron deficiency anemia 03/18/2015  . Ovarian cyst 02/20/2015  . Seasonal allergies 01/27/2015  . Tobacco abuse 01/12/2015  . COPD (chronic obstructive pulmonary disease) ([place] 01/12/2015   AAmador Cunas PT, DPT ADonald ProseSugg 03/04/2021, 10:59 AM  [place] [address]Phone: [phone]  Fax:  [phone] Name: Christina MACEMRN: [id number] of Birth: 603-Dec-1977

## 2021-03-09 ENCOUNTER — Ambulatory Visit: Payer: Medicaid Other | Admitting: Physical Therapy

## 2021-03-09 ENCOUNTER — Other Ambulatory Visit: Payer: Self-pay

## 2021-03-09 ENCOUNTER — Encounter: Payer: Self-pay | Admitting: Physical Therapy

## 2021-03-09 DIAGNOSIS — R262 Difficulty in walking, not elsewhere classified: Secondary | ICD-10-CM

## 2021-03-09 DIAGNOSIS — M5442 Lumbago with sciatica, left side: Secondary | ICD-10-CM

## 2021-03-09 DIAGNOSIS — M6281 Muscle weakness (generalized): Secondary | ICD-10-CM

## 2021-03-09 DIAGNOSIS — G8929 Other chronic pain: Secondary | ICD-10-CM

## 2021-03-09 DIAGNOSIS — R252 Cramp and spasm: Secondary | ICD-10-CM

## 2021-03-09 NOTE — Therapy (Signed)
[address]. [place], [place], [address] Phone: [phone]   Fax:  [phone]  Physical Therapy Treatment  Patient Details  Name: [name] MRN: [id number] Date of Birth: 04/20/1976 Referring Provider (PT): Lauro Franklin Date: 03/09/2021   PT End of Session - 03/09/21 1132    Visit Number 8    Date for PT Re-Evaluation 04/28/21    PT Start Time 1104    PT Stop Time 1150    PT Time Calculation (min) 46 min    Activity Tolerance Patient tolerated treatment well;Patient limited by pain    Behavior During Therapy [facility] for tasks assessed/performed           Past Medical History:  Diagnosis Date  . Asthma   . Carpal tunnel syndrome   . CIN III (cervical intraepithelial neoplasia III) 2017   Seen on colposcopy. 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 COPD (chronic obstructive pulmonary disease) ([place])   . Hemorrhoids   . Iron deficiency anemia   . Sjogren's syndrome [facility])     Past Surgical History:  Procedure Laterality Date  . CESAREAN SECTION    . Tubal ligation      There were no vitals filed for this visit.   Subjective Assessment - 03/09/21 1108    Subjective Pt reports increased L posterior LE pain this morning.    Currently in Pain? Yes    Pain Score 8     Pain Location Back   posterior L LE all the way down to foot   Pain Orientation Left                             [facility] Adult PT Treatment/Exercise - 03/09/21 0001      Lumbar Exercises: Stretches   Active Hamstring Stretch Right;Left;1 rep;20 seconds    Piriformis Stretch Right;Left;2 reps;20 seconds    Piriformis Stretch Limitations seated    Gastroc Stretch Right;Left;1 rep;20 seconds    Other Lumbar Stretch Exercise seated fwd/lat flexion stretch with pball x3 5 sec hold      Lumbar Exercises: Aerobic   Nustep L5 x 6 min      Lumbar Exercises: Machines for Strengthening   Other Lumbar  Machine Exercise seated row and lats 20# 2 sets 10      Lumbar Exercises: Standing   Heel Raises 15 reps      Traction   Type of Traction Lumbar    Min (lbs) 50    Max (lbs) 65    Hold Time 60 sec    Rest Time 20 sec    Time 15 min                    PT Short Term Goals - 02/25/21 1019      PT SHORT TERM GOAL #1   Title Pt will be I with initial HEP    Status Achieved             PT Long Term Goals - 02/25/21 1019      PT LONG TERM GOAL #2   Title Pt will demo lumbar AROM <25% limited with no increase in LBP    Baseline LF WFLS, ext decreased 25%, flexion decreased 50%    Status Partially Met      PT LONG TERM GOAL #3   Title Pt will demo 5x STS without  UEs <15 sec with no increase in LBP    Baseline 5x 14 sec with pain    Status Partially Met      PT LONG TERM GOAL #4   Title Pt will report 50% reduction in LBP    Baseline 10% reduction    Status Partially Met      PT LONG TERM GOAL #5   Title Pt will report able to perform all ADLs with </=3/10 LBP    Status On-going                 Plan - 03/09/21 1133    Clinical Impression Statement Pt demos decreased tolerance to TE this rx d/t increase in LB and LLE pain today. Did a lot of packing/lifting over the weekend and states back is "flared up." Focused first part of session on gentle lumbar/LE stretching, reported relief after. Able to tolerate a few machine interventions. Traction at end, continues to report pain relief with this.    PT Treatment/Interventions ADLs/Self Care Home Management;Electrical Stimulation;Iontophoresis 67m/ml Dexamethasone;Moist Heat;Traction;Neuromuscular re-education;Therapeutic exercise;Therapeutic activities;Functional mobility training;Gait training;Patient/family education;Manual techniques;Dry needling;Passive range of motion    PT Next Visit Plan core strength, functional LE strength/flexibility, continue with traction if indicated    Consulted and Agree with Plan  of Care Patient           Patient will benefit from skilled therapeutic intervention in order to improve the following deficits and impairments:  Abnormal gait,Decreased range of motion,Difficulty walking,Increased muscle spasms,Decreased activity tolerance,Pain,Hypomobility,Impaired flexibility,Decreased strength,Postural dysfunction  Visit Diagnosis: Chronic bilateral low back pain with bilateral sciatica  Muscle weakness (generalized)  Difficulty in walking, not elsewhere classified  Cramp and spasm     Problem List Patient Active Problem List   Diagnosis Date Noted  . Flank pain 02/19/2021  . Lumbosacral radiculopathy 02/01/2021  . Problems with hearing, bilateral 02/01/2021  . Hyperlipidemia 12/23/2020  . Acute right ankle pain 11/25/2020  . Accident due to mechanical fall without injury 09/07/2020  . CIN III (cervical intraepithelial neoplasia III) 08/10/2020  . Fibromyalgia 08/10/2020  . Heartburn 08/10/2020  . Sjogren's syndrome ([place] 11/16/2016  . Severe depression ([place] 11/16/2016  . Chronic arm pain 01/01/2016  . Abnormal menstrual periods 07/13/2015  . Iron deficiency anemia 03/18/2015  . Ovarian cyst 02/20/2015  . Seasonal allergies 01/27/2015  . Tobacco abuse 01/12/2015  . COPD (chronic obstructive pulmonary disease) ([place] 01/12/2015   AAmador Cunas PT, DPT ADonald ProseSugg 03/09/2021, 11:36 AM  [place] [address]Phone: [phone]  Fax:  [phone] Name: DNAOMIE CROWMRN: [id number] of Birth: 6Aug 18, 1977

## 2021-03-10 ENCOUNTER — Other Ambulatory Visit: Payer: Self-pay

## 2021-03-10 DIAGNOSIS — J449 Chronic obstructive pulmonary disease, unspecified: Secondary | ICD-10-CM

## 2021-03-10 NOTE — Telephone Encounter (Signed)
Returned call to patient. Explained she has refills on atorvastatin and gabapentin at [place]. She will call pharmacy to get them ready.  Explained that flexeril and meloxicam were refused as not appropriate. Patient states she still has pain from both hips going down the backs of both legs. States she is in therapy and it is "helping some," but remains in pain.   She is also requesting refill on Spiriva.

## 2021-03-10 NOTE — Telephone Encounter (Signed)
Pt states she is still waiting for the doctor to filled all her meds. Please call pt back.

## 2021-03-11 ENCOUNTER — Ambulatory Visit: Payer: Medicaid Other | Admitting: Physical Therapy

## 2021-03-11 ENCOUNTER — Other Ambulatory Visit: Payer: Self-pay

## 2021-03-11 DIAGNOSIS — M5442 Lumbago with sciatica, left side: Secondary | ICD-10-CM | POA: Diagnosis not present

## 2021-03-11 DIAGNOSIS — M6281 Muscle weakness (generalized): Secondary | ICD-10-CM

## 2021-03-11 DIAGNOSIS — G8929 Other chronic pain: Secondary | ICD-10-CM

## 2021-03-11 NOTE — Therapy (Signed)
[address]. [place], [place], [address] Phone: [phone]   Fax:  [phone]  Physical Therapy Treatment  Patient Details  Name: [name] MRN: [id number] Date of Birth: 27-Dec-1975 Referring Provider (PT): Lauro Franklin Date: 03/11/2021   PT End of Session - 03/11/21 1054    Visit Number 9    Date for PT Re-Evaluation 04/28/21    PT Start Time 3716    PT Stop Time 1100    PT Time Calculation (min) 45 min           Past Medical History:  Diagnosis Date  . Asthma   . Carpal tunnel syndrome   . CIN III (cervical intraepithelial neoplasia III) 2017   Seen on colposcopy. 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 COPD (chronic obstructive pulmonary disease) ([place])   . Hemorrhoids   . Iron deficiency anemia   . Sjogren's syndrome [facility])     Past Surgical History:  Procedure Laterality Date  . CESAREAN SECTION    . Tubal ligation      There were no vitals filed for this visit.   Subjective Assessment - 03/11/21 1013    Subjective back feels great just left posterior leg pain. last session traction lasted all day to decrease leg pain    Currently in Pain? Yes    Pain Score 6     Pain Location Leg    Pain Orientation Left;Posterior                             [facility] Adult PT Treatment/Exercise - 03/11/21 0001      Lumbar Exercises: Aerobic   UBE (Upper Arm Bike) L 1 2 min fwd/2 min backward   standing     Lumbar Exercises: Machines for Strengthening   Cybex Lumbar Extension black tband 2 sets 10    Other Lumbar Machine Exercise seated row and lats 20# 2 sets 10      Lumbar Exercises: Standing   Other Standing Lumbar Exercises wt ball trunk ext and rotation 10 x each    Other Standing Lumbar Exercises REIS with towel for overpressure      Lumbar Exercises: Seated   Sit to Stand 20 reps   with wt ball 10x chest press, 10x OH press     Lumbar Exercises:  Supine   Single Leg Bridge Non-compliant;10 reps    Other Supine Lumbar Exercises feet on ball bridge, DKTC and obl 15 x each      Lumbar Exercises: Prone   Other Prone Lumbar Exercises REIP with/without overpressure   overpressure relieved numbness     Lumbar Exercises: Quadruped   Single Arm Raise 10 reps    Straight Leg Raise 10 reps    Opposite Arm/Leg Raise 10 reps      Knee/Hip Exercises: Standing   Lateral Step Up Both;10 reps;Hand Hold: 2;Step Height: 6"   opp leg abd   Forward Step Up Both;10 reps;Hand Hold: 2;Step Height: 6"   oppleg ext     Traction   Type of Traction Lumbar    Max (lbs) 65   static   Time 15                  PT Education - 03/11/21 1054    Education Details REI supine and standing with over pressure    Person(s) Educated Patient  Methods Explanation;Demonstration;Handout    Comprehension Verbalized understanding;Returned demonstration            PT Short Term Goals - 02/25/21 1019      PT SHORT TERM GOAL #1   Title Pt will be I with initial HEP    Status Achieved             PT Long Term Goals - 03/11/21 1040      PT LONG TERM GOAL #1   Title Pt will be I with advanced HEP    Status Partially Met      PT LONG TERM GOAL #2   Title Pt will demo lumbar AROM <25% limited with no increase in LBP    Baseline WFLs except flexion decreased 20%    Status Partially Met      PT LONG TERM GOAL #3   Title Pt will demo 5x STS without UEs <15 sec with no increase in LBP    Baseline 5x 12 sec no pain    Status Achieved      PT LONG TERM GOAL #4   Title Pt will report 50% reduction in LBP    Status Partially Met      PT LONG TERM GOAL #5   Title Pt will report able to perform all ADLs with </=3/10 LBP    Status Partially Met                 Plan - 03/11/21 1055    Clinical Impression Statement pt tolerated ther ex well today and feels all is helping esp traction, 1 day relief after last session. initiated REIS (  supine and standing) issued as HEP with overpressure as this decreased leg symptoms. proressing with goals.    PT Treatment/Interventions ADLs/Self Care Home Management;Electrical Stimulation;Iontophoresis 18m/ml Dexamethasone;Moist Heat;Traction;Neuromuscular re-education;Therapeutic exercise;Therapeutic activities;Functional mobility training;Gait training;Patient/family education;Manual techniques;Dry needling;Passive range of motion    PT Next Visit Plan progress ext ex           Patient will benefit from skilled therapeutic intervention in order to improve the following deficits and impairments:  Abnormal gait,Decreased range of motion,Difficulty walking,Increased muscle spasms,Decreased activity tolerance,Pain,Hypomobility,Impaired flexibility,Decreased strength,Postural dysfunction  Visit Diagnosis: Muscle weakness (generalized)  Chronic bilateral low back pain with bilateral sciatica     Problem List Patient Active Problem List   Diagnosis Date Noted  . Flank pain 02/19/2021  . Lumbosacral radiculopathy 02/01/2021  . Problems with hearing, bilateral 02/01/2021  . Hyperlipidemia 12/23/2020  . Acute right ankle pain 11/25/2020  . Accident due to mechanical fall without injury 09/07/2020  . CIN III (cervical intraepithelial neoplasia III) 08/10/2020  . Fibromyalgia 08/10/2020  . Heartburn 08/10/2020  . Sjogren's syndrome ([place] 11/16/2016  . Severe depression ([place] 11/16/2016  . Chronic arm pain 01/01/2016  . Abnormal menstrual periods 07/13/2015  . Iron deficiency anemia 03/18/2015  . Ovarian cyst 02/20/2015  . Seasonal allergies 01/27/2015  . Tobacco abuse 01/12/2015  . COPD (chronic obstructive pulmonary disease) ([place] 01/12/2015    Angie Hogg,ANGIE PTA 03/11/2021, 10:57 AM  [place] [address]Phone: [phone]  Fax:  [phone] Name: DBROOK GERACIMRN: [id number] of  Birth: 602-16-1977

## 2021-03-12 ENCOUNTER — Other Ambulatory Visit: Payer: Self-pay | Admitting: Internal Medicine

## 2021-03-12 DIAGNOSIS — M5410 Radiculopathy, site unspecified: Secondary | ICD-10-CM

## 2021-03-12 MED ORDER — SPIRIVA RESPIMAT 2.5 MCG/ACT IN AERS
2.0000 | INHALATION_SPRAY | Freq: Every day | RESPIRATORY_TRACT | Status: DC
Start: 2021-03-12 — End: 2021-03-18

## 2021-03-18 MED ORDER — SPIRIVA RESPIMAT 2.5 MCG/ACT IN AERS: 2.0000 | INHALATION_SPRAY | Freq: Every day | RESPIRATORY_TRACT | 3 refills | Status: DC

## 2021-03-18 NOTE — Addendum Note (Signed)
Addended by: Fredderick Severance on: 03/18/2021 09:13 AM   Modules accepted: Orders

## 2021-03-23 ENCOUNTER — Other Ambulatory Visit: Payer: Self-pay

## 2021-03-23 ENCOUNTER — Ambulatory Visit: Payer: Medicaid Other | Attending: Internal Medicine

## 2021-03-23 DIAGNOSIS — M5441 Lumbago with sciatica, right side: Secondary | ICD-10-CM | POA: Insufficient documentation

## 2021-03-23 DIAGNOSIS — R252 Cramp and spasm: Secondary | ICD-10-CM

## 2021-03-23 DIAGNOSIS — G8929 Other chronic pain: Secondary | ICD-10-CM | POA: Insufficient documentation

## 2021-03-23 DIAGNOSIS — R262 Difficulty in walking, not elsewhere classified: Secondary | ICD-10-CM

## 2021-03-23 DIAGNOSIS — M6281 Muscle weakness (generalized): Secondary | ICD-10-CM | POA: Diagnosis not present

## 2021-03-23 DIAGNOSIS — M5442 Lumbago with sciatica, left side: Secondary | ICD-10-CM | POA: Insufficient documentation

## 2021-03-23 NOTE — Therapy (Signed)
[place]. [place], [place], [address] Phone: [phone]   Fax:  [phone]  Physical Therapy Treatment 10th visit Progress Note Reporting Period 02/03/21  to 03/23/2021  See note below for Objective Data and Assessment of Progress/Goals.       Patient Details  Name: [name] MRN: [id number] Date of Birth: 1976/05/06 Referring Provider (PT): Lauro Franklin Date: 03/23/2021   PT End of Session - 03/23/21 1050    Visit Number 10    Date for PT Re-Evaluation 04/28/21    PT Start Time 0100    PT Stop Time 1130    PT Time Calculation (min) 45 min    Activity Tolerance Patient tolerated treatment well;Patient limited by pain    Behavior During Therapy [facility] for tasks assessed/performed           Past Medical History:  Diagnosis Date  . Asthma   . Carpal tunnel syndrome   . CIN III (cervical intraepithelial neoplasia III) 2017   Seen on colposcopy. 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 COPD (chronic obstructive pulmonary disease) ([place])   . Hemorrhoids   . Iron deficiency anemia   . Sjogren's syndrome [facility])     Past Surgical History:  Procedure Laterality Date  . CESAREAN SECTION    . Tubal ligation      There were no vitals filed for this visit.   Subjective Assessment - 03/23/21 1049    Subjective Was very busy this weekend and back into leg pain on the left was really bad especially saturday night, alot of cramping into leg, almost went to hospital. Felt better next day but back still bothering    Pertinent History fibromyalgia    Patient Stated Goals reduce pain    Currently in Pain? Yes    Pain Score 5     Pain Location Leg    Pain Orientation Left;Posterior              [facility] PT Assessment - 03/23/21 0001      Observation/Other Assessments   Focus on Therapeutic Outcomes (FOTO)  58%                         [facility] Adult PT Treatment/Exercise -  03/23/21 0001      Lumbar Exercises: Aerobic   UBE (Upper Arm Bike) L 1 2 min fwd/2 min backward      Lumbar Exercises: Machines for Strengthening   Cybex Lumbar Extension black tband 2 sets 10    Other Lumbar Machine Exercise seated row and lats 20# 2 sets 10      Lumbar Exercises: Standing   Other Standing Lumbar Exercises wt ball trunk ext and rotation 10 x each    Other Standing Lumbar Exercises REIS with towel for overpressure x 10      Lumbar Exercises: Seated   Sit to Stand 20 reps   with wt ball 10x chest press, 10x OH press     Lumbar Exercises: Supine   Other Supine Lumbar Exercises feet on ball bridge, DKTC and obl 15 x each      Lumbar Exercises: Prone   Other Prone Lumbar Exercises REIP with overpressure x10   overpressure relieved numbness     Lumbar Exercises: Quadruped   Single Arm Raise 10 reps    Single Arm Raises Limitations each side    Straight Leg Raise 10 reps  Straight Leg Raises Limitations each side    Opposite Arm/Leg Raise 10 reps    Opposite Arm/Leg Raise Limitations B alternating      Traction   Type of Traction Lumbar    Min (lbs) 50    Max (lbs) 65    Time 15                    PT Short Term Goals - 02/25/21 1019      PT SHORT TERM GOAL #1   Title Pt will be I with initial HEP    Status Achieved             PT Long Term Goals - 03/23/21 1056      PT LONG TERM GOAL #1   Title Pt will be I with advanced HEP    Status Partially Met      PT LONG TERM GOAL #2   Title Pt will demo lumbar AROM <25% limited with no increase in LBP    Baseline WFLs except flexion decreased 20%    Status Partially Met      PT LONG TERM GOAL #3   Title Pt will demo 5x STS without UEs <15 sec with no increase in LBP    Baseline 5x 12 sec no pain    Status Achieved      PT LONG TERM GOAL #4   Title Pt will report 50% reduction in LBP    Status Partially Met      PT LONG TERM GOAL #5   Title Pt will report able to perform all ADLs  with </=3/10 LBP    Status Partially Met   Will still get up to 7/61 with certain activities.                Plan - 03/23/21 1108    Clinical Impression Statement Pt is making fair progress towards goals. She reports much relief of leg symptoms with repeated extensions with overpressure, traction. She does continue to have some days of pain excarbation as what happened over past weekend and at these times pain does still intensify, although slightly less than before. She has surpassed predicted score on FOTO, increasing to 58% for back function at this time compared to 29% at initial eval. Jalyric is progressing towards PT goals and will benefit from continued PT to work on further improving functional mobility, strength, pain.    PT Treatment/Interventions ADLs/Self Care Home Management;Electrical Stimulation;Iontophoresis 48m/ml Dexamethasone;Moist Heat;Traction;Neuromuscular re-education;Therapeutic exercise;Therapeutic activities;Functional mobility training;Gait training;Patient/family education;Manual techniques;Dry needling;Passive range of motion    PT Next Visit Plan progress ext ex    Consulted and Agree with Plan of Care Patient           Patient will benefit from skilled therapeutic intervention in order to improve the following deficits and impairments:  Abnormal gait,Decreased range of motion,Difficulty walking,Increased muscle spasms,Decreased activity tolerance,Pain,Hypomobility,Impaired flexibility,Decreased strength,Postural dysfunction  Visit Diagnosis: Muscle weakness (generalized)  Chronic bilateral low back pain with bilateral sciatica  Difficulty in walking, not elsewhere classified  Cramp and spasm     Problem List Patient Active Problem List   Diagnosis Date Noted  . Flank pain 02/19/2021  . Lumbosacral radiculopathy 02/01/2021  . Problems with hearing, bilateral 02/01/2021  . Hyperlipidemia 12/23/2020  . Acute right ankle pain 11/25/2020  . Accident  due to mechanical fall without injury 09/07/2020  . CIN III (cervical intraepithelial neoplasia III) 08/10/2020  . Fibromyalgia 08/10/2020  . Heartburn 08/10/2020  . Sjogren's  syndrome ([place]) 11/16/2016  . Severe depression ([place]) 11/16/2016  . Chronic arm pain 01/01/2016  . Abnormal menstrual periods 07/13/2015  . Iron deficiency anemia 03/18/2015  . Ovarian cyst 02/20/2015  . Seasonal allergies 01/27/2015  . Tobacco abuse 01/12/2015  . COPD (chronic obstructive pulmonary disease) ([place]) 01/12/2015    Hall Busing , PT, DPT 03/23/2021, 11:27 AM  [place]. [place], [place], [address] Phone: [phone]   Fax:  [phone]  Name: JONEISHA MILES MRN: [id number] Date of Birth: [birth year]

## 2021-03-25 ENCOUNTER — Ambulatory Visit: Payer: Medicaid Other

## 2021-04-01 ENCOUNTER — Encounter: Payer: Medicaid Other | Admitting: Internal Medicine

## 2021-04-06 ENCOUNTER — Ambulatory Visit: Payer: Medicaid Other

## 2021-04-07 ENCOUNTER — Emergency Department (HOSPITAL_COMMUNITY)
Admission: EM | Admit: 2021-04-07 | Discharge: 2021-04-07 | Disposition: A | Discharge status: Home / Self Care | Payer: Medicaid Other | Attending: Emergency Medicine | Admitting: Emergency Medicine

## 2021-04-07 ENCOUNTER — Other Ambulatory Visit: Payer: Self-pay

## 2021-04-07 ENCOUNTER — Encounter (HOSPITAL_COMMUNITY): Payer: Self-pay | Admitting: Emergency Medicine

## 2021-04-07 DIAGNOSIS — Z7952 Long term (current) use of systemic steroids: Secondary | ICD-10-CM | POA: Diagnosis not present

## 2021-04-07 DIAGNOSIS — S81801A Unspecified open wound, right lower leg, initial encounter: Secondary | ICD-10-CM | POA: Diagnosis present

## 2021-04-07 DIAGNOSIS — F17210 Nicotine dependence, cigarettes, uncomplicated: Secondary | ICD-10-CM | POA: Diagnosis not present

## 2021-04-07 DIAGNOSIS — J449 Chronic obstructive pulmonary disease, unspecified: Secondary | ICD-10-CM | POA: Insufficient documentation

## 2021-04-07 DIAGNOSIS — W540XXA Bitten by dog, initial encounter: Secondary | ICD-10-CM | POA: Insufficient documentation

## 2021-04-07 DIAGNOSIS — S81851A Open bite, right lower leg, initial encounter: Secondary | ICD-10-CM | POA: Diagnosis not present

## 2021-04-07 DIAGNOSIS — Z23 Encounter for immunization: Secondary | ICD-10-CM | POA: Diagnosis not present

## 2021-04-07 DIAGNOSIS — J45909 Unspecified asthma, uncomplicated: Secondary | ICD-10-CM | POA: Diagnosis not present

## 2021-04-07 DIAGNOSIS — T148XXA Other injury of unspecified body region, initial encounter: Secondary | ICD-10-CM

## 2021-04-07 MED ORDER — AMOXICILLIN-POT CLAVULANATE 875-125 MG PO TABS: 1.0000 | ORAL_TABLET | Freq: Two times a day (BID) | ORAL | 0 refills | Status: DC

## 2021-04-07 MED ORDER — AMOXICILLIN-POT CLAVULANATE 875-125 MG PO TABS
1.0000 | ORAL_TABLET | Freq: Once | ORAL | Status: AC
  Administered 2021-04-07: 1 via ORAL
  Filled 2021-04-07: qty 1

## 2021-04-07 MED ORDER — TETANUS-DIPHTH-ACELL PERTUSSIS 5-2.5-18.5 LF-MCG/0.5 IM SUSY
0.5000 mL | PREFILLED_SYRINGE | Freq: Once | INTRAMUSCULAR | Status: AC
  Administered 2021-04-07: 0.5 mL via INTRAMUSCULAR
  Filled 2021-04-07: qty 0.5

## 2021-04-07 NOTE — ED Triage Notes (Signed)
Pt  here with a dog bite to the right lower leg , sent for rabies and t dap

## 2021-04-07 NOTE — ED Provider Notes (Signed)
[facility] EMERGENCY DEPARTMENT Provider Note   CSN: [id number] Arrival date & time: 04/07/21  1553     History No chief complaint on file.   [name] is a 45 y.o. female with no relevant past medical history presents the ED after being bitten by a dog.  She states that it was a small dog who lives at her apartment complex.  She is unfamiliar with the dog, but it was captured by animal control.  She was sent here to the ED for evaluation.  There is 1 small wound on the lateral aspect of her right lower leg.  She is ambulatory.  She cannot tell me if she is up-to-date on tetanus immunization.  She tells me that she will be able to communicate with animal control to ensure that dog is up-to-date on vaccines and negative for rabies.  The bite happened 4 hrs prior to arrival.  She denies blood thinners.  No other injuries.  She cleaned it and applied topical antibiotics.  HPI     Past Medical History:  Diagnosis Date   Asthma    Carpal tunnel syndrome    CIN III (cervical intraepithelial neoplasia III) 2017   Seen on colposcopy. LEEP actually showed CIN II with negative margins   COPD (chronic obstructive pulmonary disease) ([facility])    Hemorrhoids    Iron deficiency anemia    Sjogren's syndrome [facility])     Patient Active Problem List   Diagnosis Date Noted   Flank pain 02/19/2021   Lumbosacral radiculopathy 02/01/2021   Problems with hearing, bilateral 02/01/2021   Hyperlipidemia 12/23/2020   Acute right ankle pain 11/25/2020   Accident due to mechanical fall without injury 09/07/2020   CIN III (cervical intraepithelial neoplasia III) 08/10/2020   Fibromyalgia 08/10/2020   Heartburn 08/10/2020   Sjogren's syndrome ([facility]) 11/16/2016   Severe depression ([facility]) 11/16/2016   Chronic arm pain 01/01/2016   Abnormal menstrual periods 07/13/2015   Iron deficiency anemia 03/18/2015   Ovarian cyst 02/20/2015   Seasonal allergies 01/27/2015   Tobacco abuse  01/12/2015   COPD (chronic obstructive pulmonary disease) ([facility]) 01/12/2015    Past Surgical History:  Procedure Laterality Date   CESAREAN SECTION     Tubal ligation       OB History     Gravida  4   Para  2   Term  1   Preterm  1   AB  2   Living         SAB  2   IAB      Ectopic      Multiple      Live Births              Family History  Problem Relation Age of Onset   Diabetes Mother    Diabetes Father     Social History   Tobacco Use   Smoking status: Every Day    Packs/day: 0.50    Pack years: 0.00    Types: Cigarettes   Smokeless tobacco: Never   Tobacco comments:    5-6  cigs/day on Chantix  Substance Use Topics   Alcohol use: Yes    Alcohol/week: 0.0 standard drinks    Comment: Occasionally.   Drug use: No    Home Medications Prior to Admission medications   Medication Sig Start Date End Date Taking? Authorizing Provider  amoxicillin-clavulanate (AUGMENTIN) 875-125 MG tablet Take 1 tablet by mouth every 12 (twelve) hours. 04/07/21  Yes Lorelee New, PA-C  albuterol (PROAIR HFA) 108 (90 Base) MCG/ACT inhaler INHALE 1 TO 2 PUFFS INTO LUNGS EVERY 6 HOURS AS NEEDED FOR WHEEZING OR SHORTNESS OF BREATH 03/01/21   Albertha Ghee, MD  atorvastatin (LIPITOR) 40 MG tablet Take 1 tablet (40 mg total) by mouth daily. 12/28/20   Seawell, Jaimie A, DO  cyclobenzaprine (FLEXERIL) 5 MG tablet Take 1 tablet (5 mg total) by mouth at bedtime as needed for muscle spasms. 02/01/21   Rehman, Areeg N, DO  DULoxetine (CYMBALTA) 60 MG capsule TAKE 1 CAPSULE(60 MG) BY MOUTH DAILY 03/16/21   Remo Lipps, MD  esomeprazole (NEXIUM) 20 MG capsule Take 1 capsule (20 mg total) by mouth daily at 12 noon. 12/23/20   Seawell, Jaimie A, DO  fluticasone (FLONASE) 50 MCG/ACT nasal spray instill 2 sprays into each nostril once daily 12/23/20   Seawell, Jaimie A, DO  gabapentin (NEURONTIN) 100 MG capsule Take 1 capsule (100 mg total) by mouth 3 (three) times daily. 02/01/21  05/02/21  Rehman, Areeg N, DO  meloxicam (MOBIC) 7.5 MG tablet Take 1 tablet (7.5 mg total) by mouth daily. 02/18/21 02/18/22  Claudean Severance, MD  mirtazapine (REMERON) 15 MG tablet Take 1 tablet (15 mg total) by mouth at bedtime. 12/23/20 03/23/21  Seawell, Jaimie A, DO  Multiple Vitamins-Minerals (MULTIVITAMIN WITH MINERALS) tablet Take 1 tablet by mouth daily.    [provider]  Tiotropium Bromide Monohydrate (SPIRIVA RESPIMAT) 2.5 MCG/ACT AERS Inhale 2 puffs into the lungs daily. 03/18/21   Alphonzo Severance, MD  varenicline (CHANTIX) 1 MG tablet FOLLOW INSTRUCTIONS ON SEPARATE PIECE OF PAPER 02/12/21   Rehman, Areeg N, DO  vitamin E 200 UNIT capsule Take 200 Units by mouth daily.    [provider]    Allergies    Patient has no known allergies.  Review of Systems   Review of Systems  All other systems reviewed and are negative.  Physical Exam Updated Vital Signs There were no vitals taken for this visit.  Physical Exam Vitals and nursing note reviewed. Exam conducted with a chaperone present.  Constitutional:      Appearance: Normal appearance.  HENT:     Head: Normocephalic and atraumatic.  Eyes:     General: No scleral icterus.    Conjunctiva/sclera: Conjunctivae normal.  Cardiovascular:     Rate and Rhythm: Normal rate.  Pulmonary:     Effort: Pulmonary effort is normal.  Musculoskeletal:        General: Normal range of motion.  Skin:    General: Skin is dry.     Findings: Lesion present.     Comments: Small wound on lateral aspect right lower leg.  Superficial, non bleeding.  Mildly tender.  No foreign body appreciated.  No deep puncture.  No cellulitic changes.  Neurovascularly intact.  Neurological:     Mental Status: She is alert and oriented to person, place, and time.     GCS: GCS eye subscore is 4. GCS verbal subscore is 5. GCS motor subscore is 6.  Psychiatric:        Mood and Affect: Mood normal.        Behavior: Behavior normal.        Thought  Content: Thought content normal.     ED Results / Procedures / Treatments   Labs (all labs ordered are listed, but only abnormal results are displayed) Labs Reviewed - No data to display  EKG None  Radiology No results found.  Procedures Procedures   Medications Ordered in ED Medications  amoxicillin-clavulanate (AUGMENTIN) 875-125 MG per tablet 1 tablet (has no administration in time range)  Tdap (BOOSTRIX) injection 0.5 mL (has no administration in time range)    ED Course  I have reviewed the triage vital signs and the nursing notes.  Pertinent labs & imaging results that were available during my care of the patient were reviewed by me and considered in my medical decision making (see chart for details).    MDM Rules/Calculators/A&P                          PRAJNA VANDERPOOL was evaluated in Emergency Department on 04/07/2021 for the symptoms described in the history of present illness. She was evaluated in the context of the global COVID-19 pandemic, which necessitated consideration that the patient might be at risk for infection with the SARS-CoV-2 virus that causes COVID-19. Institutional protocols and algorithms that pertain to the evaluation of patients at risk for COVID-19 are in a state of rapid change based on information released by regulatory bodies including the CDC and federal and state organizations. These policies and algorithms were followed during the patient's care in the ED.  I personally reviewed patient's medical chart and all notes from triage and staff during today's encounter. I have also ordered and reviewed all labs and imaging that I felt to be medically necessary in the evaluation of this patient's complaints and with consideration of their physical exam. If needed, translation services were available and utilized.   Patient with small dog bite to right lower leg.  We will treat with Augmentin x7 days and provide first dose here in ED.  Will update  tetanus immunization with Boostrix injection.  She reports she can follow-up with her primary care provider for ongoing evaluation and management.  Will abstain from rabies postexposure prophylaxis at this time given that the dog has been captured by animal control and she can follow-up with them.    Plain films not warranted.  Superficial.  Encouraged her to keep wound clean and irrigated.  Monitor for evidence of infection.  ER return precautions discussed.  Patient voices understanding and is agreeable to the plan.  Final Clinical Impression(s) / ED Diagnoses Final diagnoses:  Animal bite    Rx / DC Orders ED Discharge Orders          Ordered    amoxicillin-clavulanate (AUGMENTIN) 875-125 MG tablet  Every 12 hours        04/07/21 1738             Lorelee New, PA-C 04/07/21 1738    Mancel Bale, MD 04/07/21 2059

## 2021-04-07 NOTE — Discharge Instructions (Addendum)
Please take the Augmentin, as directed.  Follow up with your primary care provider in 3 days for wound check.  Please also follow-up with animal control.  You have NOT been treated prophylactically for rabies given that the dog is in custody.  Please follow up with animal control and your primary care provider for ongoing management.  Return to the ED or seek immediate medical attention should you experience any new or worsening symptoms.

## 2021-04-08 ENCOUNTER — Ambulatory Visit: Payer: Medicaid Other

## 2021-04-08 ENCOUNTER — Telehealth: Payer: Self-pay | Admitting: *Deleted

## 2021-04-08 NOTE — Telephone Encounter (Signed)
Transition Care Management Follow-up Telephone Call Date of discharge and from where: 04/07/2021 - [facility] How have you been since you were released from the hospital? "My leg is a little sore around the dog bite but other than that I am good" Any questions or concerns? No  Items Reviewed: Did the pt receive and understand the discharge instructions provided? Yes  Medications obtained and verified? Yes  Other? No  Any new allergies since your discharge? No  Dietary orders reviewed? No Do you have support at home? No    Functional Questionnaire: (I = Independent and D = Dependent) ADLs: I  Bathing/Dressing- I  Meal Prep- I  Eating- I  Maintaining continence- I  Transferring/Ambulation- I  Managing Meds- I  Follow up appointments reviewed:  PCP Hospital f/u appt confirmed? Yes  Scheduled to see Dr. Karilyn Cota on 04/13/2021 @ 1045. Specialist Hospital f/u appt confirmed? No   Are transportation arrangements needed? No  If their condition worsens, is the pt aware to call PCP or go to the Emergency Dept.? Yes Was the patient provided with contact information for the PCP's office or ED? Yes Was to pt encouraged to call back with questions or concerns? Yes

## 2021-04-13 ENCOUNTER — Ambulatory Visit: Payer: Medicaid Other | Admitting: Internal Medicine

## 2021-04-13 ENCOUNTER — Ambulatory Visit (HOSPITAL_COMMUNITY)
Admission: RE | Admit: 2021-04-13 | Discharge: 2021-04-13 | Disposition: A | Discharge status: Home / Self Care | Payer: Medicaid Other | Source: Ambulatory Visit | Attending: Internal Medicine | Admitting: Internal Medicine

## 2021-04-13 ENCOUNTER — Other Ambulatory Visit: Payer: Self-pay

## 2021-04-13 VITALS — BP 126/83 | HR 100 | Temp 99.2°F | Wt 171.9 lb

## 2021-04-13 DIAGNOSIS — S79921A Unspecified injury of right thigh, initial encounter: Secondary | ICD-10-CM | POA: Diagnosis not present

## 2021-04-13 DIAGNOSIS — M5417 Radiculopathy, lumbosacral region: Secondary | ICD-10-CM | POA: Diagnosis not present

## 2021-04-13 DIAGNOSIS — S81851A Open bite, right lower leg, initial encounter: Secondary | ICD-10-CM | POA: Diagnosis not present

## 2021-04-13 DIAGNOSIS — W540XXD Bitten by dog, subsequent encounter: Secondary | ICD-10-CM | POA: Insufficient documentation

## 2021-04-13 DIAGNOSIS — S81801A Unspecified open wound, right lower leg, initial encounter: Secondary | ICD-10-CM

## 2021-04-13 DIAGNOSIS — W540XXA Bitten by dog, initial encounter: Secondary | ICD-10-CM | POA: Insufficient documentation

## 2021-04-13 LAB — CBC WITH DIFFERENTIAL/PLATELET
Abs Immature Granulocytes: 0.01 10*3/uL (ref 0.00–0.07)
Basophils Absolute: 0.1 10*3/uL (ref 0.0–0.1)
Basophils Relative: 1 %
Eosinophils Absolute: 0.2 10*3/uL (ref 0.0–0.5)
Eosinophils Relative: 4 %
HCT: 42.1 % (ref 36.0–46.0)
Hemoglobin: 13.6 g/dL (ref 12.0–15.0)
Immature Granulocytes: 0 %
Lymphocytes Relative: 31 %
Lymphs Abs: 1.8 10*3/uL (ref 0.7–4.0)
MCH: 29.1 pg (ref 26.0–34.0)
MCHC: 32.3 g/dL (ref 30.0–36.0)
MCV: 90.1 fL (ref 80.0–100.0)
Monocytes Absolute: 0.4 10*3/uL (ref 0.1–1.0)
Monocytes Relative: 7 %
Neutro Abs: 3.4 10*3/uL (ref 1.7–7.7)
Neutrophils Relative %: 57 %
Platelets: 199 10*3/uL (ref 150–400)
RBC: 4.67 MIL/uL (ref 3.87–5.11)
RDW: 14.2 % (ref 11.5–15.5)
WBC: 6 10*3/uL (ref 4.0–10.5)
nRBC: 0 % (ref 0.0–0.2)

## 2021-04-13 LAB — BASIC METABOLIC PANEL
Anion gap: 8 (ref 5–15)
BUN: 15 mg/dL (ref 6–20)
CO2: 23 mmol/L (ref 22–32)
Calcium: 8.8 mg/dL — ABNORMAL LOW (ref 8.9–10.3)
Chloride: 108 mmol/L (ref 98–111)
Creatinine, Ser: 0.57 mg/dL (ref 0.44–1.00)
GFR, Estimated: >60 mL/min (ref >60)
Glucose, Bld: 103 mg/dL — ABNORMAL HIGH (ref 70–99)
Potassium: 3.8 mmol/L (ref 3.5–5.1)
Sodium: 139 mmol/L (ref 135–145)

## 2021-04-13 LAB — C-REACTIVE PROTEIN: CRP: 1.7 mg/dL — ABNORMAL HIGH (ref <1.0)

## 2021-04-13 LAB — LACTIC ACID, PLASMA: Lactic Acid, Venous: 1.3 mmol/L (ref 0.5–1.9)

## 2021-04-13 LAB — SEDIMENTATION RATE: Sed Rate: 22 mm/hr (ref 0–22)

## 2021-04-13 MED ORDER — AMOXICILLIN-POT CLAVULANATE 875-125 MG PO TABS: 1.0000 | ORAL_TABLET | Freq: Two times a day (BID) | ORAL | 0 refills | Status: AC

## 2021-04-13 MED ORDER — DOXYCYCLINE HYCLATE 100 MG PO TABS: 100.0000 mg | ORAL_TABLET | Freq: Two times a day (BID) | ORAL | 0 refills | Status: AC

## 2021-04-13 NOTE — Assessment & Plan Note (Signed)
Patient continues to have significant back pain.  She has not noticed any difference with the meloxicam.  She was doing physical therapy however states that she needs another referral if she wants to continue this.  She states the gabapentin does help her pain and she has been taking 200 mg at times.  She is not sure if the Cymbalta has helped either.  States that she has been taking this medication for a long time.  She had been referred for a lumbar MRI but states no one called to schedule this.  Plan: Follow-up on lumbar MRI scheduling Continue Cymbalta and gabapentin Referral to physical therapy Okay to increase gabapentin to 200 mg 3 times daily

## 2021-04-13 NOTE — Progress Notes (Signed)
   CC: Back pain  HPI:  [name] is a 45 y.o. with a past medical history listed below presenting for follow-up evaluation of her back pain. For details of today's visit and the status of his chronic medical issues please refer to the assessment and plan.   Past Medical History:  Diagnosis Date   Asthma    Carpal tunnel syndrome    CIN III (cervical intraepithelial neoplasia III) 2017   Seen on colposcopy. LEEP actually showed CIN II with negative margins   COPD (chronic obstructive pulmonary disease) ([facility])    Hemorrhoids    Iron deficiency anemia    Sjogren's syndrome ([facility])    Review of Systems:   Review of Systems  Constitutional:  Positive for chills and fever.  Gastrointestinal:  Positive for nausea. Negative for vomiting.  Musculoskeletal:  Positive for myalgias.    Physical Exam:  Vitals:   04/13/21 1052  BP: 126/83  Pulse: 100  Temp: 99.2 F (37.3 C)  TempSrc: Oral  SpO2: 99%  Weight: 171 lb 14.4 oz (78 kg)    Physical Exam General: alert, appears stated age, in no acute distress HEENT: Normocephalic, atraumatic, EOM intact, conjunctiva normal CV: Regular rate and rhythm, no murmurs rubs or gallops Pulm: Clear to auscultation bilaterally, normal work of breathing Abdomen: Soft, nondistended, bowel sounds present, no tenderness to palpation MSK: No lower extremity edema Skin: 1 to 2 cm bite wound mark along anterior right lower leg, extremely tender to palpation and almost necrotic appearing in the center with significant surrounding erythema Neuro: Alert and oriented x3     Assessment & Plan:   See Encounters Tab for problem based charting.  Patient discussed with Dr. Antony Contras

## 2021-04-13 NOTE — Assessment & Plan Note (Addendum)
Patient presents for evaluation of a recent dog bite on 6/22.  She was seen at the emergency department and discharged on Augmentin for 7 days.  Her tetanus immunization was updated with a Boostrix injection.  She was not given rabies postexposure prophylaxis treatment given that the dog was captured by animal control and currently in quarantine.  She states since the dog bite she has been feeling poorly, complains of chills, subjective fevers and nausea.  On exam, she has significant tenderness around and on the wound.  It is scabbing over however there is significant erythema surrounding the bite.    Assessment/plan: There is concern that the dog bite might be infected due to patient's systemic symptoms as well as appearance and significant tenderness to palpation.  Obtaining stat labs and x-ray.  Pending these results patient may require admission for IV antibiotics.  If lab work is reassuring will send home with 5 more days of Augmentin as well as doxycycline 100 mg twice daily for 5 days.  Patient unfortunately left before lab work resulted and we were unable to ultrasound the leg to evaluate for an abscess.  Radiograph and resulted blood work was unremarkable which is reassuring.  We will extend course of oral antibiotics and have the patient return on Friday for reevaluation.  Also discussed the case with a colleague and wound care who states that patient may need debridement of the wound due to necrotic central appearance.  Agree with plan to continue oral antibiotics for now.  -CBC and BMP unremarkable -Lactic acid within normal limits -CRP slightly elevated at 1.7 -Follow-up blood cultures - Right tibia/fibula radiograph unremarkable -Patient to follow-up on Friday for reevaluation and possible ultrasound

## 2021-04-15 NOTE — Progress Notes (Signed)
Internal Medicine Clinic Attending  Case discussed with Dr. Karilyn Cota  At the time of the visit.  We reviewed the resident's history and exam and pertinent patient test results.  I agree with the assessment, diagnosis, and plan of care documented in the resident's note.   Patient with worsening soft tissue infection due to a dog bite despite 6 days of Augmentin therapy. I do not appreciate any fluctuance on exam to suggest an abscess. Due to systemic symptoms, labs and and xray were obtained which all look reassuring. I think this is just a localized infection, worsening due to inadequate antibiotic coverage. We have added on doxycycline for better MRSA coverage and extended her Augmentin for another 5 days. Close follow up in 1 week.

## 2021-04-18 LAB — CULTURE, BLOOD (SINGLE)
Culture: NO GROWTH
Culture: NO GROWTH
Special Requests: ADEQUATE
Special Requests: ADEQUATE

## 2021-04-20 ENCOUNTER — Encounter: Payer: Medicaid Other | Admitting: Student

## 2021-04-20 ENCOUNTER — Encounter: Payer: Self-pay | Admitting: *Deleted

## 2021-04-21 ENCOUNTER — Other Ambulatory Visit: Payer: Self-pay | Admitting: Internal Medicine

## 2021-04-21 DIAGNOSIS — M5417 Radiculopathy, lumbosacral region: Secondary | ICD-10-CM

## 2021-04-21 DIAGNOSIS — E782 Mixed hyperlipidemia: Secondary | ICD-10-CM

## 2021-04-26 ENCOUNTER — Encounter: Payer: Self-pay | Admitting: Student

## 2021-04-26 ENCOUNTER — Other Ambulatory Visit: Payer: Self-pay

## 2021-04-26 ENCOUNTER — Ambulatory Visit: Payer: Medicaid Other | Admitting: Student

## 2021-04-26 VITALS — BP 140/77 | HR 87 | Temp 98.5°F | Ht 61.0 in | Wt 174.6 lb

## 2021-04-26 DIAGNOSIS — R55 Syncope and collapse: Secondary | ICD-10-CM | POA: Diagnosis not present

## 2021-04-26 DIAGNOSIS — W540XXD Bitten by dog, subsequent encounter: Secondary | ICD-10-CM

## 2021-04-26 NOTE — Patient Instructions (Signed)
Ms.Ahlia M Norcia, it was a pleasure seeing you today!  Today we discussed: - Dog bite: I believe your dog bite is improving. If you develop high fevers or see drainage coming from the bite, please return to clinic.  - Loss of consciousness: I have referred you to neurology for this issue. In the mean time  Referrals ordered today:    Referral Orders  Ambulatory referral to Neurology    Follow-up:  if symptoms do not improve    Please make sure to arrive 15 minutes prior to your next appointment. If you arrive late, you may be asked to reschedule.   We look forward to seeing you next time. Please call our clinic at [phone] if you have any questions or concerns. The best time to call is Monday-Friday from 9am-4pm, but there is someone available 24/7. If after hours or the weekend, call the main hospital number and ask for the Internal Medicine Resident On-Call. If you need medication refills, please notify your pharmacy one week in advance and they will send [place] a request.  Thank you for letting us take part in your care. Wishing you the best!  Thank you, Evlyn Kanner, MD

## 2021-04-26 NOTE — Progress Notes (Signed)
   CC: follow-up dog bite, syncopal episodes  HPI:  [name] is a 45 y.o. with medical history as below presenting to [facility] for follow-up for dog bite, syncopal episodes.  Please see problem-based list for further details, assessments, and plans.  Past Medical History:  Diagnosis Date   Asthma    Carpal tunnel syndrome    CIN III (cervical intraepithelial neoplasia III) 2017   Seen on colposcopy. LEEP actually showed CIN II with negative margins   COPD (chronic obstructive pulmonary disease) (HCC)    Hemorrhoids    Iron deficiency anemia    Sjogren's syndrome (HCC)    Review of Systems:  As per HPI  Physical Exam:  Vitals:   04/26/21 1405  BP: 140/77  Pulse: 87  Temp: 98.5 F (36.9 C)  TempSrc: Oral  SpO2: 100%  Weight: 174 lb 9.6 oz (79.2 kg)  Height: 5\' 1"  (1.549 m)   General: Resting comfortably in chair, no acute distress CV: Regular rate, rhythm. No murmurs, rubs, gallops appreciated. Pulm: Normal work of breathing on room air. Clear to auscultation bilaterally Skin: Small eschar on anterior lower right extremity with surrounding erythema. Area is mildly tender and cool to touch. No drainage appreciated. (Pictured below) Neuro: Awake, alert, oriented x4. Cranial nerves in tact. Motor 5/5 throughout. Sensation in tact throughout. Finger-to-nose normal. Psych: Normal mood, affect, speech.     Assessment & Plan:   See Encounters Tab for problem based charting.  Patient discussed with Dr. 

## 2021-04-27 DIAGNOSIS — R55 Syncope and collapse: Secondary | ICD-10-CM | POA: Insufficient documentation

## 2021-04-27 NOTE — Assessment & Plan Note (Signed)
[name] is presenting for follow-up from a dog bite she suffered in June. She was recently seen by our clinic and prescribed Augmentin for soft tissue infection. Today reports she completed the course of Augmentin and feels that the bite has minimally improved since then. Notes that overall she does not feel systemic symptoms that she previously had during her last visit.   Offered reassurance that the area looks to be healing and should continue to do so over time. Return precautions given, including onset of systemic symptoms and new drainage/spreading erythema.

## 2021-04-27 NOTE — Assessment & Plan Note (Signed)
During Ms. Holbein's visit today, she mentions recent syncopal episode that occurred at home a week ago. Describes walking into the house with a plate when she started to feel as if blood was draining from her head. She quickly blacked out, falling onto the ground. Once she woke up, states that she felt ill and tired and had to sleep for a few hours before feeling back to her normal state of health. Upon further investigation, she mentions that she has had recurrent similar episodes in the past, starting back to when she was a child. These occur every month or so. Mentions that she has experienced urinary incontinence and tongue lacerations after these episodes before. She denies any known precipitating events, including sudden movements, turing of head, injury. Notes that she does have several paternal and maternal family members with history of seizures, but she has never been diagnosed.   On physical exam, neurologically appears in tact without any focal deficits. Etiology concerning for possible seizures with life-long history, family history of seizures, urinary incontinence, previous tongue lacerations, and possible post-ictal state. Cardiogenic source unlikely given her pre-syncopal symptoms. Vasovagal cannot be ruled out. At this juncture will refer to neurology for further work-up. I have discussed this with Ms. Beaird who agrees with the plan.  - Neurology referral

## 2021-04-29 ENCOUNTER — Other Ambulatory Visit: Payer: Self-pay | Admitting: *Deleted

## 2021-04-29 DIAGNOSIS — K219 Gastro-esophageal reflux disease without esophagitis: Secondary | ICD-10-CM

## 2021-04-29 MED ORDER — ESOMEPRAZOLE MAGNESIUM 20 MG PO CPDR: 20.0000 mg | DELAYED_RELEASE_CAPSULE | Freq: Every day | ORAL | 1 refills | Status: DC

## 2021-04-29 NOTE — Progress Notes (Signed)
Internal Medicine Clinic Attending ? ?Case discussed with Dr. Braswell  At the time of the visit.  We reviewed the resident?s history and exam and pertinent patient test results.  I agree with the assessment, diagnosis, and plan of care documented in the resident?s note.  ?

## 2021-05-04 ENCOUNTER — Encounter: Payer: Self-pay | Admitting: Neurology

## 2021-05-05 ENCOUNTER — Other Ambulatory Visit: Payer: Self-pay | Admitting: Internal Medicine

## 2021-05-10 ENCOUNTER — Encounter (HOSPITAL_COMMUNITY): Payer: Self-pay

## 2021-05-10 ENCOUNTER — Other Ambulatory Visit: Payer: Self-pay

## 2021-05-10 ENCOUNTER — Emergency Department (HOSPITAL_COMMUNITY)
Admission: EM | Admit: 2021-05-10 | Discharge: 2021-05-11 | Disposition: A | Discharge status: Home / Self Care | Payer: Medicaid Other | Attending: Emergency Medicine | Admitting: Emergency Medicine

## 2021-05-10 DIAGNOSIS — J449 Chronic obstructive pulmonary disease, unspecified: Secondary | ICD-10-CM | POA: Diagnosis not present

## 2021-05-10 DIAGNOSIS — T40601A Poisoning by unspecified narcotics, accidental (unintentional), initial encounter: Secondary | ICD-10-CM

## 2021-05-10 DIAGNOSIS — F17210 Nicotine dependence, cigarettes, uncomplicated: Secondary | ICD-10-CM | POA: Diagnosis not present

## 2021-05-10 DIAGNOSIS — J45909 Unspecified asthma, uncomplicated: Secondary | ICD-10-CM | POA: Diagnosis not present

## 2021-05-10 DIAGNOSIS — T50901A Poisoning by unspecified drugs, medicaments and biological substances, accidental (unintentional), initial encounter: Secondary | ICD-10-CM | POA: Diagnosis not present

## 2021-05-10 DIAGNOSIS — T402X1A Poisoning by other opioids, accidental (unintentional), initial encounter: Secondary | ICD-10-CM | POA: Diagnosis not present

## 2021-05-10 DIAGNOSIS — Z7952 Long term (current) use of systemic steroids: Secondary | ICD-10-CM | POA: Insufficient documentation

## 2021-05-10 LAB — COMPREHENSIVE METABOLIC PANEL
ALT: 56 U/L — ABNORMAL HIGH (ref 0–44)
AST: 33 U/L (ref 15–41)
Albumin: 4.2 g/dL (ref 3.5–5.0)
Alkaline Phosphatase: 103 U/L (ref 38–126)
Anion gap: 10 (ref 5–15)
BUN: 19 mg/dL (ref 6–20)
CO2: 23 mmol/L (ref 22–32)
Calcium: 8.5 mg/dL — ABNORMAL LOW (ref 8.9–10.3)
Chloride: 102 mmol/L (ref 98–111)
Creatinine, Ser: 0.74 mg/dL (ref 0.44–1.00)
GFR, Estimated: >60 mL/min (ref >60)
Glucose, Bld: 224 mg/dL — ABNORMAL HIGH (ref 70–99)
Potassium: 3.5 mmol/L (ref 3.5–5.1)
Sodium: 135 mmol/L (ref 135–145)
Total Bilirubin: 0.3 mg/dL (ref 0.3–1.2)
Total Protein: 7.5 g/dL (ref 6.5–8.1)

## 2021-05-10 LAB — CBC WITH DIFFERENTIAL/PLATELET
Abs Immature Granulocytes: 0.08 10*3/uL — ABNORMAL HIGH (ref 0.00–0.07)
Basophils Absolute: 0.1 10*3/uL (ref 0.0–0.1)
Basophils Relative: 1 %
Eosinophils Absolute: 0.3 10*3/uL (ref 0.0–0.5)
Eosinophils Relative: 2 %
HCT: 45.2 % (ref 36.0–46.0)
Hemoglobin: 14.7 g/dL (ref 12.0–15.0)
Immature Granulocytes: 1 %
Lymphocytes Relative: 9 %
Lymphs Abs: 1.3 10*3/uL (ref 0.7–4.0)
MCH: 28.9 pg (ref 26.0–34.0)
MCHC: 32.5 g/dL (ref 30.0–36.0)
MCV: 88.8 fL (ref 80.0–100.0)
Monocytes Absolute: 0.7 10*3/uL (ref 0.1–1.0)
Monocytes Relative: 5 %
Neutro Abs: 12.3 10*3/uL — ABNORMAL HIGH (ref 1.7–7.7)
Neutrophils Relative %: 82 %
Platelets: 286 10*3/uL (ref 150–400)
RBC: 5.09 MIL/uL (ref 3.87–5.11)
RDW: 14 % (ref 11.5–15.5)
WBC: 14.6 10*3/uL — ABNORMAL HIGH (ref 4.0–10.5)
nRBC: 0 % (ref 0.0–0.2)

## 2021-05-10 LAB — I-STAT BETA HCG BLOOD, ED (MC, WL, AP ONLY): I-stat hCG, quantitative: <5 m[IU]/mL (ref <5)
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
## 2021-05-11 ENCOUNTER — Emergency Department (HOSPITAL_COMMUNITY): Payer: Medicaid Other

## 2021-05-11 DIAGNOSIS — T50901A Poisoning by unspecified drugs, medicaments and biological substances, accidental (unintentional), initial encounter: Secondary | ICD-10-CM | POA: Diagnosis not present
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
## 2021-05-11 NOTE — Discharge Instructions (Addendum)
You have overdosed on an opiate of some kind.  Snorting your medicines is a very high risk behavior.  Snorting other medicines as very dangerous.  Follow-up with your primary care doctor, particularly if you decide you would like some help with the opiate use

## 2021-05-12 ENCOUNTER — Telehealth: Payer: Self-pay

## 2021-05-12 ENCOUNTER — Other Ambulatory Visit: Payer: Self-pay

## 2021-05-12 ENCOUNTER — Ambulatory Visit: Payer: Medicaid Other | Attending: Internal Medicine | Admitting: Physical Therapy

## 2021-05-12 ENCOUNTER — Encounter: Payer: Self-pay | Admitting: Physical Therapy

## 2021-05-12 DIAGNOSIS — M6281 Muscle weakness (generalized): Secondary | ICD-10-CM | POA: Insufficient documentation

## 2021-05-12 DIAGNOSIS — M5441 Lumbago with sciatica, right side: Secondary | ICD-10-CM | POA: Insufficient documentation

## 2021-05-12 DIAGNOSIS — R252 Cramp and spasm: Secondary | ICD-10-CM | POA: Insufficient documentation

## 2021-05-12 DIAGNOSIS — R262 Difficulty in walking, not elsewhere classified: Secondary | ICD-10-CM | POA: Diagnosis not present

## 2021-05-12 DIAGNOSIS — M5442 Lumbago with sciatica, left side: Secondary | ICD-10-CM | POA: Diagnosis not present

## 2021-05-12 DIAGNOSIS — G8929 Other chronic pain: Secondary | ICD-10-CM

## 2021-05-12 NOTE — Telephone Encounter (Signed)
Transition Care Management Follow-up Telephone Call Date of discharge and from where: 05/11/2021-[name]  How have you been since you were released from the hospital? Patient stated she is doing fine.  Any questions or concerns? Yes Patient stated she did not OD however her discharge informations states she did and patient stated it shouldn't say that. Items Reviewed: Did the pt receive and understand the discharge instructions provided? Yes  Medications obtained and verified?  No medications given at discharge  Other? No  Any new allergies since your discharge? No  Dietary orders reviewed? N/A Do you have support at home? Yes   Home Care and Equipment/Supplies: Were home health services ordered? not applicable If so, what is the name of the agency? N/A  Has the agency set up a time to come to the patient's home? not applicable Were any new equipment or medical supplies ordered?  No What is the name of the medical supply agency? N/A Were you able to get the supplies/equipment? not applicable Do you have any questions related to the use of the equipment or supplies? No  Functional Questionnaire: (I = Independent and D = Dependent) ADLs: I  Bathing/Dressing- I  Meal Prep- I  Eating- I  Maintaining continence- I  Transferring/Ambulation- I  Managing Meds- I  Follow up appointments reviewed:  PCP Hospital f/u appt confirmed? No   Specialist Hospital f/u appt confirmed? No   Are transportation arrangements needed? No  If their condition worsens, is the pt aware to call PCP or go to the Emergency Dept.? Yes Was the patient provided with contact information for the PCP's office or ED? Yes Was to pt encouraged to call back with questions or concerns? Yes

## 2021-05-12 NOTE — Therapy (Signed)
[facility] [address] [place], [place], [address] Phone: [phone]   Fax:  [phone]  Physical Therapy Evaluation  Patient Details  Name: [name] MRN: [id number] Date of Birth: 13-Jun-1976 Referring Provider (PT): Guilloud   Encounter Date: 05/12/2021   PT End of Session - 05/12/21 1128     Visit Number 1    Date for PT Re-Evaluation 08/04/21    PT Start Time 1045    PT Stop Time 1120    PT Time Calculation (min) 35 min    Activity Tolerance Patient limited by pain    Behavior During Therapy [facility] for tasks assessed/performed             Past Medical History:  Diagnosis Date   Asthma    Carpal tunnel syndrome    CIN III (cervical intraepithelial neoplasia III) 2017   Seen on colposcopy. LEEP actually showed CIN II with negative margins   COPD (chronic obstructive pulmonary disease) (HCC)    Hemorrhoids    Iron deficiency anemia    Sjogren's syndrome (HCC)     Past Surgical History:  Procedure Laterality Date   CESAREAN SECTION     Tubal ligation      There were no vitals filed for this visit.    Subjective Assessment - 05/12/21 1050     Subjective Pt reports to clinic with c/o increased LBP. Pt also stating that she had an episode with LOC earlier this week and was in hospital. Pt states unsure why she passed out; has appt with neuro 07/20/21. Read hospital notes for more details. Pt states that since she passed out a few times she thinks her LBP is recurring. May have landed on her back in a fall. Pt has been seen by this clinic before for LBP; states this episode of exacerbation is very similar. Pt states B LBP with radiating BLE pain. Pt reports occasional N/T in B feet. Pt reports no changes to B/B, denies saddle anesthesia.    Pertinent History fibromyalgia    Limitations Sitting;Standing;Walking    How long can you stand comfortably? 30 minutes at most    How long can you walk comfortably? 45  min-1hr "if I have to"    Diagnostic tests xrays lumbar spine; MRI ordered but denied prior to completing more PT    Patient Stated Goals reduce pain    Currently in Pain? Yes    Pain Score 8     Pain Location Back    Pain Orientation Right;Left    Pain Descriptors / Indicators Aching    Pain Type Acute pain;Chronic pain    Pain Radiating Towards hip/back and LEs    Pain Onset More than a month ago    Pain Frequency Constant    Aggravating Factors  standing, prolonged sitting, bending over    Pain Relieving Factors tylenol, ibuprofen, heat    Effect of Pain on Daily Activities hurts to walk and to sit                [facility] PT Assessment - 05/12/21 0001       Assessment   Medical Diagnosis LBP    Referring Provider (PT) Guilloud    Onset Date/Surgical Date --   2014   Hand Dominance Right    Next MD Visit --   07/20/2021 with neurologist; will call back and schedule with referring MD as needed   Prior Therapy PT here for LBP  Precautions   Precautions None      Restrictions   Weight Bearing Restrictions No      Balance Screen   Has the patient fallen in the past 6 months Yes    How many times? 5    Has the patient had a decrease in activity level because of a fear of falling?  No    Is the patient reluctant to leave their home because of a fear of falling?  No      Home Environment   Additional Comments 2 stairs to get into apartment; reports trouble with stairs      Prior Function   Level of Independence Independent    Vocation Requirements housework; reports some trouble    Leisure walking      Observation/Other Assessments   Focus on Therapeutic Outcomes (FOTO)  36% risk adjusted      Functional Tests   Functional tests Sit to Stand      Sit to Stand   Comments pushing up from armrests, slow, guarded movements; uses hand to control descent      Posture/Postural Control   Postural Limitations Forward head;Rounded Shoulders      ROM / Strength    AROM / PROM / Strength AROM;Strength      AROM   AROM Assessment Site Lumbar    Lumbar Flexion limited 75% + pain    Lumbar Extension limited 75% + pain    Lumbar - Right Side Bend limited 75% + pain    Lumbar - Left Side Bend limited 75% + pain    Lumbar - Right Rotation limited 75% + pain    Lumbar - Left Rotation limited 75% + pain      Strength   Overall Strength Comments pt unable to maintain sustained effort with MMT    Strength Assessment Site Hip;Knee;Ankle    Right/Left Hip Right;Left    Right Hip Flexion 4/5    Right Hip Extension 4/5    Right Hip ABduction 4-/5    Left Hip Flexion 4+/5    Left Hip Extension 4/5    Left Hip ABduction 4/5    Right/Left Knee Right;Left    Right Knee Flexion 4/5    Right Knee Extension 4/5    Left Knee Flexion 4+/5    Left Knee Extension 4+/5    Right/Left Ankle Right;Left    Right Ankle Dorsiflexion 4+/5    Left Ankle Dorsiflexion 5/5      Flexibility   Soft Tissue Assessment /Muscle Length yes    Hamstrings very tight B R>L    ITB tight    Piriformis very tight B R>L      Palpation   Palpation comment tender to palpation B lumbar paraspinals      Special Tests   Other special tests RLE 50 deg, LLE 50 deg      Transfers   Five time sit to stand comments  unable secondary to pain      Ambulation/Gait   Gait Comments antalgic gait, decreased cadence                        Objective measurements completed on examination: See above findings.               PT Education - 05/12/21 1128     Education Details Pt educated on POC and resumption of HEP    Person(s) Educated Patient    Methods Explanation  Comprehension Verbalized understanding              PT Short Term Goals - 05/12/21 1138       PT SHORT TERM GOAL #1   Title Pt will be I with initial HEP    Time 2    Period Weeks    Status New    Target Date 05/26/21               PT Long Term Goals - 05/12/21 1138        PT LONG TERM GOAL #1   Title Pt will be I with advanced HEP    Time 8    Period Weeks    Status New    Target Date 07/07/21      PT LONG TERM GOAL #2   Title Pt will demo lumbar AROM <25% limited with no increase in LBP    Time 8    Period Weeks    Status New    Target Date 07/07/21      PT LONG TERM GOAL #3   Title Pt will demo 5x STS without UEs <15 sec with no increase in LBP    Time 8    Period Weeks    Status New    Target Date 07/07/21      PT LONG TERM GOAL #4   Title Pt will report 50% reduction in LBP    Time 8    Period Weeks    Status New    Target Date 07/07/21      PT LONG TERM GOAL #5   Title Pt will report able to perform all ADLs with </=3/10 LBP    Time 8    Period Weeks    Status New    Target Date 07/07/21                    Plan - 05/12/21 1128     Clinical Impression Statement Pt presents to clinic with reports of acute exacerbation of chronic LBP. Pt demos B LBP with BLE radiating pain. Denies saddle anesthesia or bowel/bladder changes. Pt reporting several falls recently as a result of syncopal episodes (see ED notes for more details) which she believes may have exacerbated her back pain. Pt demos limitations in lumbar AROM + end range pain, very guarded/stiff movements, inconsistent effort with MMT/difficulty maintaing muscle contraction, and signficant LE flexibility deficits. Pt would benefit from skilled PT to address the above impairments.    Examination-Activity Limitations Bend;Locomotion Level;Stand;Sit    Examination-Participation Restrictions Community Activity;Interpersonal Relationship;Cleaning;Meal Prep;Laundry    Stability/Clinical Decision Making Evolving/Moderate complexity    Clinical Decision Making Low    Rehab Potential Good    PT Frequency 1x / week    PT Duration 8 weeks    PT Treatment/Interventions ADLs/Self Care Home Management;Electrical Stimulation;Iontophoresis 4mg /ml Dexamethasone;Moist  Heat;Traction;Neuromuscular re-education;Therapeutic exercise;Therapeutic activities;Functional mobility training;Gait training;Patient/family education;Manual techniques;Dry needling;Passive range of motion    PT Next Visit Plan gentle progression to TE, lumbar/LE flexibility, core/lumbar stab, manual/modalities as indicated    PT Home Exercise Plan resuming former HEP; issued new TB, pt states she still has printouts    Consulted and Agree with Plan of Care Patient             Patient will benefit from skilled therapeutic intervention in order to improve the following deficits and impairments:  Abnormal gait, Decreased range of motion, Difficulty walking, Increased muscle spasms, Decreased activity tolerance, Pain, Hypomobility, Impaired flexibility,  Decreased strength, Postural dysfunction  Visit Diagnosis: Chronic bilateral low back pain with bilateral sciatica  Muscle weakness (generalized)  Difficulty in walking, not elsewhere classified  Cramp and spasm     Problem List Patient Active Problem List   Diagnosis Date Noted   Recurrent syncope 04/27/2021   Dog bite 04/13/2021   Flank pain 02/19/2021   Lumbosacral radiculopathy 02/01/2021   Problems with hearing, bilateral 02/01/2021   Hyperlipidemia 12/23/2020   Acute right ankle pain 11/25/2020   Accident due to mechanical fall without injury 09/07/2020   CIN III (cervical intraepithelial neoplasia III) 08/10/2020   Fibromyalgia 08/10/2020   Heartburn 08/10/2020   Sjogren's syndrome (HCC) 11/16/2016   Severe depression (HCC) 11/16/2016   Chronic arm pain 01/01/2016   Abnormal menstrual periods 07/13/2015   Iron deficiency anemia 03/18/2015   Ovarian cyst 02/20/2015   Seasonal allergies 01/27/2015   Tobacco abuse 01/12/2015   COPD (chronic obstructive pulmonary disease) (HCC) 01/12/2015   Lysle Rubens, PT, DPT Maryanna Shape Kathline Banbury 05/12/2021, 11:41 AM  [facility] [address] [place], [place], [address] Phone: [phone]   Fax:  [phone]  Name: [name] MRN: [id number] Date of Birth: Dec 30, 1975

## 2021-05-21 ENCOUNTER — Other Ambulatory Visit: Payer: Self-pay

## 2021-05-21 ENCOUNTER — Ambulatory Visit: Payer: Medicaid Other | Attending: Internal Medicine

## 2021-05-21 DIAGNOSIS — G8929 Other chronic pain: Secondary | ICD-10-CM | POA: Diagnosis not present

## 2021-05-21 DIAGNOSIS — R252 Cramp and spasm: Secondary | ICD-10-CM

## 2021-05-21 DIAGNOSIS — R262 Difficulty in walking, not elsewhere classified: Secondary | ICD-10-CM | POA: Insufficient documentation

## 2021-05-21 DIAGNOSIS — M5442 Lumbago with sciatica, left side: Secondary | ICD-10-CM | POA: Insufficient documentation

## 2021-05-21 DIAGNOSIS — M6281 Muscle weakness (generalized): Secondary | ICD-10-CM | POA: Diagnosis not present

## 2021-05-21 DIAGNOSIS — M5441 Lumbago with sciatica, right side: Secondary | ICD-10-CM | POA: Insufficient documentation

## 2021-05-21 NOTE — Therapy (Signed)
[facility] [address] [place], [place], [address] Phone: [phone]   Fax:  [phone]  Physical Therapy Treatment  Patient Details  Name: [name] MRN: [id number] Date of Birth: Mar 10, 1976 Referring Provider (PT): Guilloud   Encounter Date: 05/21/2021   PT End of Session - 05/21/21 0850     Visit Number 2    Date for PT Re-Evaluation 08/04/21    PT Start Time 0845    PT Stop Time 0930    PT Time Calculation (min) 45 min    Activity Tolerance Patient limited by pain    Behavior During Therapy [facility] for tasks assessed/performed             Past Medical History:  Diagnosis Date   Asthma    Carpal tunnel syndrome    CIN III (cervical intraepithelial neoplasia III) 2017   Seen on colposcopy. LEEP actually showed CIN II with negative margins   COPD (chronic obstructive pulmonary disease) (HCC)    Hemorrhoids    Iron deficiency anemia    Sjogren's syndrome (HCC)     Past Surgical History:  Procedure Laterality Date   CESAREAN SECTION     Tubal ligation      There were no vitals filed for this visit.   Subjective Assessment - 05/21/21 0848     Subjective no new falls since eval.  just woke up not too long ago so back is hurting    Pertinent History fibromyalgia    Limitations Sitting;Standing;Walking    How long can you stand comfortably? 30 minutes at most    How long can you walk comfortably? 45 min-1hr "if I have to"    Diagnostic tests xrays lumbar spine; MRI ordered but denied prior to completing more PT    Patient Stated Goals reduce pain    Currently in Pain? Yes    Pain Score 8     Pain Location Back    Pain Orientation Right;Left    Pain Radiating Towards into BLE    Pain Onset More than a month ago                               [facility] Adult PT Treatment/Exercise - 05/21/21 0001       Lumbar Exercises: Stretches   Hip Flexor Stretch Right;Left;2 reps;10 seconds    modified thomas with 1 leg off table   Piriformis Stretch Right;Left;2 reps;10 seconds   2 resp each side with forward lean, 2 reps each with pull to opp shoulder   Gastroc Stretch Right;Left;2 reps;30 seconds    Other Lumbar Stretch Exercise trunk extension x 10      Lumbar Exercises: Aerobic   Nustep L5 x 6      Lumbar Exercises: Machines for Strengthening   Cybex Lumbar Extension black tband 2 sets 10    Other Lumbar Machine Exercise seated row and lats 20# 2 sets 10      Lumbar Exercises: Standing   Heel Raises 15 reps    Other Standing Lumbar Exercises wt ball trunk ext and rotation 10 x each - yellow med ball      Lumbar Exercises: Seated   Sit to Stand 20 reps   with OHP yellow med ball     Lumbar Exercises: Supine   Bridge 10 reps    Other Supine Lumbar Exercises feet on ball bridge,  DKTC and obl 15 x  2each      Lumbar Exercises: Quadruped   Opposite Arm/Leg Raise 10 reps   2 sets   Opposite Arm/Leg Raise Limitations B alternating                      PT Short Term Goals - 05/12/21 1138       PT SHORT TERM GOAL #1   Title Pt will be I with initial HEP    Time 2    Period Weeks    Status New    Target Date 05/26/21               PT Long Term Goals - 05/12/21 1138       PT LONG TERM GOAL #1   Title Pt will be I with advanced HEP    Time 8    Period Weeks    Status New    Target Date 07/07/21      PT LONG TERM GOAL #2   Title Pt will demo lumbar AROM <25% limited with no increase in LBP    Time 8    Period Weeks    Status New    Target Date 07/07/21      PT LONG TERM GOAL #3   Title Pt will demo 5x STS without UEs <15 sec with no increase in LBP    Time 8    Period Weeks    Status New    Target Date 07/07/21      PT LONG TERM GOAL #4   Title Pt will report 50% reduction in LBP    Time 8    Period Weeks    Status New    Target Date 07/07/21      PT LONG TERM GOAL #5   Title Pt will report able to perform all ADLs with  </=3/10 LBP    Time 8    Period Weeks    Status New    Target Date 07/07/21                   Plan - 05/21/21 0851     Clinical Impression Statement Sylvana tolerated all interventions fairly today. Instability noted with QP strength exercises. Exercises with an extension bias as that reportedly helps. No reports of increased pain and overall agreeable to exercises today.  Intermittent rests between sets as needed d/t fatigue. Responded really nicely to stretch progressions. May like to try traction again next visit.    Examination-Activity Limitations Bend;Locomotion Level;Stand;Sit    Examination-Participation Restrictions Community Activity;Interpersonal Relationship;Cleaning;Meal Prep;Laundry    Stability/Clinical Decision Making Evolving/Moderate complexity    Rehab Potential Good    PT Frequency 1x / week    PT Duration 8 weeks    PT Treatment/Interventions ADLs/Self Care Home Management;Electrical Stimulation;Iontophoresis 4mg /ml Dexamethasone;Moist Heat;Traction;Neuromuscular re-education;Therapeutic exercise;Therapeutic activities;Functional mobility training;Gait training;Patient/family education;Manual techniques;Dry needling;Passive range of motion    PT Next Visit Plan gentle progression to TE, lumbar/LE flexibility, core/lumbar stab, manual/modalities as indicated    PT Home Exercise Plan resuming former HEP; issued new TB, pt states she still has printouts    Consulted and Agree with Plan of Care Patient             Patient will benefit from skilled therapeutic intervention in order to improve the following deficits and impairments:  Abnormal gait, Decreased range of motion, Difficulty walking, Increased muscle spasms, Decreased activity tolerance, Pain, Hypomobility, Impaired flexibility, Decreased strength, Postural dysfunction  Visit Diagnosis: Chronic bilateral  low back pain with bilateral sciatica  Muscle weakness (generalized)  Difficulty in walking, not  elsewhere classified  Cramp and spasm     Problem List Patient Active Problem List   Diagnosis Date Noted   Recurrent syncope 04/27/2021   Dog bite 04/13/2021   Flank pain 02/19/2021   Lumbosacral radiculopathy 02/01/2021   Problems with hearing, bilateral 02/01/2021   Hyperlipidemia 12/23/2020   Acute right ankle pain 11/25/2020   Accident due to mechanical fall without injury 09/07/2020   CIN III (cervical intraepithelial neoplasia III) 08/10/2020   Fibromyalgia 08/10/2020   Heartburn 08/10/2020   Sjogren's syndrome (HCC) 11/16/2016   Severe depression (HCC) 11/16/2016   Chronic arm pain 01/01/2016   Abnormal menstrual periods 07/13/2015   Iron deficiency anemia 03/18/2015   Ovarian cyst 02/20/2015   Seasonal allergies 01/27/2015   Tobacco abuse 01/12/2015   COPD (chronic obstructive pulmonary disease) (HCC) 01/12/2015    Anson Crofts, PT, DPT 05/21/2021, 9:27 AM  [facility] [address] [place], [place], [address] Phone: [phone]   Fax:  [phone]  Name: MICAELLA GITTO MRN: [id number] Date of Birth: 12-Jul-1976

## 2021-05-28 ENCOUNTER — Ambulatory Visit: Payer: Medicaid Other

## 2021-06-04 ENCOUNTER — Other Ambulatory Visit: Payer: Self-pay

## 2021-06-04 ENCOUNTER — Encounter: Payer: Self-pay | Admitting: Physical Therapy

## 2021-06-04 ENCOUNTER — Ambulatory Visit: Payer: Medicaid Other | Admitting: Physical Therapy

## 2021-06-04 DIAGNOSIS — R262 Difficulty in walking, not elsewhere classified: Secondary | ICD-10-CM | POA: Diagnosis not present

## 2021-06-04 DIAGNOSIS — M6281 Muscle weakness (generalized): Secondary | ICD-10-CM

## 2021-06-04 DIAGNOSIS — G8929 Other chronic pain: Secondary | ICD-10-CM | POA: Diagnosis not present

## 2021-06-04 DIAGNOSIS — M5442 Lumbago with sciatica, left side: Secondary | ICD-10-CM | POA: Diagnosis not present

## 2021-06-04 DIAGNOSIS — R252 Cramp and spasm: Secondary | ICD-10-CM | POA: Diagnosis not present

## 2021-06-04 DIAGNOSIS — M5441 Lumbago with sciatica, right side: Secondary | ICD-10-CM | POA: Diagnosis not present

## 2021-06-04 NOTE — Therapy (Signed)
[facility] [place] [address] [place], [place], [address] Phone: [phone]   Fax:  [phone]  Physical Therapy Treatment  Patient Details  Name: [name] MRN: [id number] Date of Birth: Dec 16, 1975 Referring Provider (PT): Guilloud   Encounter Date: 06/04/2021   PT End of Session - 06/04/21 0926     Visit Number 3    Date for PT Re-Evaluation 08/04/21    PT Start Time 0846    PT Stop Time 0926    PT Time Calculation (min) 40 min    Activity Tolerance Patient tolerated treatment well    Behavior During Therapy [facility] for tasks assessed/performed             Past Medical History:  Diagnosis Date   Asthma    Carpal tunnel syndrome    CIN III (cervical intraepithelial neoplasia III) 2017   Seen on colposcopy. LEEP actually showed CIN II with negative margins   COPD (chronic obstructive pulmonary disease) (HCC)    Hemorrhoids    Iron deficiency anemia    Sjogren's syndrome (HCC)     Past Surgical History:  Procedure Laterality Date   CESAREAN SECTION     Tubal ligation      There were no vitals filed for this visit.   Subjective Assessment - 06/04/21 0849     Subjective Doing pretty good with her exercises. Back feels like it needs to pop but won't. Denies new falls.    Pertinent History fibromyalgia    Diagnostic tests xrays lumbar spine; MRI ordered but denied prior to completing more PT    Patient Stated Goals reduce pain    Currently in Pain? Yes    Pain Score 6     Pain Location Back    Pain Orientation Right;Left;Lower    Pain Descriptors / Indicators Aching    Pain Type Chronic pain;Acute pain                               [facility] Adult PT Treatment/Exercise - 06/04/21 0001       Lumbar Exercises: Stretches   Passive Hamstring Stretch Right;Left;1 rep;30 seconds    Passive Hamstring Stretch Limitations supine with strap   cues to relax UEs/neck   Piriformis Stretch  Right;Left;1 rep;30 seconds    Piriformis Stretch Limitations supine KTOS    Figure 4 Stretch 1 rep;30 seconds;With overpressure    Figure 4 Stretch Limitations supine      Lumbar Exercises: Aerobic   Recumbent Bike L1 x 6 min      Lumbar Exercises: Standing   Row Strengthening;15 reps;Theraband    Theraband Level (Row) Level 3 (Green)    Row Limitations cues to tuck elbows down    Other Standing Lumbar Exercises R/L resisted trunk rotation with green TB 10x   cues to slow down on eccentric phase     Lumbar Exercises: Seated   Other Seated Lumbar Exercises prayer stretch pball 10x3"   R shoulder/elbow "pop" non painful     Lumbar Exercises: Supine   Bridge 10 reps   cues to avoid digging through heels   Bridge with clamshell 10 reps   red TB   Other Supine Lumbar Exercises bent knee fallout red TB 2x10   cues to isolate opposite LE     Lumbar Exercises: Sidelying   Hip Abduction Right;Left;10 reps    Hip Abduction Limitations cues for alignment- good ability  to maintain    Other Sidelying Lumbar Exercises R/L hip adduction 10x with opposite LE elevated on bolster    Other Sidelying Lumbar Exercises R/L open book 10x to tolerance      Knee/Hip Exercises: Standing   Functional Squat 1 set;10 reps    Functional Squat Limitations squat to elevated mat   bracing on thighs; cues to shift hips back                     PT Short Term Goals - 06/04/21 0927       PT SHORT TERM GOAL #1   Title Pt will be I with initial HEP    Time 2    Period Weeks    Status On-going    Target Date 05/26/21               PT Long Term Goals - 06/04/21 0927       PT LONG TERM GOAL #1   Title Pt will be I with advanced HEP    Time 8    Period Weeks    Status On-going      PT LONG TERM GOAL #2   Title Pt will demo lumbar AROM <25% limited with no increase in LBP    Time 8    Period Weeks    Status On-going      PT LONG TERM GOAL #3   Title Pt will demo 5x STS without UEs  <15 sec with no increase in LBP    Time 8    Period Weeks    Status On-going      PT LONG TERM GOAL #4   Title Pt will report 50% reduction in LBP    Time 8    Period Weeks    Status On-going      PT LONG TERM GOAL #5   Title Pt will report able to perform all ADLs with </=3/10 LBP    Time 8    Period Weeks    Status On-going                   Plan - 06/04/21 0867     Clinical Impression Statement Patient arrived to session without new complaints and denies new falls. Notes good compliance with HEP. Patient performed progressive hip and core mat ther-ex. Corrective cueing required to maintain hip stability with resisted ER. Intermittent cueing required to relax upper body and cervical musculature. Limited thoracolumbar rotation ROM was evident with gentle stretching today. Duration of session was performed pain-free, despite c/o muscle fatigue. Patient reported improvement in LBP after ther-ex.    Examination-Activity Limitations Bend;Locomotion Level;Stand;Sit    Examination-Participation Restrictions Community Activity;Interpersonal Relationship;Cleaning;Meal Prep;Laundry    Stability/Clinical Decision Making Evolving/Moderate complexity    Rehab Potential Good    PT Frequency 1x / week    PT Duration 8 weeks    PT Treatment/Interventions ADLs/Self Care Home Management;Electrical Stimulation;Iontophoresis 4mg /ml Dexamethasone;Moist Heat;Traction;Neuromuscular re-education;Therapeutic exercise;Therapeutic activities;Functional mobility training;Gait training;Patient/family education;Manual techniques;Dry needling;Passive range of motion    PT Next Visit Plan gentle progression to TE, lumbar/LE flexibility, core/lumbar stab, manual/modalities as indicated    PT Home Exercise Plan resuming former HEP; issued new TB, pt states she still has printouts    Consulted and Agree with Plan of Care Patient             Patient will benefit from skilled therapeutic intervention in  order to improve the following deficits and impairments:  Abnormal gait,  Decreased range of motion, Difficulty walking, Increased muscle spasms, Decreased activity tolerance, Pain, Hypomobility, Impaired flexibility, Decreased strength, Postural dysfunction  Visit Diagnosis: Chronic bilateral low back pain with bilateral sciatica  Muscle weakness (generalized)  Difficulty in walking, not elsewhere classified  Cramp and spasm     Problem List Patient Active Problem List   Diagnosis Date Noted   Recurrent syncope 04/27/2021   Dog bite 04/13/2021   Flank pain 02/19/2021   Lumbosacral radiculopathy 02/01/2021   Problems with hearing, bilateral 02/01/2021   Hyperlipidemia 12/23/2020   Acute right ankle pain 11/25/2020   Accident due to mechanical fall without injury 09/07/2020   CIN III (cervical intraepithelial neoplasia III) 08/10/2020   Fibromyalgia 08/10/2020   Heartburn 08/10/2020   Sjogren's syndrome (HCC) 11/16/2016   Severe depression (HCC) 11/16/2016   Chronic arm pain 01/01/2016   Abnormal menstrual periods 07/13/2015   Iron deficiency anemia 03/18/2015   Ovarian cyst 02/20/2015   Seasonal allergies 01/27/2015   Tobacco abuse 01/12/2015   COPD (chronic obstructive pulmonary disease) (HCC) 01/12/2015    Anette Guarneri, PT, DPT 06/04/21 9:28 AM    [facility] [place] [facility] [address] [place], [place], [address] Phone: [phone]   Fax:  [phone]  Name: [name] MRN: [id number] Date of Birth: August 19, 1976

## 2021-06-09 ENCOUNTER — Other Ambulatory Visit: Payer: Self-pay | Admitting: Physician Assistant

## 2021-06-09 DIAGNOSIS — Z1231 Encounter for screening mammogram for malignant neoplasm of breast: Secondary | ICD-10-CM

## 2021-06-11 ENCOUNTER — Encounter: Payer: Self-pay | Admitting: Physical Therapy

## 2021-06-11 ENCOUNTER — Other Ambulatory Visit: Payer: Self-pay

## 2021-06-11 ENCOUNTER — Ambulatory Visit: Payer: Medicaid Other | Admitting: Physical Therapy

## 2021-06-11 DIAGNOSIS — M6281 Muscle weakness (generalized): Secondary | ICD-10-CM | POA: Diagnosis not present

## 2021-06-11 DIAGNOSIS — G8929 Other chronic pain: Secondary | ICD-10-CM | POA: Diagnosis not present

## 2021-06-11 DIAGNOSIS — M5442 Lumbago with sciatica, left side: Secondary | ICD-10-CM | POA: Diagnosis not present

## 2021-06-11 DIAGNOSIS — R262 Difficulty in walking, not elsewhere classified: Secondary | ICD-10-CM

## 2021-06-11 DIAGNOSIS — R252 Cramp and spasm: Secondary | ICD-10-CM | POA: Diagnosis not present

## 2021-06-11 DIAGNOSIS — M5441 Lumbago with sciatica, right side: Secondary | ICD-10-CM | POA: Diagnosis not present

## 2021-06-11 NOTE — Therapy (Signed)
[facility] [place] [facility] [address] [place], [place], [address] Phone: [phone]   Fax:  [phone]  Physical Therapy Treatment  Patient Details  Name: [name] MRN: [id number] Date of Birth: 10-Feb-1976 Referring Provider (PT): Guilloud   Encounter Date: 06/11/2021   PT End of Session - 06/11/21 0925     Visit Number 4    Date for PT Re-Evaluation 08/04/21    PT Start Time 0846    PT Stop Time 0924    PT Time Calculation (min) 38 min    Activity Tolerance Patient tolerated treatment well    Behavior During Therapy [facility] for tasks assessed/performed             Past Medical History:  Diagnosis Date   Asthma    Carpal tunnel syndrome    CIN III (cervical intraepithelial neoplasia III) 2017   Seen on colposcopy. LEEP actually showed CIN II with negative margins   COPD (chronic obstructive pulmonary disease) (HCC)    Hemorrhoids    Iron deficiency anemia    Sjogren's syndrome (HCC)     Past Surgical History:  Procedure Laterality Date   CESAREAN SECTION     Tubal ligation      There were no vitals filed for this visit.   Subjective Assessment - 06/11/21 0847     Subjective Husband woke her up early this AM. Body hurts all over.    Pertinent History fibromyalgia    Diagnostic tests xrays lumbar spine; MRI ordered but denied prior to completing more PT    Patient Stated Goals reduce pain    Currently in Pain? Yes    Pain Score 7     Pain Location Generalized    Pain Orientation --    Pain Descriptors / Indicators Aching    Pain Type Acute pain;Chronic pain                               [facility] Adult PT Treatment/Exercise - 06/11/21 0001       Lumbar Exercises: Aerobic   Nustep L5 x 6      Lumbar Exercises: Supine   Pelvic Tilt 10 reps    Pelvic Tilt Limitations more success than quadruped    Other Supine Lumbar Exercises LTR 10x      Lumbar Exercises: Prone   Other Prone Lumbar  Exercises prone on hands 10x3"   fairly good ROM     Lumbar Exercises: Quadruped   Madcat/Old Horse 10 reps    Madcat/Old Horse Limitations considerable difficulty with anterior pelvic tilt      Knee/Hip Exercises: Standing   Other Standing Knee Exercises sidestepping with yellow loop around ankles 4x 6ft   c/o muscle burn     Knee/Hip Exercises: Seated   Sit to Sand 1 set;without UE support   holding blue medball at chest; cues to avoid genu valgus     Knee/Hip Exercises: Sidelying   Other Sidelying Knee/Hip Exercises R/L open book stretch 10x   to tolerance                   PT Education - 06/11/21 0924     Education Details update to HEP; edu on importance of daily movement and taking time for physical self-care; encouraged patient to perfor HEP every AM for 5 min    Person(s) Educated Patient    Methods Explanation;Tactile cues;Demonstration;Verbal cues;Handout  Comprehension Verbalized understanding;Returned demonstration              PT Short Term Goals - 06/04/21 0927       PT SHORT TERM GOAL #1   Title Pt will be I with initial HEP    Time 2    Period Weeks    Status On-going    Target Date 05/26/21               PT Long Term Goals - 06/04/21 0927       PT LONG TERM GOAL #1   Title Pt will be I with advanced HEP    Time 8    Period Weeks    Status On-going      PT LONG TERM GOAL #2   Title Pt will demo lumbar AROM <25% limited with no increase in LBP    Time 8    Period Weeks    Status On-going      PT LONG TERM GOAL #3   Title Pt will demo 5x STS without UEs <15 sec with no increase in LBP    Time 8    Period Weeks    Status On-going      PT LONG TERM GOAL #4   Title Pt will report 50% reduction in LBP    Time 8    Period Weeks    Status On-going      PT LONG TERM GOAL #5   Title Pt will report able to perform all ADLs with </=3/10 LBP    Time 8    Period Weeks    Status On-going                   Plan -  06/11/21 0925     Clinical Impression Statement Patient reporting generalized pain this AM from being busy helping her kids this week.  Worked on gentle thoracolumbar mobility with encouragement to perform to a tolerable ROM. Patient was agreeable to perform all ther-ex today despite c/o some discomfort. Patient demonstrated considerable difficulty with anterior pelvic tilting in quadruped; better motor control and understanding in supine. Also able to initiate lumbar extension mobility work without complaints and with fairly good ROM. Patient reported "actually I feel so much better" after spinal mobility ther-ex. Patient performed progressive LE strengthening ther-ex with intermittent cueing for proper alignment. Took time to update HEP and educated patient on importance of daily movement and taking time for physical self-care despite being a busy mom. Patient reported understanding of all edu provided today and without complaints at end of session.    Examination-Activity Limitations Bend;Locomotion Level;Stand;Sit    Examination-Participation Restrictions Community Activity;Interpersonal Relationship;Cleaning;Meal Prep;Laundry    Stability/Clinical Decision Making Evolving/Moderate complexity    Rehab Potential Good    PT Frequency 1x / week    PT Duration 8 weeks    PT Treatment/Interventions ADLs/Self Care Home Management;Electrical Stimulation;Iontophoresis 4mg /ml Dexamethasone;Moist Heat;Traction;Neuromuscular re-education;Therapeutic exercise;Therapeutic activities;Functional mobility training;Gait training;Patient/family education;Manual techniques;Dry needling;Passive range of motion    PT Next Visit Plan gentle progression to TE, lumbar/LE flexibility, core/lumbar stab, manual/modalities as indicated    PT Home Exercise Plan resuming former HEP; issued new TB, pt states she still has printouts    Consulted and Agree with Plan of Care Patient             Patient will benefit from  skilled therapeutic intervention in order to improve the following deficits and impairments:  Abnormal gait, Decreased range of motion, Difficulty walking,  Increased muscle spasms, Decreased activity tolerance, Pain, Hypomobility, Impaired flexibility, Decreased strength, Postural dysfunction  Visit Diagnosis: Chronic bilateral low back pain with bilateral sciatica  Muscle weakness (generalized)  Difficulty in walking, not elsewhere classified  Cramp and spasm     Problem List Patient Active Problem List   Diagnosis Date Noted   Recurrent syncope 04/27/2021   Dog bite 04/13/2021   Flank pain 02/19/2021   Lumbosacral radiculopathy 02/01/2021   Problems with hearing, bilateral 02/01/2021   Hyperlipidemia 12/23/2020   Acute right ankle pain 11/25/2020   Accident due to mechanical fall without injury 09/07/2020   CIN III (cervical intraepithelial neoplasia III) 08/10/2020   Fibromyalgia 08/10/2020   Heartburn 08/10/2020   Sjogren's syndrome (HCC) 11/16/2016   Severe depression (HCC) 11/16/2016   Chronic arm pain 01/01/2016   Abnormal menstrual periods 07/13/2015   Iron deficiency anemia 03/18/2015   Ovarian cyst 02/20/2015   Seasonal allergies 01/27/2015   Tobacco abuse 01/12/2015   COPD (chronic obstructive pulmonary disease) (HCC) 01/12/2015     Anette Guarneri, PT, DPT 06/11/21 10:14 AM    [facility] [place] [facility] [address] [place], [place], [address] Phone: [phone]   Fax:  [phone]  Name: [name] MRN: [id number] Date of Birth: 04-Mar-1976

## 2021-06-11 NOTE — Patient Instructions (Signed)
Access Code: [email or web address] URL: [email or web address] Date: 06/11/2021 Prepared by: Georgina Peer  Exercises Sidelying Thoracic Rotation with Open Book - 2 x daily - 7 x weekly - 2 sets - 10 reps Prone Press Up - 2 x daily - 7 x weekly - 2 sets - 10 reps - 3 sec hold Supine Bridge with Resistance Band - 2 x daily - 7 x weekly - 2 sets - 10 reps Standing Trunk Rotation with Resistance - 2 x daily - 7 x weekly - 2 sets - 10 reps

## 2021-06-16 ENCOUNTER — Other Ambulatory Visit: Payer: Self-pay

## 2021-06-16 ENCOUNTER — Ambulatory Visit: Payer: Medicaid Other

## 2021-06-16 DIAGNOSIS — M6281 Muscle weakness (generalized): Secondary | ICD-10-CM | POA: Diagnosis not present

## 2021-06-16 DIAGNOSIS — R262 Difficulty in walking, not elsewhere classified: Secondary | ICD-10-CM

## 2021-06-16 DIAGNOSIS — M5442 Lumbago with sciatica, left side: Secondary | ICD-10-CM | POA: Diagnosis not present

## 2021-06-16 DIAGNOSIS — G8929 Other chronic pain: Secondary | ICD-10-CM | POA: Diagnosis not present

## 2021-06-16 DIAGNOSIS — M5441 Lumbago with sciatica, right side: Secondary | ICD-10-CM | POA: Diagnosis not present

## 2021-06-16 DIAGNOSIS — R252 Cramp and spasm: Secondary | ICD-10-CM | POA: Diagnosis not present

## 2021-06-16 NOTE — Therapy (Addendum)
[facility] [place] [facility] [address] [place], [place], [address] Phone: [phone]   Fax:  [phone]  Physical Therapy Treatment/ Progress Update  Patient Details  Name: [name] MRN: [id number] Date of Birth: January 31, 1976 Referring Provider (PT): Guilloud   Encounter Date: 06/16/2021   PT End of Session - 06/16/21 1719     Visit Number 5    Date for PT Re-Evaluation 08/04/21    Authorization Type [employer]    Authorization Time Period 4/8 by 06/16/21    PT Start Time 1715    PT Stop Time 1800    PT Time Calculation (min) 45 min    Activity Tolerance Patient tolerated treatment well    Behavior During Therapy [facility] for tasks assessed/performed             Past Medical History:  Diagnosis Date   Asthma    Carpal tunnel syndrome    CIN III (cervical intraepithelial neoplasia III) 2017   Seen on colposcopy. LEEP actually showed CIN II with negative margins   COPD (chronic obstructive pulmonary disease) (HCC)    Hemorrhoids    Iron deficiency anemia    Sjogren's syndrome (HCC)     Past Surgical History:  Procedure Laterality Date   CESAREAN SECTION     Tubal ligation      There were no vitals filed for this visit.   Subjective Assessment - 06/16/21 1718     Subjective same back pain, thinks PT helps. "today has been worst day in a while" "was able to do more yesterday"    Pertinent History fibromyalgia    Diagnostic tests xrays lumbar spine; MRI ordered but denied prior to completing more PT    Patient Stated Goals reduce pain    Currently in Pain? Yes    Pain Score 10-Worst pain ever   "a 12"   Pain Location Back    Pain Orientation Right;Left;Lower   and central LB   Pain Radiating Towards into BLE                [facility] PT Assessment - 06/16/21 0001       Assessment   Medical Diagnosis LBP    Referring Provider (PT) Guilloud    Onset Date/Surgical Date --   2014   Hand Dominance Right    Next MD  Visit --   07/20/2021 with neurologist; will call back and schedule with referring MD as needed   Prior Therapy PT here for LBP      Observation/Other Assessments   Focus on Therapeutic Outcomes (FOTO)  35%      AROM   AROM Assessment Site Lumbar   no pain and full, WFL post TE at end of session.    Lumbar Flexion limited 75% + pain    Lumbar Extension limited 75% + pain    Lumbar - Right Side Bend limited 80% + pain    Lumbar - Left Side Bend limited 75% + pain    Lumbar - Right Rotation limited 75% + pain    Lumbar - Left Rotation limited 75% + pain      Flexibility   Hamstrings mild tightness B    ITB mild tightness B    Piriformis mild tightness B                           [facility] Adult PT Treatment/Exercise - 06/16/21 0001  Transfers   Five time sit to stand comments  17 seconds, increased LBP      Lumbar Exercises: Stretches   Pelvic Tilt 20 reps   supine , reported some LB relief with Anterior pelvic tilts.     Lumbar Exercises: Aerobic   Nustep L5 x 6 min      Lumbar Exercises: Machines for Strengthening   Cybex Lumbar Extension black tband 2 sets 10      Lumbar Exercises: Standing   Other Standing Lumbar Exercises red pball rolls up wall with functional trunk extension x 15      Lumbar Exercises: Supine   Bridge --   2 x 10 on green pball   Other Supine Lumbar Exercises LTR 10x L/R with legs on ball.      Lumbar Exercises: Prone   Other Prone Lumbar Exercises prone on hands 10x3", 2 sets   fairly good ROM and reports some relief     Knee/Hip Exercises: Sidelying   Hip ABduction Strengthening;Both;10 reps;2 sets   no pain reported, only some mms fatigue.   Other Sidelying Knee/Hip Exercises R/L open book stretch 10x   to tolerance                     PT Short Term Goals - 06/16/21 1722       PT SHORT TERM GOAL #1   Title Pt will be I with initial HEP    Time 2    Period Weeks    Status Achieved    Target Date 05/26/21                PT Long Term Goals - 06/16/21 1722       PT LONG TERM GOAL #1   Title Pt will be I with advanced HEP    Time 8    Period Weeks    Status On-going      PT LONG TERM GOAL #2   Title Pt will demo lumbar AROM <25% limited with no increase in LBP    Time 8    Period Weeks    Status On-going      PT LONG TERM GOAL #3   Title Pt will demo 5x STS without UEs <15 sec with no increase in LBP    Baseline on 7/27: unable secondary to pain    Time 8    Period Weeks    Status On-going   06/16/21: 17 seconds, increased LBP but no UE assist     PT LONG TERM GOAL #4   Title Pt will report 50% reduction in LBP    Time 8    Period Weeks    Status On-going      PT LONG TERM GOAL #5   Title Pt will report able to perform all ADLs with </=3/10 LBP    Time 8    Period Weeks    Status On-going                   Plan - 06/16/21 1720     Clinical Impression Statement Pt continues to have high level LBP with symptoms into LE. She does report she feels better with PT, after doing exercises and although LBP persists, she does feel like the weakness she felt in her legs originally has improved. FOTO for low back function continues to be low, also considering that today pt does enter clinic reporting pain worse than it had been. Reports she has been completing  updated HEP and it does help. She demonstrated excellent tolerance to therex today, noting decreased pain with pelvic tilts (anterior provided more relief than posterior), and overall jmainly mms fatigue. Lumbar ROM at start of session demonstrated continued limitations d/t pain unchanged from eval on 05/12/21, however when reassessed end of session after TE she demonstrated improvements, range [facility] and not as limited by pain.  She will benefit from continued skilled PT to work on decreasing pain more consistently, improving strength and functional mobility.    Examination-Activity Limitations Bend;Locomotion Level;Stand;Sit     Examination-Participation Restrictions Community Activity;Interpersonal Relationship;Cleaning;Meal Prep;Laundry    Stability/Clinical Decision Making Evolving/Moderate complexity    Rehab Potential Good    PT Frequency 1x / week        PT Treatment/Interventions ADLs/Self Care Home Management;Moist Heat;Neuromuscular re-education;Therapeutic exercise;Therapeutic activities;Functional mobility training;Gait training;Patient/family education;Manual techniques;Passive range of motion;Dry needling    PT Next Visit Plan gentle progression to TE, lumbar/LE flexibility, core/lumbar stab, manual/modalities as indicated    PT Home Exercise Plan updated HEP    Consulted and Agree with Plan of Care Patient             Patient will benefit from skilled therapeutic intervention in order to improve the following deficits and impairments:  Abnormal gait, Decreased range of motion, Difficulty walking, Increased muscle spasms, Decreased activity tolerance, Pain, Hypomobility, Impaired flexibility, Decreased strength, Postural dysfunction  Visit Diagnosis: Chronic bilateral low back pain with bilateral sciatica  Muscle weakness (generalized)  Difficulty in walking, not elsewhere classified  Cramp and spasm     Problem List Patient Active Problem List   Diagnosis Date Noted   Recurrent syncope 04/27/2021   Dog bite 04/13/2021   Flank pain 02/19/2021   Lumbosacral radiculopathy 02/01/2021   Problems with hearing, bilateral 02/01/2021   Hyperlipidemia 12/23/2020   Acute right ankle pain 11/25/2020   Accident due to mechanical fall without injury 09/07/2020   CIN III (cervical intraepithelial neoplasia III) 08/10/2020   Fibromyalgia 08/10/2020   Heartburn 08/10/2020   Sjogren's syndrome (HCC) 11/16/2016   Severe depression (HCC) 11/16/2016   Chronic arm pain 01/01/2016   Abnormal menstrual periods 07/13/2015   Iron deficiency anemia 03/18/2015   Ovarian cyst 02/20/2015   Seasonal  allergies 01/27/2015   Tobacco abuse 01/12/2015   COPD (chronic obstructive pulmonary disease) (HCC) 01/12/2015    Anson Crofts, PT, DPT 06/16/2021, 6:06 PM  [facility] [place] [facility] [address] [place], [place], [address] Phone: [phone]   Fax:  [phone]  Name: [name] MRN: [id number] Date of Birth: Aug 12, 1976

## 2021-06-18 ENCOUNTER — Ambulatory Visit: Payer: Medicaid Other | Admitting: Physical Therapy

## 2021-06-23 ENCOUNTER — Ambulatory Visit: Payer: Medicaid Other | Admitting: Physical Therapy

## 2021-07-02 ENCOUNTER — Ambulatory Visit: Payer: Medicaid Other | Admitting: Physical Therapy

## 2021-07-07 ENCOUNTER — Ambulatory Visit: Payer: Medicaid Other | Attending: Internal Medicine | Admitting: Physical Therapy

## 2021-07-07 DIAGNOSIS — M5442 Lumbago with sciatica, left side: Secondary | ICD-10-CM | POA: Insufficient documentation

## 2021-07-07 DIAGNOSIS — M5441 Lumbago with sciatica, right side: Secondary | ICD-10-CM | POA: Insufficient documentation

## 2021-07-07 DIAGNOSIS — G8929 Other chronic pain: Secondary | ICD-10-CM | POA: Insufficient documentation

## 2021-07-07 DIAGNOSIS — R252 Cramp and spasm: Secondary | ICD-10-CM | POA: Insufficient documentation

## 2021-07-07 DIAGNOSIS — M6281 Muscle weakness (generalized): Secondary | ICD-10-CM | POA: Insufficient documentation

## 2021-07-07 DIAGNOSIS — R262 Difficulty in walking, not elsewhere classified: Secondary | ICD-10-CM | POA: Insufficient documentation

## 2021-07-09 ENCOUNTER — Ambulatory Visit: Payer: Medicaid Other | Admitting: Physical Therapy

## 2021-07-09 ENCOUNTER — Encounter: Payer: Self-pay | Admitting: Physical Therapy

## 2021-07-09 ENCOUNTER — Other Ambulatory Visit: Payer: Self-pay

## 2021-07-09 DIAGNOSIS — G8929 Other chronic pain: Secondary | ICD-10-CM | POA: Diagnosis not present

## 2021-07-09 DIAGNOSIS — R262 Difficulty in walking, not elsewhere classified: Secondary | ICD-10-CM | POA: Diagnosis not present

## 2021-07-09 DIAGNOSIS — R252 Cramp and spasm: Secondary | ICD-10-CM | POA: Diagnosis not present

## 2021-07-09 DIAGNOSIS — M6281 Muscle weakness (generalized): Secondary | ICD-10-CM

## 2021-07-09 DIAGNOSIS — M5442 Lumbago with sciatica, left side: Secondary | ICD-10-CM

## 2021-07-09 DIAGNOSIS — M5441 Lumbago with sciatica, right side: Secondary | ICD-10-CM | POA: Diagnosis not present

## 2021-07-09 NOTE — Therapy (Signed)
[facility] [address]. [place], [place], [address] Phone: [phone]   Fax:  [phone]  Physical Therapy Treatment  Patient Details  Name: [name] MRN: [id number] Date of Birth: Jul 17, 1976 Referring Provider (PT): Guilloud   Encounter Date: 07/09/2021   PT End of Session - 07/09/21 1142     Visit Number 6    Date for PT Re-Evaluation 08/04/21    Authorization Type [employer]    Authorization Time Period 2/7 by 08/04/21    PT Start Time 1104    PT Stop Time 1143    PT Time Calculation (min) 39 min    Activity Tolerance Patient tolerated treatment well;Patient limited by fatigue    Behavior During Therapy [facility] for tasks assessed/performed             Past Medical History:  Diagnosis Date   Asthma    Carpal tunnel syndrome    CIN III (cervical intraepithelial neoplasia III) 2017   Seen on colposcopy. LEEP actually showed CIN II with negative margins   COPD (chronic obstructive pulmonary disease) (HCC)    Hemorrhoids    Iron deficiency anemia    Sjogren's syndrome (HCC)     Past Surgical History:  Procedure Laterality Date   CESAREAN SECTION     Tubal ligation      There were no vitals filed for this visit.   Subjective Assessment - 07/09/21 1105     Subjective Hasn't been feeling good for the past week- a sinus cold. Still dealing with it a little today. "Feels like my fibromyalgia is acting up."    Pertinent History fibromyalgia    Diagnostic tests xrays lumbar spine; MRI ordered but denied prior to completing more PT    Patient Stated Goals reduce pain    Currently in Pain? Yes    Pain Score 8     Pain Location Back    Pain Orientation Left    Pain Descriptors / Indicators Aching;Pins and needles    Pain Radiating Towards into L LE to foot                               [facility] Adult PT Treatment/Exercise - 07/09/21 0001       Lumbar Exercises: Aerobic   Nustep L5 x  6 min      Lumbar Exercises: Supine   Pelvic Tilt 10 reps    Pelvic Tilt Limitations cues to avoid straining    Clam 15 reps;3 seconds   green TB   Bridge with [employer] 10 reps;3 seconds    Bridge with clamshell 10 reps    Bridge with [employer] Limitations with green TB    Other Supine Lumbar Exercises LTR 10x   cues to avoid pushing into pain     Lumbar Exercises: Prone   Straight Leg Raise 10 reps    Straight Leg Raises Limitations knee straight; each LE   cues to avoid hip/torso rotation   Other Prone Lumbar Exercises prone on elbows 10x3", 2 sets      Knee/Hip Exercises: Stretches   Gastroc Stretch Right;Left;30 seconds;2 reps    Gastroc Stretch Limitations sitting with strap      Knee/Hip Exercises: Standing   Other Standing Knee Exercises sidestepping with red loop around ankles 4x 57ft      Knee/Hip Exercises: Seated   Sit to Sand 1 set;10 reps;without UE support  holding 10# a chest                      PT Short Term Goals - 06/16/21 1722       PT SHORT TERM GOAL #1   Title Pt will be I with initial HEP    Time 2    Period Weeks    Status Achieved    Target Date 05/26/21               PT Long Term Goals - 06/16/21 1722       PT LONG TERM GOAL #1   Title Pt will be I with advanced HEP    Time 8    Period Weeks    Status On-going      PT LONG TERM GOAL #2   Title Pt will demo lumbar AROM <25% limited with no increase in LBP    Time 8    Period Weeks    Status On-going      PT LONG TERM GOAL #3   Title Pt will demo 5x STS without UEs <15 sec with no increase in LBP    Baseline on 7/27: unable secondary to pain    Time 8    Period Weeks    Status On-going   06/16/21: 17 seconds, increased LBP but no UE assist     PT LONG TERM GOAL #4   Title Pt will report 50% reduction in LBP    Time 8    Period Weeks    Status On-going      PT LONG TERM GOAL #5   Title Pt will report able to perform all ADLs with </=3/10 LBP    Time  8    Period Weeks    Status On-going                   Plan - 07/09/21 1143     Clinical Impression Statement Patient arrived to session with report of flare of Fibromyalgia today and N/T in L LE and B feet. Worked on gentle spinal mobility and core stability exercises today. Encouraged patient to move within tolerable ranges of motion d/t increased pain today. Corrective cues given to prevent compensatory hip/torso rotation with glute strengthening. Patient demonstrated slow and low amplitude movements, but was willing to participate in all of today's activities. Patient reported improvement in pain at end of session.    Examination-Activity Limitations Bend;Locomotion Level;Stand;Sit    Examination-Participation Restrictions Community Activity;Interpersonal Relationship;Cleaning;Meal Prep;Laundry    Stability/Clinical Decision Making Evolving/Moderate complexity    Rehab Potential Good    PT Frequency 1x / week    PT Treatment/Interventions ADLs/Self Care Home Management;Moist Heat;Neuromuscular re-education;Therapeutic exercise;Therapeutic activities;Functional mobility training;Gait training;Patient/family education;Manual techniques;Passive range of motion;Dry needling    PT Next Visit Plan gentle progression to TE, lumbar/LE flexibility, core/lumbar stab, manual/modalities as indicated    PT Home Exercise Plan updated HEP    Consulted and Agree with Plan of Care Patient             Patient will benefit from skilled therapeutic intervention in order to improve the following deficits and impairments:  Abnormal gait, Decreased range of motion, Difficulty walking, Increased muscle spasms, Decreased activity tolerance, Pain, Hypomobility, Impaired flexibility, Decreased strength, Postural dysfunction  Visit Diagnosis: Chronic bilateral low back pain with bilateral sciatica  Muscle weakness (generalized)  Difficulty in walking, not elsewhere classified  Cramp and  spasm     Problem List Patient Active  Problem List   Diagnosis Date Noted   Recurrent syncope 04/27/2021   Dog bite 04/13/2021   Flank pain 02/19/2021   Lumbosacral radiculopathy 02/01/2021   Problems with hearing, bilateral 02/01/2021   Hyperlipidemia 12/23/2020   Acute right ankle pain 11/25/2020   Accident due to mechanical fall without injury 09/07/2020   CIN III (cervical intraepithelial neoplasia III) 08/10/2020   Fibromyalgia 08/10/2020   Heartburn 08/10/2020   Sjogren's syndrome (HCC) 11/16/2016   Severe depression (HCC) 11/16/2016   Chronic arm pain 01/01/2016   Abnormal menstrual periods 07/13/2015   Iron deficiency anemia 03/18/2015   Ovarian cyst 02/20/2015   Seasonal allergies 01/27/2015   Tobacco abuse 01/12/2015   COPD (chronic obstructive pulmonary disease) (HCC) 01/12/2015      Anette Guarneri, PT, DPT 07/09/21 11:49 AM   [facility] [address] [place], [place], [address] Phone: [phone]   Fax:  [phone]  Name: VALKYRIE GUARDIOLA MRN: [id number] Date of Birth: [birth year]

## 2021-07-16 ENCOUNTER — Ambulatory Visit: Payer: Medicaid Other | Admitting: Physical Therapy

## 2021-07-19 ENCOUNTER — Ambulatory Visit
Admission: RE | Admit: 2021-07-19 | Discharge: 2021-07-19 | Disposition: A | Discharge status: Home / Self Care | Payer: Medicaid Other | Source: Ambulatory Visit | Attending: Physician Assistant | Admitting: Physician Assistant

## 2021-07-19 ENCOUNTER — Other Ambulatory Visit: Payer: Self-pay

## 2021-07-19 DIAGNOSIS — Z1231 Encounter for screening mammogram for malignant neoplasm of breast: Secondary | ICD-10-CM

## 2021-07-21 ENCOUNTER — Other Ambulatory Visit: Payer: Self-pay | Admitting: Internal Medicine

## 2021-07-21 ENCOUNTER — Ambulatory Visit: Payer: Medicaid Other | Attending: Internal Medicine | Admitting: Physical Therapy
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
## 2021-07-22 ENCOUNTER — Other Ambulatory Visit: Payer: Self-pay

## 2021-07-22 ENCOUNTER — Encounter: Payer: Self-pay | Admitting: Neurology

## 2021-07-22 ENCOUNTER — Ambulatory Visit: Payer: Medicaid Other | Admitting: Neurology

## 2021-07-22 VITALS — BP 118/79 | HR 85 | Ht 64.0 in | Wt 176.4 lb

## 2021-07-22 DIAGNOSIS — R55 Syncope and collapse: Secondary | ICD-10-CM | POA: Diagnosis not present

## 2021-07-22 DIAGNOSIS — K219 Gastro-esophageal reflux disease without esophagitis: Secondary | ICD-10-CM

## 2021-07-22 MED ORDER — ESOMEPRAZOLE MAGNESIUM 20 MG PO CPDR: 20.0000 mg | DELAYED_RELEASE_CAPSULE | Freq: Every day | ORAL | 1 refills | Status: DC
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
## 2021-07-23 ENCOUNTER — Ambulatory Visit: Payer: Medicaid Other | Admitting: Physical Therapy

## 2021-07-23 ENCOUNTER — Other Ambulatory Visit: Payer: Self-pay | Admitting: Student

## 2021-07-23 DIAGNOSIS — M5417 Radiculopathy, lumbosacral region: Secondary | ICD-10-CM

## 2021-07-26 ENCOUNTER — Other Ambulatory Visit: Payer: Self-pay | Admitting: Physician Assistant

## 2021-07-26 DIAGNOSIS — R928 Other abnormal and inconclusive findings on diagnostic imaging of breast: Secondary | ICD-10-CM

## 2021-07-27 ENCOUNTER — Other Ambulatory Visit: Payer: Self-pay | Admitting: Student

## 2021-07-28 ENCOUNTER — Ambulatory Visit: Payer: Medicaid Other | Admitting: Physical Therapy

## 2021-07-30 ENCOUNTER — Ambulatory Visit: Payer: Medicaid Other | Admitting: Physical Therapy

## 2021-08-02 ENCOUNTER — Telehealth: Payer: Self-pay

## 2021-08-02 ENCOUNTER — Ambulatory Visit: Payer: Medicaid Other | Admitting: Neurology

## 2021-08-02 ENCOUNTER — Other Ambulatory Visit: Payer: Self-pay

## 2021-08-02 DIAGNOSIS — R55 Syncope and collapse: Secondary | ICD-10-CM

## 2021-08-02 NOTE — Procedures (Signed)
ELECTROENCEPHALOGRAM REPORT  Date of Study: 08/02/2021  Patient's Name: [name] MRN: [id number] Date of Birth: 16-Dec-1975  Referring Provider: Lerry Liner, DO  Clinical History: 45 year old female with COPD, Sjogren's and iron-deficiency anemia who presents for recurrent syncope since childhood, in the past associated with urinary incontinence and tongue biting.  Medications: CYMBALTA 60 MG capsule REMERON 15 MG tablet NEURONTIN 100 MG capsule CHANTIX 1 MG tablet PROAIR HFA 108 (90 Base) MCG/ACT inhaler LIPITOR 40 MG tablet NEXIUM 20 MG capsule FLONASE 50 MCG/ACT nasal spray MOBIC 7.5 MG tablet MULTIVITAMIN WITH MINERALS tablet SPIRIVA RESPIMAT 2.5 MCG/ACT AERS  vitamin E 200 UNIT capsule  Technical Summary: A multichannel digital EEG recording measured by the international 10-20 system with electrodes applied with paste and impedances below 5000 ohms performed in our laboratory with EKG monitoring in an awake and asleep patient.  Photic stimulation was performed.  The digital EEG was referentially recorded, reformatted, and digitally filtered in a variety of bipolar and referential montages for optimal display.    Description: The patient is awake and asleep during the recording.  During maximal wakefulness, there is a symmetric, medium voltage 11 Hz posterior dominant rhythm that attenuates with eye opening.  The record is symmetric.  During drowsiness and sleep, there is an increase in theta slowing of the background.  Stage 2 sleep was seen.  Photic stimulation did not elicit any abnormalities.  There were no epileptiform discharges or electrographic seizures seen.    EKG lead was unremarkable.  Impression: This awake and asleep EEG is normal.    Clinical Correlation: A normal EEG does not exclude a clinical diagnosis of epilepsy.  If further clinical questions remain, prolonged EEG may be helpful.  Clinical correlation is advised.   Shon Millet, DO

## 2021-08-02 NOTE — Telephone Encounter (Signed)
-----   Message from Vallarie Mare sent at 08/02/2021  3:29 PM EDT ----- Regarding: need order Will you please place an order for 72 hour ambulaory EEG?  She is scheduled just need an order.

## 2021-08-03 ENCOUNTER — Ambulatory Visit: Payer: Medicaid Other | Admitting: Physical Therapy

## 2021-08-04 ENCOUNTER — Ambulatory Visit: Payer: Medicaid Other | Admitting: Physical Therapy

## 2021-08-05 NOTE — Progress Notes (Signed)
LMOVM for pt to call the office.  ?

## 2021-08-10 ENCOUNTER — Ambulatory Visit: Payer: Medicaid Other

## 2021-08-10 ENCOUNTER — Ambulatory Visit
Admission: RE | Admit: 2021-08-10 | Discharge: 2021-08-10 | Disposition: A | Discharge status: Home / Self Care | Payer: Medicaid Other | Source: Ambulatory Visit | Attending: Physician Assistant | Admitting: Physician Assistant

## 2021-08-10 ENCOUNTER — Other Ambulatory Visit: Payer: Self-pay

## 2021-08-10 DIAGNOSIS — N6489 Other specified disorders of breast: Secondary | ICD-10-CM | POA: Diagnosis not present

## 2021-08-10 DIAGNOSIS — R922 Inconclusive mammogram: Secondary | ICD-10-CM | POA: Diagnosis not present

## 2021-08-10 DIAGNOSIS — R928 Other abnormal and inconclusive findings on diagnostic imaging of breast: Secondary | ICD-10-CM

## 2021-08-18 ENCOUNTER — Other Ambulatory Visit: Payer: Self-pay | Admitting: Internal Medicine

## 2021-08-23 ENCOUNTER — Ambulatory Visit: Payer: Medicaid Other | Admitting: Neurology

## 2021-08-23 ENCOUNTER — Other Ambulatory Visit: Payer: Self-pay

## 2021-08-23 DIAGNOSIS — R55 Syncope and collapse: Secondary | ICD-10-CM | POA: Diagnosis not present

## 2021-08-26 NOTE — Procedures (Signed)
ELECTROENCEPHALOGRAM REPORT  Dates of Recording: 08/23/2021 at 10:39 AM to 08/26/2021 at 12:12 PM  Patient's Name: [name] MRN: [id number] Date of Birth: Feb 27, 1976  Procedure: 72-hour ambulatory EEG  History: 45 year old female with recurrent episodes of syncope since childhood.  Medications: gabapentin, duloxetine, mirtazapine, Chantix  Technical Summary: This is a 72-hour multichannel digital EEG recording measured by the international 10-20 system with electrodes applied with paste and impedances below 5000 ohms performed as portable with EKG monitoring.  The digital EEG was referentially recorded, reformatted, and digitally filtered in a variety of bipolar and referential montages for optimal display.    DESCRIPTION OF RECORDING: During maximal wakefulness, the background activity consisted of a symmetric 10Hz  posterior dominant rhythm which was reactive to eye opening.  There were no epileptiform discharges or focal slowing seen in wakefulness.  During the recording, the patient progresses through wakefulness, drowsiness, and Stage 2 sleep.  Again, there were no epileptiform discharges seen.  Events:  There were no electrographic seizures seen.  EKG lead was unremarkable.  IMPRESSION: This 72-hour ambulatory EEG study is normal.    CLINICAL CORRELATION: A normal EEG does not exclude a clinical diagnosis of epilepsy.  If further clinical questions remain, inpatient video EEG monitoring may be helpful.   , DO

## 2021-08-31 ENCOUNTER — Telehealth: Payer: Self-pay | Admitting: Neurology

## 2021-08-31 NOTE — Telephone Encounter (Signed)
Pt said she is returning a call to someone, she thinks its about results

## 2021-08-31 NOTE — Progress Notes (Signed)
LMOVM for the patient to call the office back.  °

## 2021-09-01 NOTE — Telephone Encounter (Signed)
See results note. 

## 2021-09-01 NOTE — Progress Notes (Signed)
Pt advised pt of her EEG results.

## 2021-09-21 ENCOUNTER — Other Ambulatory Visit: Payer: Self-pay | Admitting: Internal Medicine

## 2021-09-21 DIAGNOSIS — K219 Gastro-esophageal reflux disease without esophagitis: Secondary | ICD-10-CM

## 2021-10-08 IMAGING — DX DG TIBIA/FIBULA 2V*R*
3 series · 3 of 3 positions shown · non-contrast
Comparison: None.

CLINICAL DATA: Dog bite.

EXAM:
RIGHT TIBIA AND FIBULA - 2 VIEW

[tibia ap (1 of 2)]
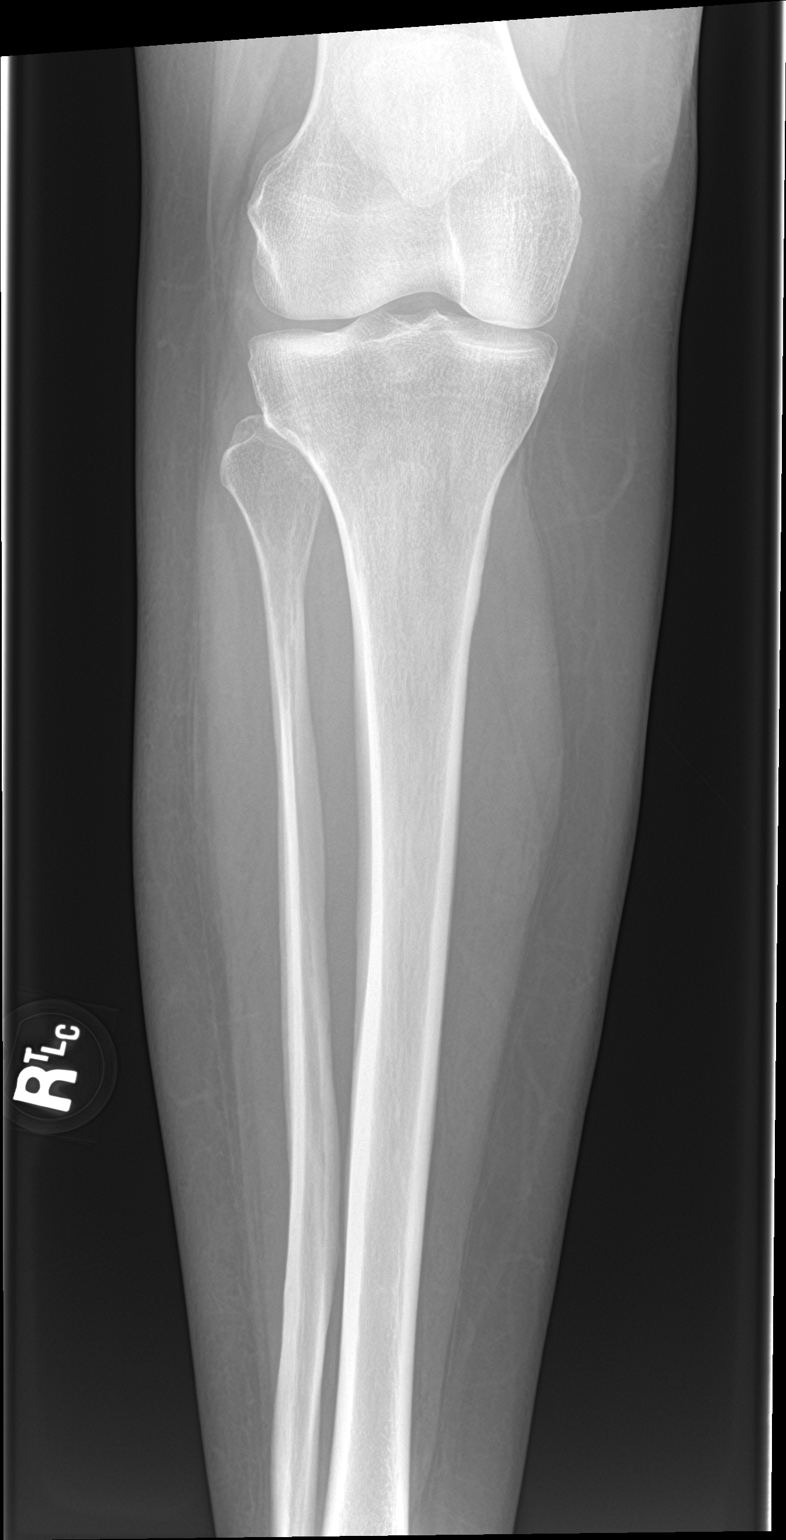

[tibia ap (2 of 2)]
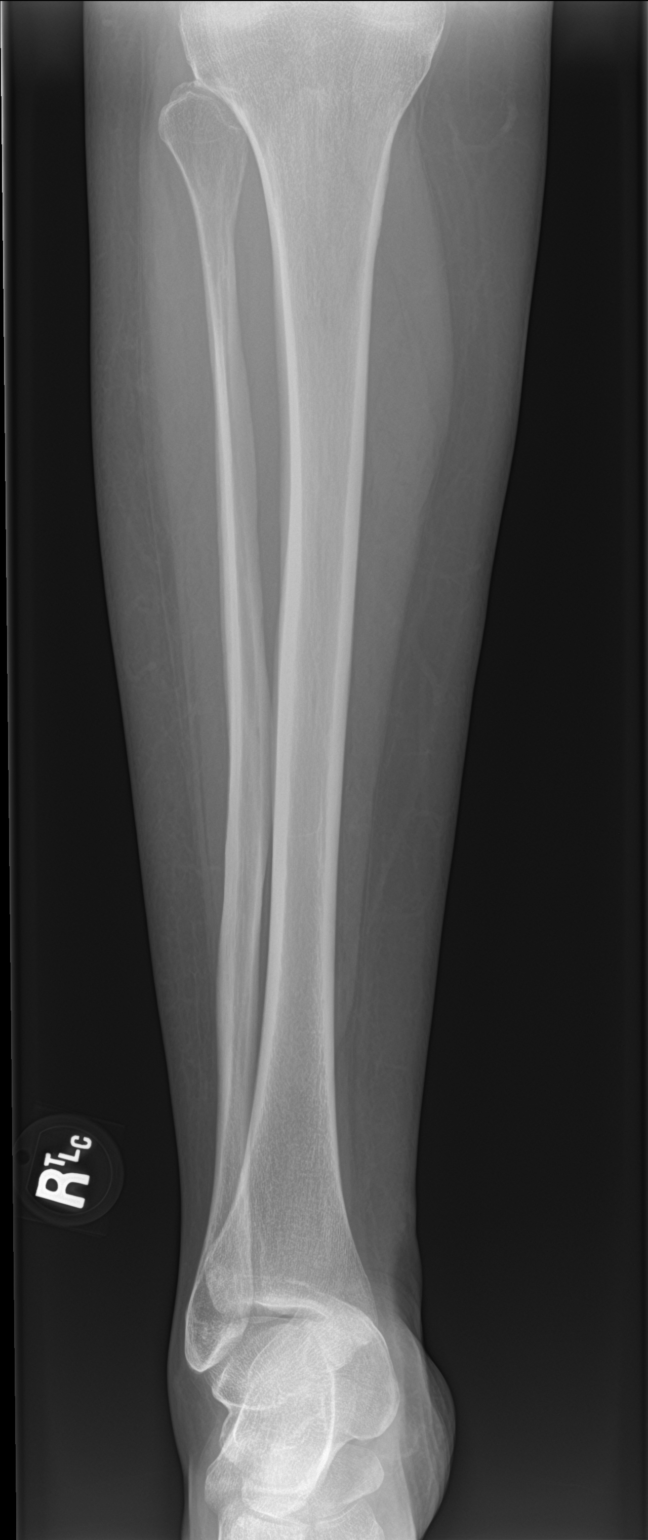

[tibia lat]
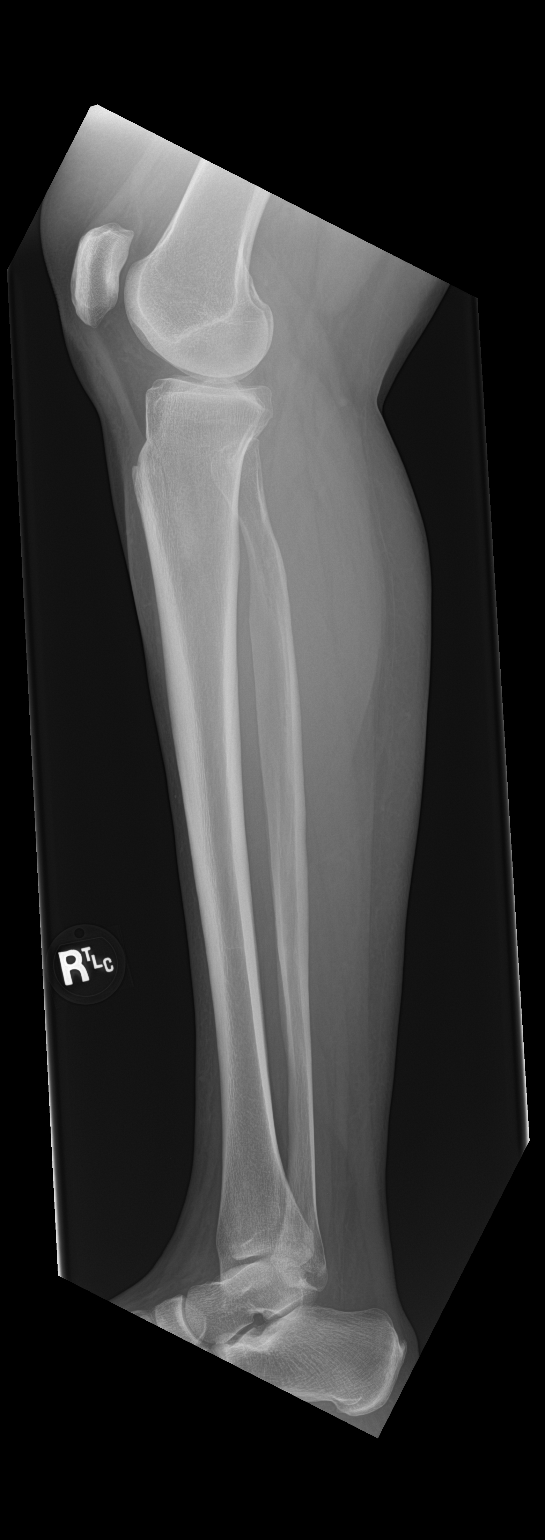

[3 of 3 positions shown; findings below may reference images not displayed]

FINDINGS: There is no evidence of fracture or other focal bone lesions. Soft
tissues are unremarkable.
IMPRESSION: Negative.

## 2021-10-18 ENCOUNTER — Other Ambulatory Visit: Payer: Self-pay | Admitting: Internal Medicine

## 2021-10-18 DIAGNOSIS — M5417 Radiculopathy, lumbosacral region: Secondary | ICD-10-CM

## 2021-10-27 ENCOUNTER — Encounter: Payer: Self-pay | Admitting: Neurology

## 2021-11-13 ENCOUNTER — Other Ambulatory Visit: Payer: Self-pay | Admitting: Internal Medicine

## 2021-11-16 ENCOUNTER — Encounter: Payer: Medicaid Other | Admitting: Internal Medicine

## 2021-11-17 ENCOUNTER — Other Ambulatory Visit: Payer: Self-pay | Admitting: Internal Medicine

## 2021-11-17 DIAGNOSIS — K219 Gastro-esophageal reflux disease without esophagitis: Secondary | ICD-10-CM

## 2021-11-27 ENCOUNTER — Other Ambulatory Visit: Payer: Self-pay | Admitting: Student

## 2021-11-27 DIAGNOSIS — K219 Gastro-esophageal reflux disease without esophagitis: Secondary | ICD-10-CM
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
## 2022-01-28 ENCOUNTER — Ambulatory Visit: Payer: Medicaid Other | Admitting: Neurology

## 2022-01-28 ENCOUNTER — Other Ambulatory Visit (INDEPENDENT_AMBULATORY_CARE_PROVIDER_SITE_OTHER): Payer: Medicaid Other

## 2022-01-28 ENCOUNTER — Encounter: Payer: Self-pay | Admitting: Neurology

## 2022-01-28 VITALS — BP 109/61 | HR 95 | Ht 64.0 in | Wt 196.6 lb

## 2022-01-28 DIAGNOSIS — Z79899 Other long term (current) drug therapy: Secondary | ICD-10-CM

## 2022-01-28 DIAGNOSIS — R55 Syncope and collapse: Secondary | ICD-10-CM

## 2022-01-28 LAB — COMPREHENSIVE METABOLIC PANEL
ALT: 20 U/L (ref 0–35)
AST: 19 U/L (ref 0–37)
Albumin: 3.8 g/dL (ref 3.5–5.2)
Alkaline Phosphatase: 114 U/L (ref 39–117)
BUN: 13 mg/dL (ref 6–23)
CO2: 29 mEq/L (ref 19–32)
Calcium: 8.4 mg/dL (ref 8.4–10.5)
Chloride: 103 mEq/L (ref 96–112)
Creatinine, Ser: 0.56 mg/dL (ref 0.40–1.20)
GFR: 110.7 mL/min (ref >60.00)
Glucose, Bld: 109 mg/dL — ABNORMAL HIGH (ref 70–99)
Potassium: 4.5 mEq/L (ref 3.5–5.1)
Sodium: 136 mEq/L (ref 135–145)
Total Bilirubin: 0.3 mg/dL (ref 0.2–1.2)
Total Protein: 6 g/dL (ref 6.0–8.3)

## 2022-01-28 MED ORDER — LAMOTRIGINE 25 MG PO TABS: ORAL_TABLET | ORAL | 0 refills | Status: DC

## 2022-01-28 NOTE — Patient Instructions (Addendum)
Start lamotrigine 25mg  tablet: ?  AM   PM ?Week 1&2    25mg  (1 tab) ?Week 3&4 25mg  (1 tab)  25mg  (1 tab) ?Week 5 50mg  (2 tabs)  50mg  (2 tabs) ? ?Call if you develop any new unusual rash. ? ?2.  Check CBC and CMP ?3.  Follow up 6 months. ?4. Reorder MRI Brain W/WO Contrast ?

## 2022-01-29 LAB — CBC WITH DIFFERENTIAL
Basophils Absolute: 0.1 10*3/uL (ref 0.0–0.2)
Basos: 1 %
EOS (ABSOLUTE): 0.3 10*3/uL (ref 0.0–0.4)
Eos: 4 %
Hematocrit: 41.8 % (ref 34.0–46.6)
Hemoglobin: 13.3 g/dL (ref 11.1–15.9)
Immature Grans (Abs): 0 10*3/uL (ref 0.0–0.1)
Immature Granulocytes: 0 %
Lymphocytes Absolute: 1.3 10*3/uL (ref 0.7–3.1)
Lymphs: 19 %
MCH: 28.3 pg (ref 26.6–33.0)
MCHC: 31.8 g/dL (ref 31.5–35.7)
MCV: 89 fL (ref 79–97)
Monocytes Absolute: 0.5 10*3/uL (ref 0.1–0.9)
Monocytes: 6 %
Neutrophils Absolute: 4.8 10*3/uL (ref 1.4–7.0)
Neutrophils: 70 %
RBC: 4.7 x10E6/uL (ref 3.77–5.28)
RDW: 14.2 % (ref 11.7–15.4)
WBC: 7 10*3/uL (ref 3.4–10.8)

## 2022-02-04 ENCOUNTER — Ambulatory Visit: Payer: Medicaid Other | Admitting: Neurology

## 2022-02-04 IMAGING — MG MM DIGITAL DIAGNOSTIC UNILAT*L* W/ TOMO W/ CAD
6 series · 6 of 18 positions shown · non-contrast
Comparison: Previous exam(s).

CLINICAL DATA: Screening recall for a left breast asymmetry.

EXAM:
DIGITAL DIAGNOSTIC UNILATERAL LEFT MAMMOGRAM WITH TOMOSYNTHESIS AND
CAD
TECHNIQUE: Left digital diagnostic mammography and breast tomosynthesis was
performed. The images were evaluated with computer-aided detection.

[L MLO synth-2D (1 of 2)]
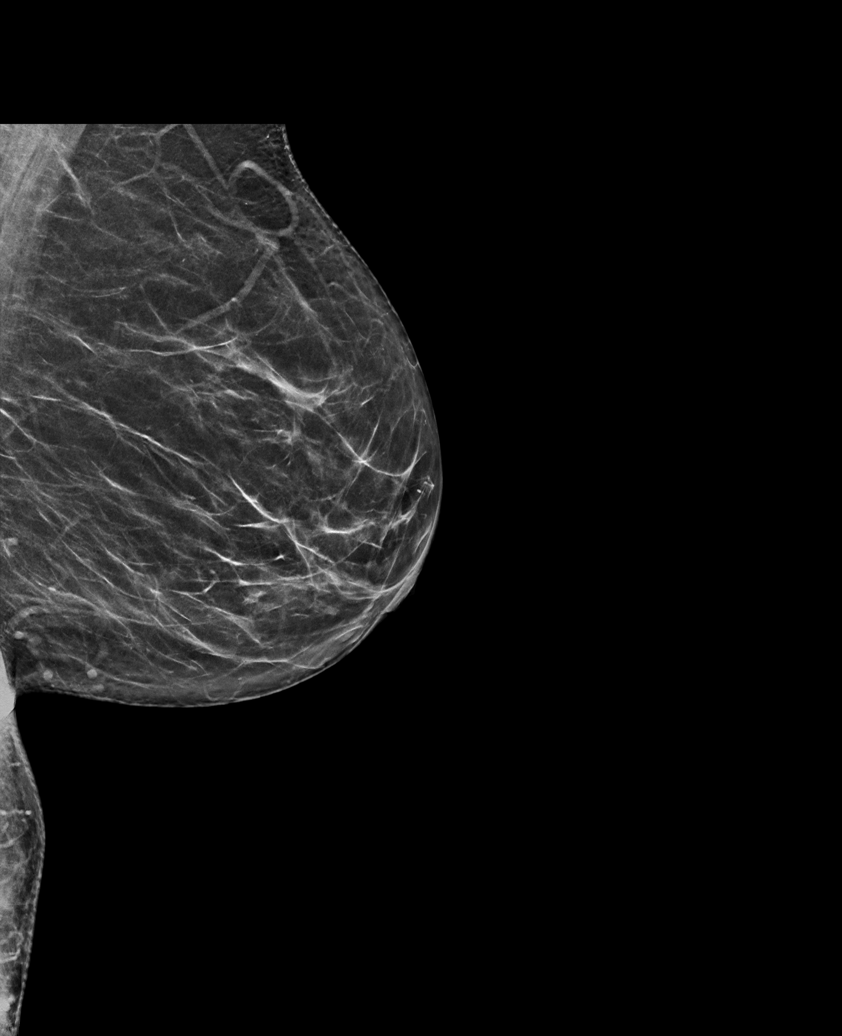

[L ML synth-2D]
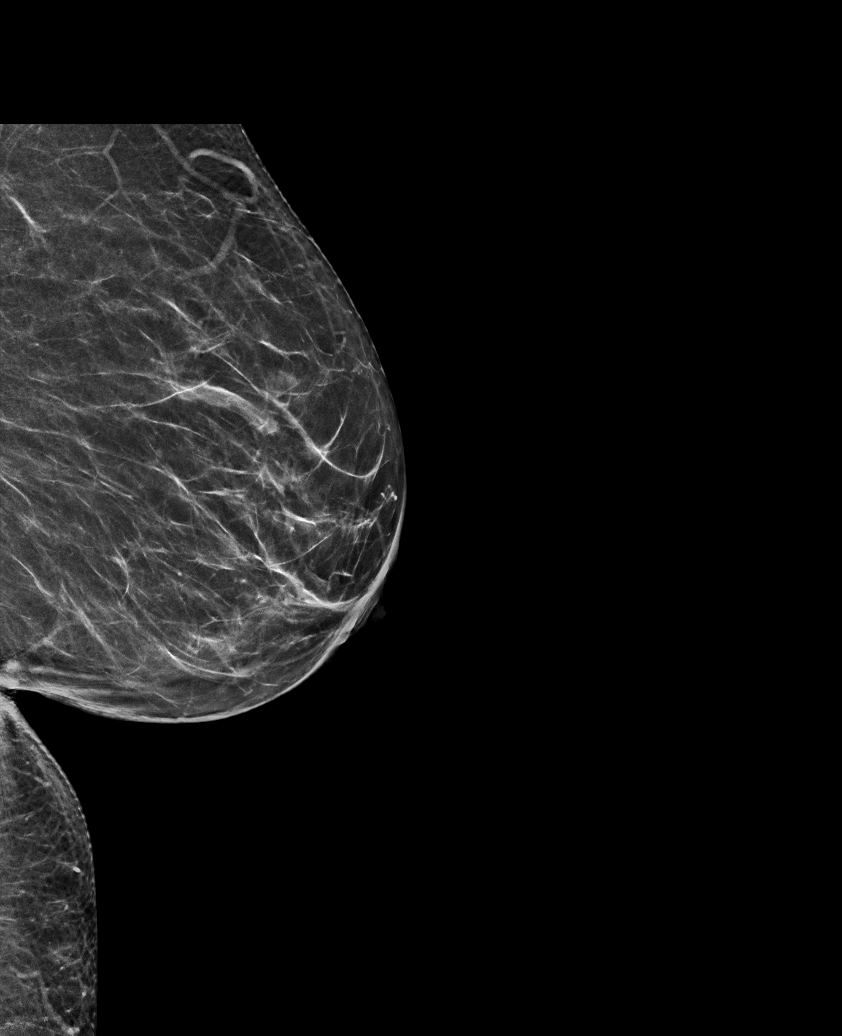

[L MLO synth-2D (2 of 2)]
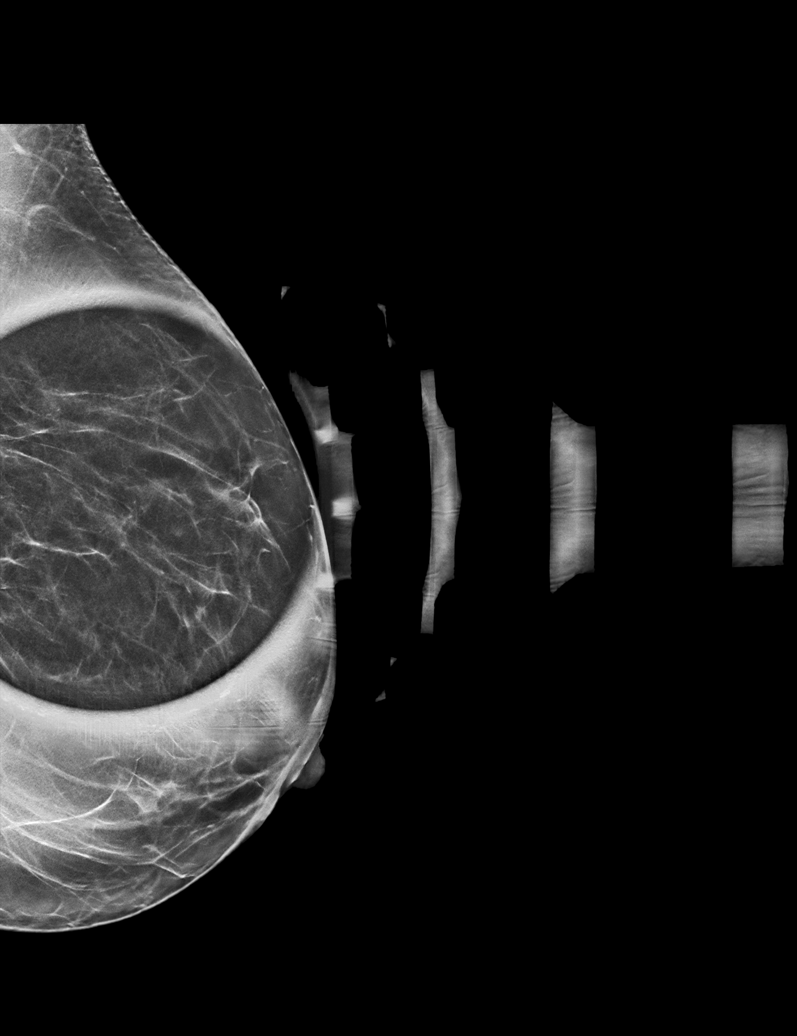

[L ML tomo · tomo slice 33/65.0]
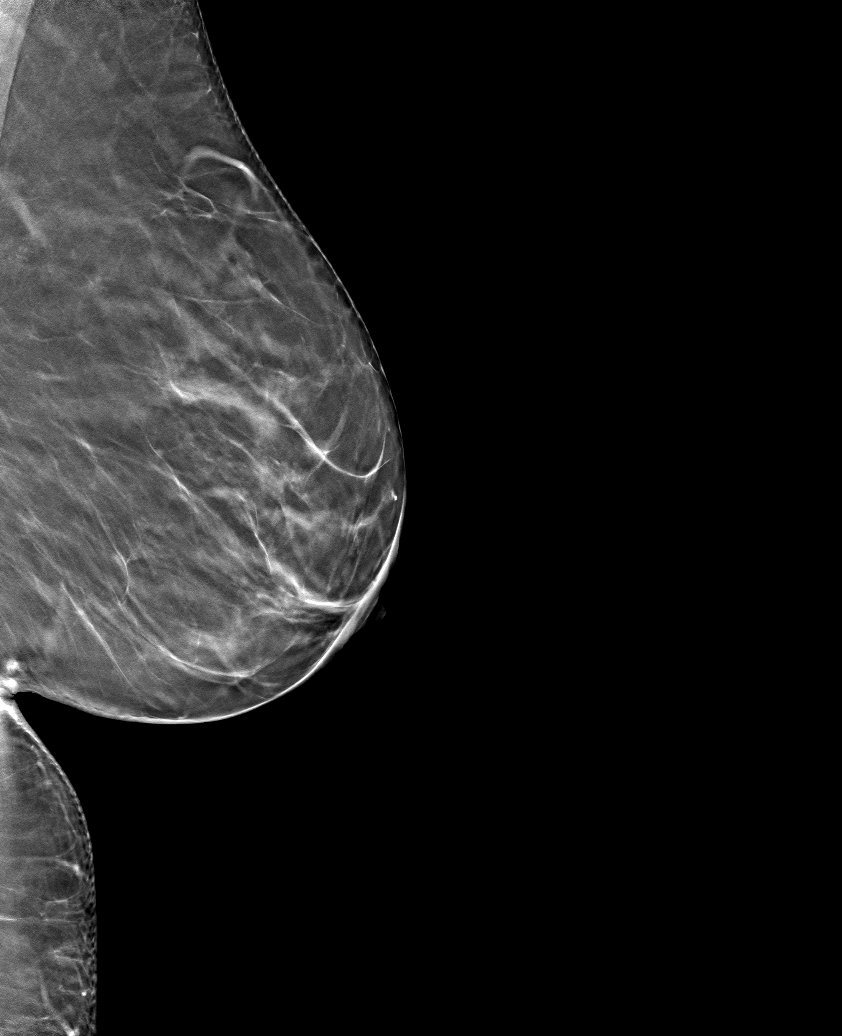

[L MLO tomo (1 of 2) · tomo slice 28/55.0]
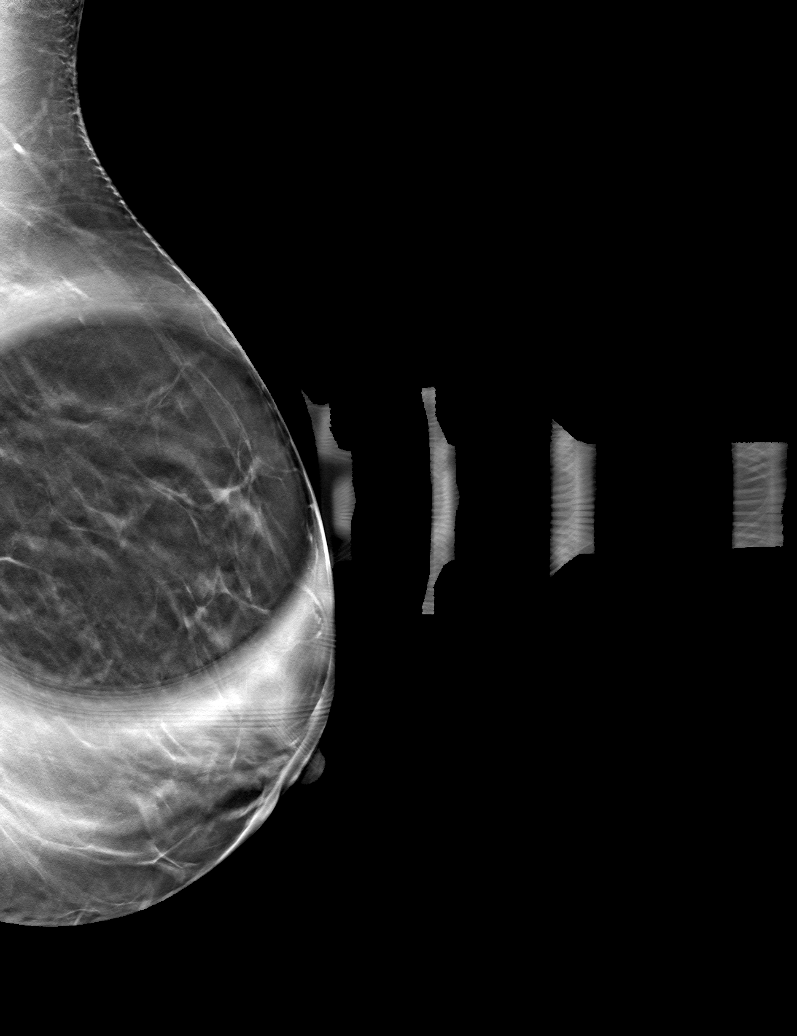

[L MLO tomo (2 of 2) · tomo slice 33/66.0]
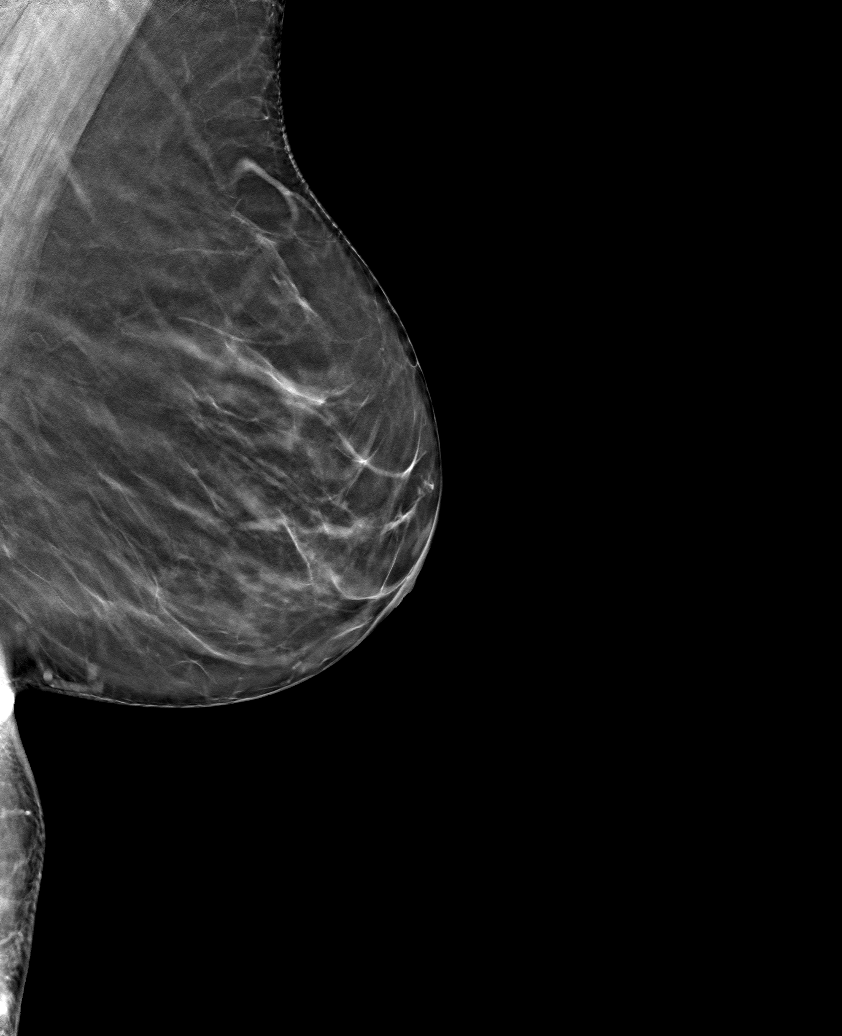

[6 of 18 positions shown; findings below may reference images not displayed]

ACR Breast Density Category b: There are scattered areas of
fibroglandular density.
FINDINGS: Spot compression tomosynthesis images through the asymmetry in the
superior mid to anterior depth of the left breast demonstrates no
persistent suspicious asymmetries or masses. No suspicious
calcifications, masses or areas of distortion are seen in the left
breast.
IMPRESSION: Resolution of the left breast asymmetry consistent with overlapping
fibroglandular tissue.

RECOMMENDATION:
Screening mammogram in one year.(Code:[id number])

I have discussed the findings and recommendations with the patient.
If applicable, a reminder letter will be sent to the patient
regarding the next appointment.

BI-RADS CATEGORY  1: Negative.

## 2022-02-17 ENCOUNTER — Ambulatory Visit
Admission: RE | Admit: 2022-02-17 | Discharge: 2022-02-17 | Disposition: A | Discharge status: Home / Self Care | Payer: Medicaid Other | Source: Ambulatory Visit | Attending: Neurology | Admitting: Neurology

## 2022-02-17 DIAGNOSIS — R55 Syncope and collapse: Secondary | ICD-10-CM

## 2022-02-17 MED ORDER — GADOBENATE DIMEGLUMINE 529 MG/ML IV SOLN
17.0000 mL | Freq: Once | INTRAVENOUS | Status: AC | PRN
  Administered 2022-02-17: 17 mL via INTRAVENOUS

## 2022-03-11 ENCOUNTER — Other Ambulatory Visit: Payer: Self-pay | Admitting: Neurology

## 2022-03-23 ENCOUNTER — Other Ambulatory Visit: Payer: Self-pay | Admitting: Pain Medicine

## 2022-03-23 DIAGNOSIS — M5116 Intervertebral disc disorders with radiculopathy, lumbar region: Secondary | ICD-10-CM

## 2022-04-03 ENCOUNTER — Other Ambulatory Visit: Payer: Medicaid Other

## 2022-04-14 ENCOUNTER — Ambulatory Visit
Admission: RE | Admit: 2022-04-14 | Discharge: 2022-04-14 | Disposition: A | Discharge status: Home / Self Care | Payer: Medicaid Other | Source: Ambulatory Visit | Attending: Pain Medicine | Admitting: Pain Medicine

## 2022-04-14 DIAGNOSIS — M5116 Intervertebral disc disorders with radiculopathy, lumbar region: Secondary | ICD-10-CM
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
## 2022-08-01 ENCOUNTER — Encounter: Payer: Self-pay | Admitting: Neurology

## 2022-08-01 ENCOUNTER — Ambulatory Visit: Payer: Medicaid Other | Admitting: Neurology

## 2022-08-01 DIAGNOSIS — Z029 Encounter for administrative examinations, unspecified: Secondary | ICD-10-CM

## 2022-08-10 ENCOUNTER — Other Ambulatory Visit: Payer: Self-pay | Admitting: Neurology

## 2022-08-18 ENCOUNTER — Emergency Department (HOSPITAL_COMMUNITY)
Admission: EM | Admit: 2022-08-18 | Discharge: 2022-08-19 | Disposition: A | Discharge status: Home / Self Care | Payer: Medicaid Other | Attending: Emergency Medicine | Admitting: Emergency Medicine

## 2022-08-18 ENCOUNTER — Encounter (HOSPITAL_COMMUNITY): Payer: Self-pay

## 2022-08-18 DIAGNOSIS — D72829 Elevated white blood cell count, unspecified: Secondary | ICD-10-CM | POA: Insufficient documentation

## 2022-08-18 DIAGNOSIS — Z20822 Contact with and (suspected) exposure to covid-19: Secondary | ICD-10-CM | POA: Insufficient documentation

## 2022-08-18 DIAGNOSIS — T40414A Poisoning by fentanyl or fentanyl analogs, undetermined, initial encounter: Secondary | ICD-10-CM | POA: Diagnosis present

## 2022-08-18 DIAGNOSIS — Z7951 Long term (current) use of inhaled steroids: Secondary | ICD-10-CM | POA: Diagnosis not present

## 2022-08-18 DIAGNOSIS — T50901A Poisoning by unspecified drugs, medicaments and biological substances, accidental (unintentional), initial encounter: Secondary | ICD-10-CM

## 2022-08-18 DIAGNOSIS — J449 Chronic obstructive pulmonary disease, unspecified: Secondary | ICD-10-CM | POA: Insufficient documentation

## 2022-08-18 LAB — CBC WITH DIFFERENTIAL/PLATELET
Abs Immature Granulocytes: 0.08 10*3/uL — ABNORMAL HIGH (ref 0.00–0.07)
Basophils Absolute: 0.1 10*3/uL (ref 0.0–0.1)
Basophils Relative: 0 %
Eosinophils Absolute: 0.1 10*3/uL (ref 0.0–0.5)
Eosinophils Relative: 0 %
HCT: 44.8 % (ref 36.0–46.0)
Hemoglobin: 14.3 g/dL (ref 12.0–15.0)
Immature Granulocytes: 1 %
Lymphocytes Relative: 7 %
Lymphs Abs: 1 10*3/uL (ref 0.7–4.0)
MCH: 29.1 pg (ref 26.0–34.0)
MCHC: 31.9 g/dL (ref 30.0–36.0)
MCV: 91.2 fL (ref 80.0–100.0)
Monocytes Absolute: 0.7 10*3/uL (ref 0.1–1.0)
Monocytes Relative: 5 %
Neutro Abs: 12.2 10*3/uL — ABNORMAL HIGH (ref 1.7–7.7)
Neutrophils Relative %: 87 %
Platelets: 273 10*3/uL (ref 150–400)
RBC: 4.91 MIL/uL (ref 3.87–5.11)
RDW: 14.7 % (ref 11.5–15.5)
WBC: 14 10*3/uL — ABNORMAL HIGH (ref 4.0–10.5)
nRBC: 0 % (ref 0.0–0.2)

## 2022-08-18 LAB — SALICYLATE LEVEL: Salicylate Lvl: <7 mg/dL — ABNORMAL LOW (ref 7.0–30.0)

## 2022-08-18 LAB — ETHANOL: Alcohol, Ethyl (B): <10 mg/dL (ref <10)

## 2022-08-18 LAB — I-STAT BETA HCG BLOOD, ED (MC, WL, AP ONLY): I-stat hCG, quantitative: <5 m[IU]/mL (ref <5)

## 2022-08-18 LAB — ACETAMINOPHEN LEVEL: Acetaminophen (Tylenol), Serum: <10 ug/mL — ABNORMAL LOW (ref 10–30)

## 2022-08-18 MED ORDER — ACETAMINOPHEN 500 MG PO TABS
1000.0000 mg | ORAL_TABLET | Freq: Once | ORAL | Status: AC
  Administered 2022-08-18: 1000 mg via ORAL
  Filled 2022-08-18: qty 2
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
## 2022-08-18 NOTE — ED Provider Notes (Signed)
[facility] DEPT Provider Note   CSN: [id number] Arrival date & time: 08/18/22  2035     History  Chief Complaint  Patient presents with   Drug Overdose    [name] is a 46 y.o. female.  The history is provided by the patient and medical records.  Drug Overdose   46 y.o. F with history of COPD, syncope, fibromyalgia, hyperlipidemia, sjogren's syndrome, presenting to the ED after drug overdose.  Sister reports she and ex-husband were at a hotel together today, were staying there as they have been visiting their brother.  Sister reports by the time she got to the hotel she was unresponsive, not breathing, and began turning blue.  EMS was called and administered 1 mg intranasal Narcan with good improvement.  Reportedly, she and husband are separated but considered reconciling.  Apparently he showed her a photo of another woman that he is apparently seeing which upset her.  Sister reports she has been taking this very hard and has seemed depressed.  Mother was on scene earlier and admitted that patient had made some SI statements.  Sister also has concerns that this OD was intentional.  Patient admits to using fentanyl today x2, denies other co-ingestion.    Home Medications Prior to Admission medications   Medication Sig Start Date End Date Taking? Authorizing Provider  albuterol (PROAIR HFA) 108 (90 Base) MCG/ACT inhaler INHALE 1 TO 2 PUFFS INTO LUNGS EVERY 6 HOURS AS NEEDED FOR WHEEZING OR SHORTNESS OF BREATH 03/01/21   Lyndal Pulley, MD  atorvastatin (LIPITOR) 40 MG tablet TAKE 1 TABLET(40 MG) BY MOUTH DAILY 04/23/21   Sanjuan Dame, MD  DULoxetine (CYMBALTA) 60 MG capsule TAKE 1 CAPSULE(60 MG) BY MOUTH DAILY 03/16/21   Andrew Au, MD  fluticasone [facility]) 50 MCG/ACT nasal spray instill 2 sprays into each nostril once daily 12/23/20   Seawell, Jaimie A, DO  gabapentin (NEURONTIN) 400 MG capsule Take 400 mg by mouth 3 (three) times daily. 01/13/22    [provider]  lamoTRIgine (LAMICTAL) 25 MG tablet TAKE 2 TABLETS BY MOUTH TWICE DAILY 03/11/22   Pieter Partridge, DO  meclizine (ANTIVERT) 25 MG tablet Take 25 mg by mouth 2 (two) times daily as needed. 06/22/21   [provider]  meloxicam (MOBIC) 7.5 MG tablet TAKE 1 TABLET(7.5 MG) BY MOUTH DAILY 09/22/21   Rehman, Areeg N, DO  mirtazapine (REMERON) 15 MG tablet TAKE 1 TABLET(15 MG) BY MOUTH AT BEDTIME 07/28/21   Rehman, Areeg N, DO  Multiple Vitamins-Minerals (MULTIVITAMIN WITH MINERALS) tablet Take 1 tablet by mouth daily.    [provider]  sulindac (CLINORIL) 200 MG tablet Take 200 mg by mouth 2 (two) times daily. 01/25/22   [provider]  Tiotropium Bromide Monohydrate (SPIRIVA RESPIMAT) 2.5 MCG/ACT AERS Inhale 2 puffs into the lungs daily. 03/18/21   Alexandria Lodge, MD  tiZANidine (ZANAFLEX) 2 MG tablet Take by mouth. 01/25/22   [provider]  varenicline (CHANTIX) 1 MG tablet FOLLOW INSTRUCTIONS ON SEPARATE PIECE OF PAPER 02/12/21   Rehman, Areeg N, DO      Allergies    Patient has no known allergies.    Review of Systems   Review of Systems  Psychiatric/Behavioral:         Drug OD  All other systems reviewed and are negative.   Physical Exam Updated Vital Signs BP 133/82   Pulse 89   Temp 98 F (36.7 C)   Resp 12  Ht 5\' 4"  (1.626 m)   Wt 86.2 kg   SpO2 94%   BMI 32.61 kg/m  Physical Exam Vitals and nursing note reviewed.  Constitutional:      Appearance: She is well-developed.     Comments: Disheveled appearing, drowsy but arouseable  HENT:     Head: Normocephalic and atraumatic.  Eyes:     Conjunctiva/sclera: Conjunctivae normal.     Pupils: Pupils are equal, round, and reactive to light.  Cardiovascular:     Rate and Rhythm: Normal rate and regular rhythm.     Heart sounds: Normal heart sounds.  Pulmonary:     Effort: Pulmonary effort is normal.     Breath sounds: Normal breath sounds.  Abdominal:     General:  Bowel sounds are normal.     Palpations: Abdomen is soft.  Musculoskeletal:        General: Normal range of motion.     Cervical back: Normal range of motion.  Skin:    General: Skin is warm and dry.  Neurological:     Mental Status: She is alert and oriented to person, place, and time.     Comments: Drowsy but once awoken is AAOx3, able to answer questions and follow commands, moving extremities well, no focal deficits     ED Results / Procedures / Treatments   Labs (all labs ordered are listed, but only abnormal results are displayed) Labs Reviewed  CBC WITH DIFFERENTIAL/PLATELET - Abnormal; Notable for the following components:      Result Value   WBC 14.0 (*)    Neutro Abs 12.2 (*)    Abs Immature Granulocytes 0.08 (*)    All other components within normal limits  RAPID URINE DRUG SCREEN, HOSP PERFORMED - Abnormal; Notable for the following components:   Opiates POSITIVE (*)    All other components within normal limits  SALICYLATE LEVEL - Abnormal; Notable for the following components:   Salicylate Lvl <7.0 (*)    All other components within normal limits  ACETAMINOPHEN LEVEL - Abnormal; Notable for the following components:   Acetaminophen (Tylenol), Serum <10 (*)    All other components within normal limits  COMPREHENSIVE METABOLIC PANEL - Abnormal; Notable for the following components:   Glucose, Bld 132 (*)    BUN 27 (*)    Calcium 8.8 (*)    AST 151 (*)    ALT 165 (*)    All other components within normal limits  RESP PANEL BY RT-PCR (FLU A&B, COVID) ARPGX2  ETHANOL  I-STAT BETA HCG BLOOD, ED ([facility], [facility], [facility] ONLY)    EKG None  Radiology No results found.  Procedures Procedures    Medications Ordered in ED Medications  acetaminophen (TYLENOL) tablet 1,000 mg (1,000 mg Oral Given 08/18/22 2301)    ED Course/ Medical Decision Making/ A&P                           Medical Decision Making Amount and/or Complexity of Data Reviewed Labs:  ordered. ECG/medicine tests: ordered and independent interpretation performed.  Risk OTC drugs.   46 year old female presenting to the ED after drug overdose.  She was apparently using fentanyl in hotel room with ex-husband.  Mother reports she expressed some SI earlier in the day.  Sister present here in the ED, admits she has been having a tough time dealing with her separation and also has concerns of the OD was intentional.  Patient is drowsy but  arousable here, awake and alert once awoken.  Will check labs, EKG.  Will monitor.  Labs as above--slight leukocytosis.  No other infectious symptoms such as fever.  Does have transaminitis.  Denies any recent alcohol use.  Ethanol is negative here along with Tylenol level.  She has no current abdominal pain.  Patient has been monitored here in the ED for several hours.  She has not required any further Narcan.  Feel she can be medically cleared.  Given statements made earlier on scene regarding SI and sisters concern for possible intentional overdose, will obtain TTS consult.  Final Clinical Impression(s) / ED Diagnoses Final diagnoses:  Overdose of undetermined intent, initial encounter    Rx / DC Orders ED Discharge Orders     None         Garlon Hatchet, PA-C 08/19/22 7741    Glyn Ade, MD 08/19/22 1558

## 2022-08-19 LAB — COMPREHENSIVE METABOLIC PANEL
ALT: 165 U/L — ABNORMAL HIGH (ref 0–44)
AST: 151 U/L — ABNORMAL HIGH (ref 15–41)
Albumin: 4.1 g/dL (ref 3.5–5.0)
Alkaline Phosphatase: 84 U/L (ref 38–126)
Anion gap: 8 (ref 5–15)
BUN: 27 mg/dL — ABNORMAL HIGH (ref 6–20)
CO2: 22 mmol/L (ref 22–32)
Calcium: 8.8 mg/dL — ABNORMAL LOW (ref 8.9–10.3)
Chloride: 110 mmol/L (ref 98–111)
Creatinine, Ser: 0.68 mg/dL (ref 0.44–1.00)
GFR, Estimated: >60 mL/min (ref >60)
Glucose, Bld: 132 mg/dL — ABNORMAL HIGH (ref 70–99)
Potassium: 4.1 mmol/L (ref 3.5–5.1)
Sodium: 140 mmol/L (ref 135–145)
Total Bilirubin: 0.8 mg/dL (ref 0.3–1.2)
Total Protein: 6.9 g/dL (ref 6.5–8.1)

## 2022-08-19 LAB — RESP PANEL BY RT-PCR (FLU A&B, COVID) ARPGX2
Influenza A by PCR: NEGATIVE
Influenza B by PCR: NEGATIVE
SARS Coronavirus 2 by RT PCR: NEGATIVE

## 2022-08-19 LAB — RAPID URINE DRUG SCREEN, HOSP PERFORMED
Amphetamines: NOT DETECTED
Barbiturates: NOT DETECTED
Benzodiazepines: NOT DETECTED
Cocaine: NOT DETECTED
Opiates: POSITIVE — AB
Tetrahydrocannabinol: NOT DETECTED

## 2022-08-19 NOTE — ED Notes (Signed)
Pt belongings placed in cupboard for 16-18.

## 2022-08-19 NOTE — ED Notes (Signed)
Pt changed into maroon scrubs.  

## 2022-08-19 NOTE — ED Provider Notes (Signed)
Emergency Medicine Observation Re-evaluation Note  [name] is a 46 y.o. female, seen on rounds today.  Pt initially presented to the ED for complaints of Drug Overdose Currently, the patient is awaiting psych assessment.  Physical Exam  BP 110/66   Pulse 87   Temp 98.5 F (36.9 C) (Oral)   Resp 18   Ht 1.626 m (5\' 4" )   Wt 86.2 kg   SpO2 96%   BMI 32.61 kg/m  Physical Exam General: wdwn Cardiac: rrr Lungs: rr and sats normal Psych: Awake and alert.  She denies suicidal intention.  States I love myself too much for that  ED Course / MDM  EKG:EKG Interpretation  Date/Time:  Thursday August 18 2022 20:47:43 EDT Ventricular Rate:  95 PR Interval:  146 QRS Duration: 74 QT Interval:  456 QTC Calculation: 574 R Axis:   68 Text Interpretation: Sinus rhythm Borderline T abnormalities, anterior leads Prolonged QT interval When compared with ECG of 07/23/2020, QT has lengthened Confirmed by Delora Fuel ([id number]) on 08/19/2022 3:45:40 AM  I have reviewed the labs performed to date as well as medications administered while in observation.  Recent changes in the last 24 hours include patient seen and evaluated for overdose. She is back to baseline. At the scene, there was some concern from family members that this has been intentional I have discussed this with the patient.  She absolutely denies any suicidal intent.  She states she would never harm herself. I spoke again with her mother.  Her mother feels that she is comfortable having her come home.  She will be coming to get her..  Plan  Current plan is for discharge.    [name], MD 08/19/22 0930

## 2022-08-19 NOTE — Discharge Instructions (Signed)
You were treated here for a narcotic overdose. Please use the resource guide to find distance with your substance abuse Return if you are having any new problems patient reevaluation

## 2022-11-02 ENCOUNTER — Encounter (HOSPITAL_COMMUNITY): Payer: Self-pay | Admitting: Emergency Medicine

## 2022-11-02 ENCOUNTER — Emergency Department (HOSPITAL_COMMUNITY): Payer: Medicaid Other

## 2022-11-02 ENCOUNTER — Other Ambulatory Visit: Payer: Self-pay

## 2022-11-02 ENCOUNTER — Inpatient Hospital Stay (HOSPITAL_COMMUNITY)
Admission: EM | Admit: 2022-11-02 | Discharge: 2022-11-05 | DRG: 194 | Disposition: A | Discharge status: Home / Self Care | Payer: Medicaid Other | Attending: Student | Admitting: Student

## 2022-11-02 DIAGNOSIS — R0602 Shortness of breath: Secondary | ICD-10-CM

## 2022-11-02 DIAGNOSIS — J9809 Other diseases of bronchus, not elsewhere classified: Secondary | ICD-10-CM | POA: Diagnosis present

## 2022-11-02 DIAGNOSIS — J440 Chronic obstructive pulmonary disease with acute lower respiratory infection: Secondary | ICD-10-CM | POA: Diagnosis present

## 2022-11-02 DIAGNOSIS — E872 Acidosis, unspecified: Secondary | ICD-10-CM | POA: Diagnosis present

## 2022-11-02 DIAGNOSIS — G8929 Other chronic pain: Secondary | ICD-10-CM | POA: Diagnosis present

## 2022-11-02 DIAGNOSIS — Z79899 Other long term (current) drug therapy: Secondary | ICD-10-CM

## 2022-11-02 DIAGNOSIS — A419 Sepsis, unspecified organism: Secondary | ICD-10-CM | POA: Diagnosis present

## 2022-11-02 DIAGNOSIS — R042 Hemoptysis: Secondary | ICD-10-CM | POA: Insufficient documentation

## 2022-11-02 DIAGNOSIS — D72819 Decreased white blood cell count, unspecified: Secondary | ICD-10-CM | POA: Diagnosis present

## 2022-11-02 DIAGNOSIS — R7989 Other specified abnormal findings of blood chemistry: Secondary | ICD-10-CM | POA: Diagnosis not present

## 2022-11-02 DIAGNOSIS — Z86001 Personal history of in-situ neoplasm of cervix uteri: Secondary | ICD-10-CM

## 2022-11-02 DIAGNOSIS — Z888 Allergy status to other drugs, medicaments and biological substances status: Secondary | ICD-10-CM

## 2022-11-02 DIAGNOSIS — J189 Pneumonia, unspecified organism: Principal | ICD-10-CM | POA: Diagnosis present

## 2022-11-02 DIAGNOSIS — M3500 Sicca syndrome, unspecified: Secondary | ICD-10-CM | POA: Diagnosis present

## 2022-11-02 DIAGNOSIS — G40909 Epilepsy, unspecified, not intractable, without status epilepticus: Secondary | ICD-10-CM | POA: Diagnosis present

## 2022-11-02 DIAGNOSIS — I10 Essential (primary) hypertension: Secondary | ICD-10-CM | POA: Diagnosis present

## 2022-11-02 DIAGNOSIS — J984 Other disorders of lung: Secondary | ICD-10-CM | POA: Diagnosis present

## 2022-11-02 DIAGNOSIS — J449 Chronic obstructive pulmonary disease, unspecified: Secondary | ICD-10-CM | POA: Diagnosis present

## 2022-11-02 DIAGNOSIS — E669 Obesity, unspecified: Secondary | ICD-10-CM | POA: Diagnosis present

## 2022-11-02 DIAGNOSIS — Z1152 Encounter for screening for COVID-19: Secondary | ICD-10-CM

## 2022-11-02 DIAGNOSIS — J398 Other specified diseases of upper respiratory tract: Secondary | ICD-10-CM | POA: Insufficient documentation

## 2022-11-02 DIAGNOSIS — F17210 Nicotine dependence, cigarettes, uncomplicated: Secondary | ICD-10-CM | POA: Diagnosis present

## 2022-11-02 DIAGNOSIS — D509 Iron deficiency anemia, unspecified: Secondary | ICD-10-CM | POA: Diagnosis present

## 2022-11-02 DIAGNOSIS — R0902 Hypoxemia: Secondary | ICD-10-CM | POA: Diagnosis present

## 2022-11-02 DIAGNOSIS — Z9851 Tubal ligation status: Secondary | ICD-10-CM

## 2022-11-02 DIAGNOSIS — E785 Hyperlipidemia, unspecified: Secondary | ICD-10-CM | POA: Diagnosis present

## 2022-11-02 DIAGNOSIS — F32A Depression, unspecified: Secondary | ICD-10-CM | POA: Diagnosis present

## 2022-11-02 DIAGNOSIS — Z9189 Other specified personal risk factors, not elsewhere classified: Secondary | ICD-10-CM

## 2022-11-02 DIAGNOSIS — M797 Fibromyalgia: Secondary | ICD-10-CM | POA: Diagnosis present

## 2022-11-02 DIAGNOSIS — M5417 Radiculopathy, lumbosacral region: Secondary | ICD-10-CM | POA: Diagnosis present

## 2022-11-02 DIAGNOSIS — R600 Localized edema: Secondary | ICD-10-CM | POA: Diagnosis present

## 2022-11-02 DIAGNOSIS — Z6835 Body mass index (BMI) 35.0-35.9, adult: Secondary | ICD-10-CM

## 2022-11-02 LAB — RESP PANEL BY RT-PCR (RSV, FLU A&B, COVID)  RVPGX2
Influenza A by PCR: NEGATIVE
Influenza B by PCR: NEGATIVE
Resp Syncytial Virus by PCR: NEGATIVE
SARS Coronavirus 2 by RT PCR: NEGATIVE

## 2022-11-02 LAB — I-STAT CHEM 8, ED
BUN: 14 mg/dL (ref 6–20)
Calcium, Ion: 1.15 mmol/L (ref 1.15–1.40)
Chloride: 107 mmol/L (ref 98–111)
Creatinine, Ser: 0.6 mg/dL (ref 0.44–1.00)
Glucose, Bld: 116 mg/dL — ABNORMAL HIGH (ref 70–99)
HCT: 41 % (ref 36.0–46.0)
Hemoglobin: 13.9 g/dL (ref 12.0–15.0)
Potassium: 3.9 mmol/L (ref 3.5–5.1)
Sodium: 143 mmol/L (ref 135–145)
TCO2: 24 mmol/L (ref 22–32)

## 2022-11-02 LAB — COMPREHENSIVE METABOLIC PANEL
ALT: 35 U/L (ref 0–44)
AST: 41 U/L (ref 15–41)
Albumin: 3.2 g/dL — ABNORMAL LOW (ref 3.5–5.0)
Alkaline Phosphatase: 94 U/L (ref 38–126)
Anion gap: 11 (ref 5–15)
BUN: 14 mg/dL (ref 6–20)
CO2: 23 mmol/L (ref 22–32)
Calcium: 8.1 mg/dL — ABNORMAL LOW (ref 8.9–10.3)
Chloride: 106 mmol/L (ref 98–111)
Creatinine, Ser: 0.84 mg/dL (ref 0.44–1.00)
GFR, Estimated: >60 mL/min (ref >60)
Glucose, Bld: 120 mg/dL — ABNORMAL HIGH (ref 70–99)
Potassium: 3.7 mmol/L (ref 3.5–5.1)
Sodium: 140 mmol/L (ref 135–145)
Total Bilirubin: 0.3 mg/dL (ref 0.3–1.2)
Total Protein: 6.1 g/dL — ABNORMAL LOW (ref 6.5–8.1)

## 2022-11-02 LAB — CBC WITH DIFFERENTIAL/PLATELET
Abs Immature Granulocytes: 0 10*3/uL (ref 0.00–0.07)
Basophils Absolute: 0 10*3/uL (ref 0.0–0.1)
Basophils Relative: 1 %
Eosinophils Absolute: 0.1 10*3/uL (ref 0.0–0.5)
Eosinophils Relative: 3 %
HCT: 41.8 % (ref 36.0–46.0)
Hemoglobin: 13.6 g/dL (ref 12.0–15.0)
Immature Granulocytes: 0 %
Lymphocytes Relative: 16 %
Lymphs Abs: 0.5 10*3/uL — ABNORMAL LOW (ref 0.7–4.0)
MCH: 29.8 pg (ref 26.0–34.0)
MCHC: 32.5 g/dL (ref 30.0–36.0)
MCV: 91.5 fL (ref 80.0–100.0)
Monocytes Absolute: 0.1 10*3/uL (ref 0.1–1.0)
Monocytes Relative: 4 %
Neutro Abs: 2.5 10*3/uL (ref 1.7–7.7)
Neutrophils Relative %: 76 %
Platelets: 236 10*3/uL (ref 150–400)
RBC: 4.57 MIL/uL (ref 3.87–5.11)
RDW: 14 % (ref 11.5–15.5)
WBC: 3.3 10*3/uL — ABNORMAL LOW (ref 4.0–10.5)
nRBC: 0 % (ref 0.0–0.2)

## 2022-11-02 LAB — LIPASE, BLOOD: Lipase: 25 U/L (ref 11–51)

## 2022-11-02 LAB — TROPONIN I (HIGH SENSITIVITY)
Troponin I (High Sensitivity): 8 ng/L (ref <18)
Troponin I (High Sensitivity): 12 ng/L (ref <18)

## 2022-11-02 LAB — BRAIN NATRIURETIC PEPTIDE: B Natriuretic Peptide: 80.4 pg/mL (ref 0.0–100.0)

## 2022-11-02 LAB — I-STAT BETA HCG BLOOD, ED (MC, WL, AP ONLY): I-stat hCG, quantitative: 7.5 m[IU]/mL — ABNORMAL HIGH (ref <5)

## 2022-11-02 LAB — LACTIC ACID, PLASMA: Lactic Acid, Venous: 3.1 mmol/L (ref 0.5–1.9)

## 2022-11-02 LAB — STREP PNEUMONIAE URINARY ANTIGEN: Strep Pneumo Urinary Antigen: NEGATIVE

## 2022-11-02 MED ORDER — POLYETHYLENE GLYCOL 3350 17 G PO PACK: 17.0000 g | PACK | Freq: Every day | ORAL | Status: DC | PRN

## 2022-11-02 MED ORDER — ACETAMINOPHEN 650 MG RE SUPP: 650.0000 mg | Freq: Four times a day (QID) | RECTAL | Status: DC | PRN

## 2022-11-02 MED ORDER — LAMOTRIGINE 100 MG PO TABS
50.0000 mg | ORAL_TABLET | Freq: Two times a day (BID) | ORAL | Status: DC
  Administered 2022-11-02 – 2022-11-05 (×7): 50 mg via ORAL
  Filled 2022-11-02: qty 1
  Filled 2022-11-02 (×2): qty 2
  Filled 2022-11-02: qty 1
  Filled 2022-11-02: qty 2
  Filled 2022-11-02: qty 1
  Filled 2022-11-02: qty 2
  Filled 2022-11-02: qty 1

## 2022-11-02 MED ORDER — GABAPENTIN 400 MG PO CAPS
400.0000 mg | ORAL_CAPSULE | Freq: Three times a day (TID) | ORAL | Status: DC
  Administered 2022-11-02 – 2022-11-05 (×9): 400 mg via ORAL
  Filled 2022-11-02 (×9): qty 1

## 2022-11-02 MED ORDER — GUAIFENESIN ER 600 MG PO TB12
600.0000 mg | ORAL_TABLET | Freq: Two times a day (BID) | ORAL | Status: DC | PRN
  Administered 2022-11-04: 600 mg via ORAL
  Filled 2022-11-02: qty 1

## 2022-11-02 MED ORDER — SODIUM CHLORIDE 0.9 % IV BOLUS
500.0000 mL | Freq: Once | INTRAVENOUS | Status: AC
  Administered 2022-11-02: 500 mL via INTRAVENOUS

## 2022-11-02 MED ORDER — ONDANSETRON HCL 4 MG/2ML IJ SOLN
4.0000 mg | Freq: Once | INTRAMUSCULAR | Status: AC
  Administered 2022-11-02: 4 mg via INTRAVENOUS
  Filled 2022-11-02: qty 2

## 2022-11-02 MED ORDER — ATORVASTATIN CALCIUM 40 MG PO TABS
40.0000 mg | ORAL_TABLET | Freq: Every day | ORAL | Status: DC
  Administered 2022-11-02 – 2022-11-05 (×4): 40 mg via ORAL
  Filled 2022-11-02 (×4): qty 1

## 2022-11-02 MED ORDER — DULOXETINE HCL 60 MG PO CPEP
60.0000 mg | ORAL_CAPSULE | Freq: Every day | ORAL | Status: DC
  Administered 2022-11-02 – 2022-11-05 (×4): 60 mg via ORAL
  Filled 2022-11-02: qty 2
  Filled 2022-11-02 (×2): qty 1
  Filled 2022-11-02: qty 2

## 2022-11-02 MED ORDER — METHYLPREDNISOLONE SODIUM SUCC 125 MG IJ SOLR
125.0000 mg | Freq: Once | INTRAMUSCULAR | Status: AC
  Administered 2022-11-02: 125 mg via INTRAVENOUS
  Filled 2022-11-02: qty 2

## 2022-11-02 MED ORDER — SODIUM CHLORIDE 0.9 % IV SOLN
500.0000 mg | INTRAVENOUS | Status: DC
  Administered 2022-11-03 – 2022-11-04 (×2): 500 mg via INTRAVENOUS
  Filled 2022-11-02 (×2): qty 5

## 2022-11-02 MED ORDER — SODIUM CHLORIDE 0.9 % IV SOLN
2.0000 g | INTRAVENOUS | Status: DC
  Administered 2022-11-03 – 2022-11-05 (×3): 2 g via INTRAVENOUS
  Filled 2022-11-02 (×3): qty 20

## 2022-11-02 MED ORDER — SODIUM CHLORIDE 0.9 % IV SOLN: INTRAVENOUS | Status: DC

## 2022-11-02 MED ORDER — UMECLIDINIUM BROMIDE 62.5 MCG/ACT IN AEPB
1.0000 | INHALATION_SPRAY | Freq: Every day | RESPIRATORY_TRACT | Status: DC
  Administered 2022-11-02 – 2022-11-04 (×3): 1 via RESPIRATORY_TRACT
  Filled 2022-11-02 (×2): qty 7

## 2022-11-02 MED ORDER — DOCUSATE SODIUM 100 MG PO CAPS
100.0000 mg | ORAL_CAPSULE | Freq: Two times a day (BID) | ORAL | Status: DC
  Administered 2022-11-02 – 2022-11-05 (×5): 100 mg via ORAL
  Filled 2022-11-02 (×5): qty 1

## 2022-11-02 MED ORDER — IOHEXOL 350 MG/ML SOLN
75.0000 mL | Freq: Once | INTRAVENOUS | Status: AC | PRN
  Administered 2022-11-02: 75 mL via INTRAVENOUS

## 2022-11-02 MED ORDER — HYDRALAZINE HCL 20 MG/ML IJ SOLN: 5.0000 mg | INTRAMUSCULAR | Status: DC | PRN

## 2022-11-02 MED ORDER — ONDANSETRON HCL 4 MG/2ML IJ SOLN: 4.0000 mg | Freq: Four times a day (QID) | INTRAMUSCULAR | Status: DC | PRN

## 2022-11-02 MED ORDER — ONDANSETRON HCL 4 MG PO TABS: 4.0000 mg | ORAL_TABLET | Freq: Four times a day (QID) | ORAL | Status: DC | PRN

## 2022-11-02 MED ORDER — SODIUM CHLORIDE 0.9 % IV BOLUS
2500.0000 mL | Freq: Once | INTRAVENOUS | Status: AC
  Administered 2022-11-02: 2500 mL via INTRAVENOUS

## 2022-11-02 MED ORDER — ACETAMINOPHEN 325 MG PO TABS
650.0000 mg | ORAL_TABLET | Freq: Four times a day (QID) | ORAL | Status: DC | PRN
  Administered 2022-11-03 – 2022-11-04 (×3): 650 mg via ORAL
  Filled 2022-11-02 (×3): qty 2

## 2022-11-02 MED ORDER — BENZONATATE 100 MG PO CAPS
200.0000 mg | ORAL_CAPSULE | Freq: Once | ORAL | Status: AC
  Administered 2022-11-02: 200 mg via ORAL
  Filled 2022-11-02: qty 2

## 2022-11-02 MED ORDER — SODIUM CHLORIDE 0.9 % IV SOLN
1.0000 g | Freq: Once | INTRAVENOUS | Status: AC
  Administered 2022-11-02: 1 g via INTRAVENOUS
  Filled 2022-11-02: qty 10

## 2022-11-02 MED ORDER — MIRTAZAPINE 15 MG PO TABS
15.0000 mg | ORAL_TABLET | Freq: Every day | ORAL | Status: DC
  Administered 2022-11-02 – 2022-11-04 (×3): 15 mg via ORAL
  Filled 2022-11-02 (×3): qty 1

## 2022-11-02 MED ORDER — ENOXAPARIN SODIUM 40 MG/0.4ML IJ SOSY
40.0000 mg | PREFILLED_SYRINGE | INTRAMUSCULAR | Status: DC
  Administered 2022-11-02 – 2022-11-04 (×3): 40 mg via SUBCUTANEOUS
  Filled 2022-11-02 (×3): qty 0.4

## 2022-11-02 MED ORDER — SODIUM CHLORIDE 0.9% FLUSH
3.0000 mL | Freq: Two times a day (BID) | INTRAVENOUS | Status: DC
  Administered 2022-11-02 – 2022-11-05 (×6): 3 mL via INTRAVENOUS

## 2022-11-02 MED ORDER — TIZANIDINE HCL 2 MG PO TABS
2.0000 mg | ORAL_TABLET | Freq: Every day | ORAL | Status: DC
  Administered 2022-11-02 – 2022-11-05 (×4): 2 mg via ORAL
  Filled 2022-11-02 (×4): qty 1

## 2022-11-02 MED ORDER — IPRATROPIUM-ALBUTEROL 0.5-2.5 (3) MG/3ML IN SOLN
3.0000 mL | Freq: Once | RESPIRATORY_TRACT | Status: AC
  Administered 2022-11-02: 3 mL via RESPIRATORY_TRACT
  Filled 2022-11-02: qty 3

## 2022-11-02 MED ORDER — HYDROCODONE-ACETAMINOPHEN 5-325 MG PO TABS: 1.0000 | ORAL_TABLET | ORAL | Status: DC | PRN

## 2022-11-02 MED ORDER — BISACODYL 5 MG PO TBEC: 5.0000 mg | DELAYED_RELEASE_TABLET | Freq: Every day | ORAL | Status: DC | PRN

## 2022-11-02 MED ORDER — ALBUTEROL SULFATE (2.5 MG/3ML) 0.083% IN NEBU: 2.5000 mg | INHALATION_SOLUTION | RESPIRATORY_TRACT | Status: DC | PRN

## 2022-11-02 MED ORDER — FLUTICASONE PROPIONATE 50 MCG/ACT NA SUSP
2.0000 | Freq: Every day | NASAL | Status: DC
  Administered 2022-11-02 – 2022-11-04 (×3): 2 via NASAL
  Filled 2022-11-02: qty 16

## 2022-11-02 MED ORDER — MORPHINE SULFATE (PF) 2 MG/ML IV SOLN: 2.0000 mg | INTRAVENOUS | Status: DC | PRN

## 2022-11-02 MED ORDER — SODIUM CHLORIDE 0.9 % IV SOLN
500.0000 mg | Freq: Once | INTRAVENOUS | Status: AC
  Administered 2022-11-02: 500 mg via INTRAVENOUS
  Filled 2022-11-02: qty 5

## 2022-11-02 NOTE — ED Notes (Signed)
Unable to obtain blood culture before abx. MD notified.

## 2022-11-02 NOTE — ED Provider Notes (Signed)
[facility] EMERGENCY DEPARTMENT Provider Note   CSN: [id number] Arrival date & time: 11/02/22  0526     History  Chief Complaint  Patient presents with   Shortness of Breath    [name] is a 47 y.o. female.  The history is provided by the patient, the EMS personnel and medical records.  Shortness of Breath [name] is a 47 y.o. female who presents to the Emergency Department complaining of sob.  She presents to the ED by EMS for evaluation of sob.  One week ago she started to feel poorly with subjective fever, cough, sob and was seen at another facility and diagnosed with influenza.  Since then she has been at home resting with minimal activity.  Today she tried to move around and had significant worsening of her sob with chest discomfort and developed hemoptysis, described as multiple episodes about the size of a teaspoon.  She also complains of upper abdominal pain for several days with associated N/V and has also had blood in her vomit.  No epistaxis.  No hematochezia or melena.  Last BM several days ago.  Has BLE edema for two days.    Hx/o COPD/asthma, HTN.    EMS reports room air sats of 76%, requiring 6L to increase to 93%.       Home Medications Prior to Admission medications   Medication Sig Start Date End Date Taking? Authorizing Provider  albuterol (PROAIR HFA) 108 (90 Base) MCG/ACT inhaler INHALE 1 TO 2 PUFFS INTO LUNGS EVERY 6 HOURS AS NEEDED FOR WHEEZING OR SHORTNESS OF BREATH Patient taking differently: Inhale 1 puff into the lungs every 6 (six) hours as needed for wheezing or shortness of breath. 03/01/21   Gardenia Phlegm, MD  atorvastatin (LIPITOR) 40 MG tablet TAKE 1 TABLET(40 MG) BY MOUTH DAILY Patient taking differently: Take 40 mg by mouth daily. 04/23/21   Evlyn Kanner, MD  DULoxetine (CYMBALTA) 60 MG capsule TAKE 1 CAPSULE(60 MG) BY MOUTH DAILY Patient taking differently: Take 60 mg by mouth daily. 03/16/21   Remo Lipps, MD   fluticasone [facility]) 50 MCG/ACT nasal spray instill 2 sprays into each nostril once daily 12/23/20   Seawell, Jaimie A, DO  gabapentin (NEURONTIN) 400 MG capsule Take 400 mg by mouth 3 (three) times daily. 01/13/22   [provider]  lamoTRIgine (LAMICTAL) 25 MG tablet TAKE 2 TABLETS BY MOUTH TWICE DAILY Patient taking differently: Take 50 mg by mouth 2 (two) times daily. 03/11/22   Everlena Cooper, Adam R, DO  meloxicam (MOBIC) 7.5 MG tablet TAKE 1 TABLET(7.5 MG) BY MOUTH DAILY Patient taking differently: Take 7.5 mg by mouth daily. 09/22/21   Rehman, Areeg N, DO  mirtazapine (REMERON) 15 MG tablet TAKE 1 TABLET(15 MG) BY MOUTH AT BEDTIME Patient taking differently: Take 15 mg by mouth at bedtime. 07/28/21   Rehman, Areeg N, DO  Multiple Vitamins-Minerals (MULTIVITAMIN WITH MINERALS) tablet Take 1 tablet by mouth daily.    [provider]  Tiotropium Bromide Monohydrate (SPIRIVA RESPIMAT) 2.5 MCG/ACT AERS Inhale 2 puffs into the lungs daily. 03/18/21   Alphonzo Severance, MD  tiZANidine (ZANAFLEX) 2 MG tablet Take 2 mg by mouth daily. 01/25/22   [provider]  varenicline (CHANTIX) 1 MG tablet FOLLOW INSTRUCTIONS ON SEPARATE PIECE OF PAPER Patient not taking: Reported on 08/19/2022 02/12/21   Rehman, Areeg N, DO      Allergies    Fluticasone    Review of Systems   Review  of Systems  Respiratory:  Positive for shortness of breath.   All other systems reviewed and are negative.   Physical Exam Updated Vital Signs BP (!) 94/56   Pulse (!) 122   Resp 18   Ht 5\' 4"  (1.626 m)   Wt 92.5 kg   SpO2 91%   BMI 35.02 kg/m  Physical Exam Vitals and nursing note reviewed.  Constitutional:      Appearance: She is well-developed.  HENT:     Head: Normocephalic and atraumatic.  Cardiovascular:     Rate and Rhythm: Regular rhythm. Tachycardia present.     Heart sounds: No murmur heard. Pulmonary:     Effort: Pulmonary effort is normal. No respiratory distress.     Comments:  Bibasilar crackles.  Decreased air movement in upper lung fields.  Abdominal:     Palpations: Abdomen is soft.     Tenderness: There is no abdominal tenderness. There is no guarding or rebound.  Musculoskeletal:        General: No swelling or tenderness.     Comments: 2+ DP pulses bilaterally  Skin:    General: Skin is warm and dry.  Neurological:     Mental Status: She is alert and oriented to person, place, and time.  Psychiatric:        Behavior: Behavior normal.     ED Results / Procedures / Treatments   Labs (all labs ordered are listed, but only abnormal results are displayed) Labs Reviewed  CBC WITH DIFFERENTIAL/PLATELET - Abnormal; Notable for the following components:      Result Value   WBC 3.3 (*)    Lymphs Abs 0.5 (*)    All other components within normal limits  I-STAT CHEM 8, ED - Abnormal; Notable for the following components:   Glucose, Bld 116 (*)    All other components within normal limits  RESP PANEL BY RT-PCR (RSV, FLU A&B, COVID)  RVPGX2  CULTURE, BLOOD (ROUTINE X 2)  CULTURE, BLOOD (ROUTINE X 2)  COMPREHENSIVE METABOLIC PANEL  LACTIC ACID, PLASMA  LACTIC ACID, PLASMA  LIPASE, BLOOD  BRAIN NATRIURETIC PEPTIDE  I-STAT BETA HCG BLOOD, ED ([facility], [facility], [facility] ONLY)  TROPONIN I (HIGH SENSITIVITY)  TROPONIN I (HIGH SENSITIVITY)    EKG EKG Interpretation  Date/Time:  Wednesday November 02 2022 05:36:30 EST Ventricular Rate:  124 PR Interval:  121 QRS Duration: 87 QT Interval:  296 QTC Calculation: 426 R Axis:   119 Text Interpretation: Sinus tachycardia Right axis deviation Low voltage, precordial leads Borderline T abnormalities, inferior leads Confirmed by Quintella Reichert [phone]) on 11/02/2022 6:00:25 AM  Radiology DG Chest Port 1 View  Result Date: 11/02/2022 CLINICAL DATA:  Shortness of breath and hemoptysis. EXAM: PORTABLE CHEST 1 VIEW COMPARISON:  05/11/2021 FINDINGS: 0546 hours. The cardio pericardial silhouette is enlarged. There is pulmonary  vascular congestion without overt pulmonary edema. Patchy airspace disease noted both lung bases. The visualized bony structures of the thorax are unremarkable. Telemetry leads overlie the chest. IMPRESSION: 1. Enlargement of the cardiopericardial silhouette with pulmonary vascular congestion. 2. Patchy bibasilar airspace disease pneumonia compatible with multifocal pneumonia. Electronically Signed   By: Misty Stanley M.D.   On: 11/02/2022 06:09    Procedures Procedures    Medications Ordered in ED Medications  sodium chloride 0.9 % bolus 500 mL (has no administration in time range)  ipratropium-albuterol (DUONEB) 0.5-2.5 (3) MG/3ML nebulizer solution 3 mL (has no administration in time range)  cefTRIAXone (ROCEPHIN) 1 g in sodium chloride 0.9 %  100 mL IVPB (has no administration in time range)  azithromycin (ZITHROMAX) 500 mg in sodium chloride 0.9 % 250 mL IVPB (has no administration in time range)  methylPREDNISolone sodium succinate (SOLU-MEDROL) 125 mg/2 mL injection 125 mg (125 mg Intravenous Given 11/02/22 0553)  benzonatate (TESSALON) capsule 200 mg (200 mg Oral Given 11/02/22 0552)  ondansetron (ZOFRAN) injection 4 mg (4 mg Intravenous Given 11/02/22 0553)    ED Course/ Medical Decision Making/ A&P                             Medical Decision Making Amount and/or Complexity of Data Reviewed Labs: ordered. Radiology: ordered.  Risk Prescription drug management.   Patient here for evaluation of respiratory distress and difficulty breathing.  She does not have significant oxygen requirement.  She does have crackles on lung examination, tachycardia.  Chest x-ray with pulmonary vascular congestion and multifocal infiltrates concerning for pneumonia.  She was started on broad-spectrum antibiotics.  Blood pressures are preserved, will hold aggressive fluid resuscitation pending additional studies as it is currently unclear if this is true pneumonia versus cardiac event.  She was treated  empirically with Solu-Medrol, DuoNeb for possible reactive airway as well.  Given her hemoptysis and possible hematemesis CTA PE study as well as CT abdomen pelvis ordered.  Patient care transferred pending additional labs and imaging.        Final Clinical Impression(s) / ED Diagnoses Final diagnoses:  None    Rx / DC Orders ED Discharge Orders     None         Quintella Reichert, MD 11/02/22 0710

## 2022-11-02 NOTE — ED Notes (Signed)
Md notified on BP

## 2022-11-02 NOTE — ED Notes (Signed)
Pt was stuck twice. Was unsuccessful. Pt wanted to see if it could be possible to get it from her IV.

## 2022-11-02 NOTE — ED Notes (Signed)
EDP Dr. Gustavus Messing advised CT pending bc iStat HCG was marked completed but has not resulted. Dr. Gustavus Messing found paper results in minilab and showed to lead CT tech so that we could proceed with scan.

## 2022-11-02 NOTE — ED Provider Notes (Signed)
Care assumed from Dr. Ralene Bathe.  At time of transfer care, patient is awaiting admission for multifocal pneumonia with new oxygen requirement.  There was concern for ruling out pulmonary embolism or something else in her abdomen so she is waiting on a CT PE study and CT abdomen pelvis.  Blood pressures dropped slightly into the 80s and she was given some fluids with good response.  Heart rate is much improved from arrival.  Awaiting CT images to return and then will call for admission.  10:38 AM Attempted to inquire why patient has not been able get her CT scans it appears patient did not have a finalized pregnancy test although it previously said completed in the chart at 5:45 AM.  We were able to find the piece of paper saying that it was negative and patient will get her CT scans and we will call for admission.  12:00 PM CT returned showing no acute abnormalities in the abdomen pelvis.  CT of the chest confirmed no pulm embolism but does show multifocal pneumonia and abnormalities.  Will now call for admission.  Clinical Impression: 1. Multifocal pneumonia   2. Hypoxia   3. Shortness of breath     Disposition: Admit  This note was prepared with assistance of Dragon voice recognition software. Occasional wrong-word or sound-a-like substitutions may have occurred due to the inherent limitations of voice recognition software.        [name], Gwenyth Allegra, MD 11/02/22 1247

## 2022-11-02 NOTE — H&P (Signed)
History and Physical    Patient: [name] NID:[id number] DOB: 05-May-1976 DOA: 11/02/2022 DOS: the patient was seen and examined on 11/02/2022 PCP: Pcp, No  Patient coming from: Home - lives with mother and children; NOK: Mother, Monica Martinez, [phone]   Chief Complaint: SOB  HPI: [name] is a 47 y.o. female with medical history significant of COPD and Sjogren's presenting with SOB.  She reports several days of worsening SOB.  This AM, it was worse and she was having hemoptysis.  EMS reported that she was 76% on RA and placed on 6L [place] O2.  No fever.  +cough.    ER Course:  Multifocal PNA with 5L O2 requirement.  +hemoptysis.       Review of Systems: As mentioned in the history of present illness. All other systems reviewed and are negative. Past Medical History:  Diagnosis Date   Asthma    Carpal tunnel syndrome    CIN III (cervical intraepithelial neoplasia III) 2017   Seen on colposcopy. LEEP actually showed CIN II with negative margins   COPD (chronic obstructive pulmonary disease) (HCC)    Hemorrhoids    Iron deficiency anemia    Sjogren's syndrome ([place])    Past Surgical History:  Procedure Laterality Date   CESAREAN SECTION     Tubal ligation     Social History:  reports that she has been smoking cigarettes. She has been smoking an average of .5 packs per day. She has never used smokeless tobacco. She reports current alcohol use. She reports that she does not use drugs.  No Active Allergies   Family History  Problem Relation Age of Onset   Neuropathy Mother    Migraines Mother    Diabetes Mother    Diabetes Father     Prior to Admission medications   Medication Sig Start Date End Date Taking? Authorizing Provider  albuterol (PROAIR HFA) 108 (90 Base) MCG/ACT inhaler INHALE 1 TO 2 PUFFS INTO LUNGS EVERY 6 HOURS AS NEEDED FOR WHEEZING OR SHORTNESS OF BREATH Patient taking differently: Inhale 1 puff into the lungs every 6 (six) hours as needed  for wheezing or shortness of breath. 03/01/21   Lyndal Pulley, MD  atorvastatin (LIPITOR) 40 MG tablet TAKE 1 TABLET(40 MG) BY MOUTH DAILY Patient taking differently: Take 40 mg by mouth daily. 04/23/21   Sanjuan Dame, MD  DULoxetine (CYMBALTA) 60 MG capsule TAKE 1 CAPSULE(60 MG) BY MOUTH DAILY Patient taking differently: Take 60 mg by mouth daily. 03/16/21   Andrew Au, MD  fluticasone [facility]) 50 MCG/ACT nasal spray instill 2 sprays into each nostril once daily 12/23/20   Seawell, Jaimie A, DO  gabapentin (NEURONTIN) 400 MG capsule Take 400 mg by mouth 3 (three) times daily. 01/13/22   [provider]  lamoTRIgine (LAMICTAL) 25 MG tablet TAKE 2 TABLETS BY MOUTH TWICE DAILY Patient taking differently: Take 50 mg by mouth 2 (two) times daily. 03/11/22   Tomi Likens, Adam R, DO  meloxicam (MOBIC) 7.5 MG tablet TAKE 1 TABLET(7.5 MG) BY MOUTH DAILY Patient taking differently: Take 7.5 mg by mouth daily. 09/22/21   Rehman, Areeg N, DO  mirtazapine (REMERON) 15 MG tablet TAKE 1 TABLET(15 MG) BY MOUTH AT BEDTIME Patient taking differently: Take 15 mg by mouth at bedtime. 07/28/21   Rehman, Areeg N, DO  Multiple Vitamins-Minerals (MULTIVITAMIN WITH MINERALS) tablet Take 1 tablet by mouth daily.    [provider]  Tiotropium Bromide Monohydrate (SPIRIVA RESPIMAT) 2.5 MCG/ACT AERS  Inhale 2 puffs into the lungs daily. 03/18/21   Alexandria Lodge, MD  tiZANidine (ZANAFLEX) 2 MG tablet Take 2 mg by mouth daily. 01/25/22   [provider]  varenicline (CHANTIX) 1 MG tablet FOLLOW INSTRUCTIONS ON SEPARATE PIECE OF PAPER Patient not taking: Reported on 08/19/2022 02/12/21   Mike Craze, DO    Physical Exam: Vitals:   11/02/22 1619 11/02/22 1700 11/02/22 1745 11/02/22 1826  BP:  (!) 110/59 103/63 (!) 114/96  Pulse:  93 92 97  Resp:  12 12 19   Temp: 98.2 F (36.8 C)   98.1 F (36.7 C)  TempSrc: Oral   Oral  SpO2:  95% 96% 94%  Weight:      Height:       General:  Appears calm and  comfortable and is in NAD, initially not on [place] O2 and sitting up in bed but hypoxic into 80s with conversation; pink hair Eyes:  EOMI, normal lids, iris ENT:  grossly normal hearing, lips & tongue, mmm Neck:  no LAD, masses or thyromegaly Cardiovascular:  RRR, no m/r/g. No LE edema.  Respiratory:   Diffuse coarse rhonchi.  Normal respiratory effort. Abdomen:  soft, NT, ND Skin:  no rash or induration seen on limited exam Musculoskeletal:  grossly normal tone BUE/BLE, good ROM, no bony abnormality Psychiatric: blunted mood and affect, speech fluent and appropriate, AOx3 Neurologic:  CN 2-12 grossly intact, moves all extremities in coordinated fashion   Radiological Exams on Admission: Independently reviewed - see discussion in A/P where applicable  CT Abdomen Pelvis W Contrast  Result Date: 11/02/2022 CLINICAL DATA:  Abdominal pain, shortness of breath EXAM: CT ABDOMEN AND PELVIS WITH CONTRAST TECHNIQUE: Multidetector CT imaging of the abdomen and pelvis was performed using the standard protocol following bolus administration of intravenous contrast. RADIATION DOSE REDUCTION: This exam was performed according to the departmental dose-optimization program which includes automated exposure control, adjustment of the mA and/or kV according to patient size and/or use of iterative reconstruction technique. CONTRAST:  78mL OMNIPAQUE IOHEXOL 350 MG/ML SOLN COMPARISON:  None Available. FINDINGS: Lower chest: Please see separately reported examination of the abdomen and pelvis. Hepatobiliary: No solid liver abnormality is seen. No gallstones, gallbladder wall thickening, or biliary dilatation. Pancreas: Unremarkable. No pancreatic ductal dilatation or surrounding inflammatory changes. Spleen: Normal in size without significant abnormality. Adrenals/Urinary Tract: Adrenal glands are unremarkable. Kidneys are normal, without renal calculi, solid lesion, or hydronephrosis. Bladder is unremarkable.  Stomach/Bowel: Stomach is within normal limits. Appendix appears normal. No evidence of bowel wall thickening, distention, or inflammatory changes. Moderate burden of stool throughout the colon. Vascular/Lymphatic: No significant vascular findings are present. No enlarged abdominal or pelvic lymph nodes. Reproductive: No mass or other significant abnormality. Other: No abdominal wall hernia or abnormality. No ascites. Musculoskeletal: No acute or significant osseous findings. IMPRESSION: 1. No acute CT findings of the abdomen or pelvis to explain abdominal pain. 2. Moderate burden of stool throughout the colon. Electronically Signed   By: Delanna Ahmadi M.D.   On: 11/02/2022 11:47   CT Angio Chest PE W/Cm &/Or Wo Cm  Result Date: 11/02/2022 CLINICAL DATA:  Shortness of breath, PE suspected EXAM: CT ANGIOGRAPHY CHEST WITH CONTRAST TECHNIQUE: Multidetector CT imaging of the chest was performed using the standard protocol during bolus administration of intravenous contrast. Multiplanar CT image reconstructions and MIPs were obtained to evaluate the vascular anatomy. RADIATION DOSE REDUCTION: This exam was performed according to the departmental dose-optimization program which includes automated exposure control, adjustment  of the mA and/or kV according to patient size and/or use of iterative reconstruction technique. CONTRAST:  53mL OMNIPAQUE IOHEXOL 350 MG/ML SOLN COMPARISON:  None Available. FINDINGS: Cardiovascular: Satisfactory opacification of the pulmonary arteries to the segmental level. No evidence of pulmonary embolism. Normal heart size. No pericardial effusion. Mediastinum/Nodes: Numerous prominent mediastinal and bilateral hilar lymph nodes. Thyroid gland, trachea, and esophagus demonstrate no significant findings. Lungs/Pleura: Diffuse bilateral ground-glass attenuation of the airspaces, with extensive heterogeneous and consolidative airspace opacity throughout the dependent lungs. Numerous thin walled  centrilobular and paraseptal cysts of varying sizes, which are seen throughout the lungs although concentrated in the apices. Tracheobronchomalacia (series 9, image 43). No pleural effusion or pneumothorax. Upper Abdomen: Please see separately reported examination of the abdomen and pelvis. Musculoskeletal: No chest wall abnormality. No acute osseous findings. 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 Review of the MIP images confirms the above findings. IMPRESSION: 1. Negative examination for pulmonary embolism. 2. Diffuse bilateral ground-glass attenuation of the airspaces, with extensive heterogeneous and consolidative airspace opacity throughout the dependent lungs. Findings are consistent with extensive multifocal infection and/or aspiration. 3. Numerous thin walled centrilobular and paraseptal cysts of varying sizes, which are seen throughout the lungs although concentrated in the apices. This most likely reflects a combination of emphysema and postinfectious or inflammatory pneumatoceles, although in general cystic lung disease such as desquamative interstitial pneumonitis, lymphangioleiomyomatosis or lymphocytic interstitial pneumonitis are differential considerations. 4. Tracheobronchomalacia. 5. Numerous prominent mediastinal and bilateral hilar lymph nodes, likely reactive. Electronically Signed   By: Delanna Ahmadi M.D.   On: 11/02/2022 11:40   DG Chest Port 1 View  Result Date: 11/02/2022 CLINICAL DATA:  Shortness of breath and hemoptysis. EXAM: PORTABLE CHEST 1 VIEW COMPARISON:  05/11/2021 FINDINGS: 0546 hours. The cardio pericardial silhouette is enlarged. There is pulmonary vascular congestion without overt pulmonary edema. Patchy airspace disease noted both lung bases. The visualized bony structures of the thorax are unremarkable. Telemetry leads overlie the chest. IMPRESSION: 1. Enlargement of the cardiopericardial silhouette with pulmonary vascular congestion.  2. Patchy bibasilar airspace disease pneumonia compatible with multifocal pneumonia. Electronically Signed   By: Misty Stanley M.D.   On: 11/02/2022 06:09    EKG: Independently reviewed.  Sinus tachycardia with rate 124; nonspecific ST changes with no evidence of acute ischemia   Labs on Admission: I have personally reviewed the available labs and imaging studies at the time of the admission.  Pertinent labs:    Glucose 120 Albumin 3.2 BNP 80.4 HS troponin 8 WBC 3.3 COVID/RSV/flu negative   Assessment and Plan: Principal Problem:   Multifocal pneumonia Active Problems:   COPD (chronic obstructive pulmonary disease) (HCC)   Sjogren's syndrome (HCC)   Severe depression (HCC)   Hyperlipidemia   Sepsis due to pneumonia (HCC)   Seizure disorder (HCC)    Multifocal PNA with sepsis with underlying COPD -Patient presenting with cough, decreased oxygen saturation, and multifocal infiltrates on chest x-ray -This appears to be most likely community-acquired pneumonia.  -Influenza negative. -COVID-19 negative. -Sputum and blood cultures pending -Respiratory virus panel ordered to include pertussis -Will order lower respiratory tract procalcitonin level.   >0.5 indicates infection and >>0.5 indicates more serious disease.  As the procalcitonin level normalizes, it will be reasonable to consider de-escalation of antibiotic coverage.  The sensitivity of procalcitonin is variable and should not be used alone to guide treatment. -CURB-65 score is 1 - will admit the patient to progressive care -Pneumonia Severity Index (PSI)  is Class 2, <1% mortality. -Will start Azithromycin 500 mg IV daily and Rocephin due to no risk factors for MDR cause  -LR @ 75cc/hr after sepsis bolus (30 cc/kg) -Trend lactate -Repeat CBC in am -Will add albuterol PRN -Will add Mucinex for cough -Pulm was asked to review CT scan, does not appear to need bronchoscopy.  -Will need pulm outpatient f/u for lung cysts  (she could develop lung cysts in setting of lymphocytic interstitial pneumonitis related to Sjogrens) and bronchomalacia.  -Continue Spiriva  HLD -Continue atorvastatin  Mood d/o -Continue duloxetine, gabepentin (but not also Lyrica), mirtazepine, tizanidine  Seizure d/o -Patient reports that she has recently been out of Lamictal; will restart -Continue gabapentin     Advance Care Planning:   Code Status: Full Code - Code status was discussed with the patient and/or family at the time of admission.  The patient would want to receive full resuscitative measures at this time.   Consults: PT/OT; RT  DVT Prophylaxis: Lovenox  Family Communication: Mother was present for most of the evaluation  Severity of Illness: The appropriate patient status for this patient is INPATIENT. Inpatient status is judged to be reasonable and necessary in order to provide the required intensity of service to ensure the patient's safety. The patient's presenting symptoms, physical exam findings, and initial radiographic and laboratory data in the context of their chronic comorbidities is felt to place them at high risk for further clinical deterioration. Furthermore, it is not anticipated that the patient will be medically stable for discharge from the hospital within 2 midnights of admission.   * I certify that at the point of admission it is my clinical judgment that the patient will require inpatient hospital care spanning beyond 2 midnights from the point of admission due to high intensity of service, high risk for further deterioration and high frequency of surveillance required.*  Author: Jonah Blue, MD 11/02/2022 6:44 PM  For on call review [email or web address].

## 2022-11-02 NOTE — ED Notes (Signed)
Patient placed on 2L [place] by MD Ralene Bathe at bedside.

## 2022-11-02 NOTE — ED Notes (Signed)
Paused maintenance fluids at this time to get fluid bolus in quicker.

## 2022-11-02 NOTE — ED Notes (Signed)
RN and phlebotomy unable to obtain labs

## 2022-11-02 NOTE — ED Notes (Signed)
Pt sitting up in bed, pt finished meal tray, pt refusing blood draw at this time.  Resps even and unlabored.  Pt satting 92% on room air.

## 2022-11-02 NOTE — ED Triage Notes (Addendum)
Patient BIB [facility] c/o sudden onset of shob that started this morning that led to patient coughing up "bloody sputum."  Patient diagnosed with the flu approx 1 week ago.  EMS reports patient was 76% on RA, and they put her on 6L [place] that brought her up to 93%. Patient has been having spells off and on to where she couldn't breathe but she reports this morning that she couldn't breathe at all, so she called 911.  Upon arrival, patient also reports that she's been having bloody vomit as well.  186/80 120 HR

## 2022-11-03 DIAGNOSIS — M3500 Sicca syndrome, unspecified: Secondary | ICD-10-CM

## 2022-11-03 DIAGNOSIS — R7989 Other specified abnormal findings of blood chemistry: Secondary | ICD-10-CM | POA: Insufficient documentation

## 2022-11-03 DIAGNOSIS — E66812 Obesity, class 2: Secondary | ICD-10-CM | POA: Insufficient documentation

## 2022-11-03 DIAGNOSIS — J398 Other specified diseases of upper respiratory tract: Secondary | ICD-10-CM | POA: Diagnosis not present

## 2022-11-03 DIAGNOSIS — J449 Chronic obstructive pulmonary disease, unspecified: Secondary | ICD-10-CM

## 2022-11-03 DIAGNOSIS — M797 Fibromyalgia: Secondary | ICD-10-CM

## 2022-11-03 DIAGNOSIS — J189 Pneumonia, unspecified organism: Secondary | ICD-10-CM | POA: Diagnosis not present

## 2022-11-03 DIAGNOSIS — J984 Other disorders of lung: Secondary | ICD-10-CM

## 2022-11-03 DIAGNOSIS — F32A Depression, unspecified: Secondary | ICD-10-CM

## 2022-11-03 DIAGNOSIS — M5417 Radiculopathy, lumbosacral region: Secondary | ICD-10-CM

## 2022-11-03 DIAGNOSIS — G40909 Epilepsy, unspecified, not intractable, without status epilepticus: Secondary | ICD-10-CM

## 2022-11-03 DIAGNOSIS — R042 Hemoptysis: Secondary | ICD-10-CM

## 2022-11-03 DIAGNOSIS — E872 Acidosis, unspecified: Secondary | ICD-10-CM

## 2022-11-03 DIAGNOSIS — E669 Obesity, unspecified: Secondary | ICD-10-CM | POA: Insufficient documentation

## 2022-11-03 LAB — CBC
HCT: 34.3 % — ABNORMAL LOW (ref 36.0–46.0)
Hemoglobin: 10.4 g/dL — ABNORMAL LOW (ref 12.0–15.0)
MCH: 28.9 pg (ref 26.0–34.0)
MCHC: 30.3 g/dL (ref 30.0–36.0)
MCV: 95.3 fL (ref 80.0–100.0)
Platelets: 190 10*3/uL (ref 150–400)
RBC: 3.6 MIL/uL — ABNORMAL LOW (ref 3.87–5.11)
RDW: 14.5 % (ref 11.5–15.5)
WBC: 10.3 10*3/uL (ref 4.0–10.5)
nRBC: 0 % (ref 0.0–0.2)

## 2022-11-03 LAB — LEGIONELLA PNEUMOPHILA SEROGP 1 UR AG: L. pneumophila Serogp 1 Ur Ag: NEGATIVE

## 2022-11-03 LAB — PROCALCITONIN: Procalcitonin: 15.74 ng/mL

## 2022-11-03 LAB — BASIC METABOLIC PANEL WITH GFR
Anion gap: 6 (ref 5–15)
BUN: 14 mg/dL (ref 6–20)
CO2: 23 mmol/L (ref 22–32)
Calcium: 7.6 mg/dL — ABNORMAL LOW (ref 8.9–10.3)
Chloride: 110 mmol/L (ref 98–111)
Creatinine, Ser: 0.88 mg/dL (ref 0.44–1.00)
GFR, Estimated: >60 mL/min (ref >60)
Glucose, Bld: 117 mg/dL — ABNORMAL HIGH (ref 70–99)
Potassium: 4 mmol/L (ref 3.5–5.1)
Sodium: 139 mmol/L (ref 135–145)

## 2022-11-03 LAB — HIV ANTIBODY (ROUTINE TESTING W REFLEX): HIV Screen 4th Generation wRfx: NONREACTIVE

## 2022-11-03 MED ORDER — MOMETASONE FURO-FORMOTEROL FUM 200-5 MCG/ACT IN AERO
2.0000 | INHALATION_SPRAY | Freq: Two times a day (BID) | RESPIRATORY_TRACT | Status: DC
  Administered 2022-11-03 – 2022-11-05 (×5): 2 via RESPIRATORY_TRACT
  Filled 2022-11-03 (×2): qty 8.8

## 2022-11-03 MED ORDER — HYDROCODONE-ACETAMINOPHEN 5-325 MG PO TABS
1.0000 | ORAL_TABLET | ORAL | Status: DC | PRN
  Administered 2022-11-04 – 2022-11-05 (×2): 1 via ORAL
  Filled 2022-11-03 (×2): qty 1

## 2022-11-03 MED ORDER — IPRATROPIUM-ALBUTEROL 0.5-2.5 (3) MG/3ML IN SOLN: 3.0000 mL | RESPIRATORY_TRACT | Status: DC | PRN

## 2022-11-03 NOTE — Progress Notes (Signed)
PT Cancellation Note  Patient Details Name: BERNA GITTO MRN: [id number] DOB: 02/10/1976   Cancelled Treatment:    Reason Eval/Treat Not Completed: PT screened, no needs identified, will sign off; spoke with pt and RN who both report pt ambulating to bathroom without difficulty.  Please re-order PT if needs arise.    Reginia Naas 11/03/2022, 2:21 PM Magda Kiel, PT Acute Rehabilitation Services [phone] 11/03/2022

## 2022-11-03 NOTE — Progress Notes (Signed)
PROGRESS NOTE  [name] ZOX:[id number] DOB: [birth year]   PCP: Pcp, No  Patient is from: Home. Lives with mother and children; NOK: Mother, Monica Martinez, [phone]   DOA: 11/02/2022 LOS: 1  Chief complaints Chief Complaint  Patient presents with   Shortness of Breath     Brief Narrative / Interim history: 47 year old F with PMH of COPD,Sjogren's, mood disorder, seizure disorder, obesity and former tobacco use disorder presenting with SOB, cough, hemoptysis and hypoxemia to 75 on RA when EMS arrived, and admitted for multifocal pneumonia.  CTA chest negative for PE but concerning for multifocal pneumonia, pneumatocele and tracheobronchomalacia.  She has mild leukopenia to 3.3.  BNP, troponin, COVID-19, influenza and RSV PCR negative.  Blood cultures drawn.  Started on ceftriaxone and azithromycin, and admitted.  Subjective: Seen and examined earlier this morning.  No major events overnight of this morning.  Feels better this morning.  She reports coughing up blood earlier in the morning about 3 AM.  She thinks hemoptysis was over a tablespoonful.  Denies chest pain, shortness of breath, cough, GI or UTI symptoms at the moment.  Objective: Vitals:   11/03/22 0545 11/03/22 0612 11/03/22 0700 11/03/22 0753  BP: (!) 102/51  113/64   Pulse: 99  (!) 101   Resp: 12  12   Temp:  98.7 F (37.1 C)  97.7 F (36.5 C)  TempSrc:  Oral  Oral  SpO2: 97%  95%   Weight:      Height:        Examination:  GENERAL: No apparent distress.  Nontoxic. HEENT: MMM.  Vision and hearing grossly intact.  NECK: Supple.  No apparent JVD.  RESP: 95% on RA.  No IWOB.  Fair aeration bilaterally.  Bibasilar crackles. CVS:  RRR. Heart sounds normal.  ABD/GI/GU: BS+. Abd soft, NTND.  MSK/EXT:  Moves extremities. No apparent deformity. No edema.  SKIN: no apparent skin lesion or wound NEURO: Sleepy but wakes to voice.  Oriented appropriately.  No apparent focal neuro deficit. PSYCH: Calm. Normal  affect.   Procedures:  None  Microbiology summarized: [id number], influenza and RSV PCR nonreactive. Blood cultures NGTD. Sputum culture pending  Assessment and plan: Principal Problem:   Multifocal pneumonia Active Problems:   COPD (chronic obstructive pulmonary disease) (HCC)   Sjogren's syndrome (HCC)   Depression   Fibromyalgia   Hyperlipidemia   Lumbosacral radiculopathy   Sepsis due to pneumonia (HCC)   Seizure disorder (HCC)   Elevated serum hCG   Lactic acidosis   Tracheobronchomalacia   Hemoptysis   Pneumatocele of lung  Multifocal pneumonia: Presents with shortness of breath, cough and hemoptysis.  Reportedly hypoxic to 75% on RA when EMS arrived.  Hypoxemia seems to have resolved.  CTA chest suggesting multifocal pneumonia, pneumatocele and tracheobronchomalacia.  Markedly elevated procalcitonin.  Has 60-pack-year history of smoking before she quit about a year ago.  COVID-19, influenza and RSV PCR nonreactive.  Blood cultures NGTD.  No recent travel or exposure to TB.  No healthcare exposure either.  Reports improvement in her breathing, cough and hemoptysis. -Continue CTX and azithromycin -Add Dulera.  Continue Incruse. -Change albuterol to DuoNebs every 4 hours as needed -Incentive spirometry/flutter valve -Mucolytic's and antitussive.  Pneumatocele/tracheobronchomalacia/possible COPD: Likely from COPD.  PFT in 2016 with minimal obstructive airway disease.  Patient has 60-pack-year history of smoking. -Outpatient follow-up with pulmonology  Hemoptysis: Likely from infection.  Seems to have subsided.  Not on blood thinners. -Manage infection as above -Continue monitoring  Lactic acidosis: Likely from hypoxemia -Recheck.   Hyperlipidemia -Continue atorvastatin   Mood disorder/chronic pain -Continue duloxetine, gabepentin (but not also Lyrica), mirtazepine, tizanidine   History of seizure: Reportedly out of her Lamictal recently. -Resume home Lamictal.   She is on 50 mg twice daily -Continue gabapentin  Normocytic anemia: Drop in Hgb likely dilutional. Recent Labs    01/28/22 0633 08/18/22 2235 11/02/22 0645 11/02/22 0708 11/03/22 0514  HGB 13.3 14.3 13.6 13.9 10.4*  -Check anemia panel in the morning -Monitor H&H  Elevated hCG: 7.5 on admission. -Repeat hCG 48 hours later  Class II obesity Body mass index is 35.02 kg/m.          DVT prophylaxis:  enoxaparin (LOVENOX) injection 40 mg Start: 11/02/22 1400  Code Status: Full code Family Communication: None at bedside. Level of care: Progressive Status is: Inpatient Remains inpatient appropriate because: Multifocal pneumonia requiring IV antibiotics.   Final disposition: Likely home once medically Stable, likely in the next 24 to 48 hours Consultants:  None  55 minutes with more than 50% spent in reviewing records, counseling patient/family and coordinating care.   Sch Meds:  Scheduled Meds:  atorvastatin  40 mg Oral Daily   docusate sodium  100 mg Oral BID   DULoxetine  60 mg Oral Daily   enoxaparin (LOVENOX) injection  40 mg Subcutaneous Q24H   fluticasone  2 spray Each Nare Daily   gabapentin  400 mg Oral TID   lamoTRIgine  50 mg Oral BID   mirtazapine  15 mg Oral QHS   mometasone-formoterol  2 puff Inhalation BID   sodium chloride flush  3 mL Intravenous Q12H   tiZANidine  2 mg Oral Daily   umeclidinium bromide  1 puff Inhalation Daily   Continuous Infusions:  azithromycin Stopped (11/03/22 0753)   cefTRIAXone (ROCEPHIN)  IV Stopped (11/03/22 0646)   PRN Meds:.acetaminophen **OR** acetaminophen, bisacodyl, guaiFENesin, hydrALAZINE, HYDROcodone-acetaminophen, ipratropium-albuterol, ondansetron **OR** ondansetron (ZOFRAN) IV, polyethylene glycol  Antimicrobials: Anti-infectives (From admission, onward)    Start     Dose/Rate Route Frequency Ordered Stop   11/03/22 0700  cefTRIAXone (ROCEPHIN) 2 g in sodium chloride 0.9 % 100 mL IVPB        2 g 200  mL/hr over 30 Minutes Intravenous Every 24 hours 11/02/22 1243 11/07/22 0659   11/03/22 0700  azithromycin (ZITHROMAX) 500 mg in sodium chloride 0.9 % 250 mL IVPB        500 mg 250 mL/hr over 60 Minutes Intravenous Every 24 hours 11/02/22 1243 11/07/22 0659   11/02/22 0645  cefTRIAXone (ROCEPHIN) 1 g in sodium chloride 0.9 % 100 mL IVPB        1 g 200 mL/hr over 30 Minutes Intravenous  Once 11/02/22 0635 11/02/22 1049   11/02/22 0645  azithromycin (ZITHROMAX) 500 mg in sodium chloride 0.9 % 250 mL IVPB        500 mg 250 mL/hr over 60 Minutes Intravenous  Once 11/02/22 0635 11/02/22 1049        I have personally reviewed the following labs and images: CBC: Recent Labs  Lab 11/02/22 0645 11/02/22 0708 11/03/22 0514  WBC 3.3*  --  10.3  NEUTROABS 2.5  --   --   HGB 13.6 13.9 10.4*  HCT 41.8 41.0 34.3*  MCV 91.5  --  95.3  PLT 236  --  190   BMP &GFR Recent Labs  Lab 11/02/22 0645 11/02/22 0708 11/03/22 0514  NA 140 143 139  K 3.7  3.9 4.0  CL 106 107 110  CO2 23  --  23  GLUCOSE 120* 116* 117*  BUN 14 14 14   CREATININE 0.84 0.60 0.88  CALCIUM 8.1*  --  7.6*   Estimated Creatinine Clearance: 89 mL/min (by C-G formula based on SCr of 0.88 mg/dL). Liver & Pancreas: Recent Labs  Lab 11/02/22 0645  AST 41  ALT 35  ALKPHOS 94  BILITOT 0.3  PROT 6.1*  ALBUMIN 3.2*   Recent Labs  Lab 11/02/22 0645  LIPASE 25   No results for input(s): "AMMONIA" in the last 168 hours. Diabetic: No results for input(s): "HGBA1C" in the last 72 hours. No results for input(s): "GLUCAP" in the last 168 hours. Cardiac Enzymes: No results for input(s): "CKTOTAL", "CKMB", "CKMBINDEX", "TROPONINI" in the last 168 hours. No results for input(s): "PROBNP" in the last 8760 hours. Coagulation Profile: No results for input(s): "INR", "PROTIME" in the last 168 hours. Thyroid Function Tests: No results for input(s): "TSH", "T4TOTAL", "FREET4", "T3FREE", "THYROIDAB" in the last 72  hours. Lipid Profile: No results for input(s): "CHOL", "HDL", "LDLCALC", "TRIG", "CHOLHDL", "LDLDIRECT" in the last 72 hours. Anemia Panel: No results for input(s): "VITAMINB12", "FOLATE", "FERRITIN", "TIBC", "IRON", "RETICCTPCT" in the last 72 hours. Urine analysis:    Component Value Date/Time   COLORURINE YELLOW 08/08/2010 1325   APPEARANCEUR Clear 11/16/2016 1649   LABSPEC 1.010 11/16/2012 1439   PHURINE 7.0 11/16/2012 1439   GLUCOSEU Negative 11/16/2016 1649   HGBUR NEGATIVE 11/16/2012 1439   BILIRUBINUR Negative 02/18/2021 1629   BILIRUBINUR Negative 11/16/2016 1649   KETONESUR NEGATIVE 11/16/2012 1439   PROTEINUR Negative 02/18/2021 1629   PROTEINUR Negative 11/16/2016 1649   PROTEINUR NEGATIVE 11/16/2012 1439   UROBILINOGEN 1.0 02/18/2021 1629   UROBILINOGEN 0.2 11/16/2012 1439   NITRITE Negative 02/18/2021 1629   NITRITE Negative 11/16/2016 1649   NITRITE NEGATIVE 11/16/2012 1439   LEUKOCYTESUR Negative 02/18/2021 1629   LEUKOCYTESUR Negative 11/16/2016 1649   Sepsis Labs: Invalid input(s): "PROCALCITONIN", "LACTICIDVEN"  Microbiology: Recent Results (from the past 240 hour(s))  Culture, blood (routine x 2)     Status: None (Preliminary result)   Collection Time: 11/02/22  5:50 AM   Specimen: BLOOD  Result Value Ref Range Status   Specimen Description BLOOD LEFT ANTECUBITAL  Final   Special Requests   Final    BOTTLES DRAWN AEROBIC AND ANAEROBIC Blood Culture results may not be optimal due to an excessive volume of blood received in culture bottles   Culture   Final    NO GROWTH 1 DAY Performed at [facility], [place] [address], [place], [place] [address]    Report Status PENDING  Incomplete  Resp panel by RT-PCR (RSV, Flu A&B, Covid) Anterior Nasal Swab     Status: None   Collection Time: 11/02/22  9:55 AM   Specimen: Anterior Nasal Swab  Result Value Ref Range Status   SARS Coronavirus 2 by RT PCR NEGATIVE NEGATIVE Final    Comment: (NOTE) SARS-CoV-2  target nucleic acids are NOT DETECTED.  The SARS-CoV-2 RNA is generally detectable in upper respiratory specimens during the acute phase of infection. The lowest concentration of SARS-CoV-2 viral copies this assay can detect is 138 copies/mL. A negative result does not preclude SARS-Cov-2 infection and should not be used as the sole basis for treatment or other patient management decisions. A negative result may occur with  improper specimen collection/handling, submission of specimen other than nasopharyngeal swab, presence of viral mutation(s) within the  areas targeted by this assay, and inadequate number of viral copies(<138 copies/mL). A negative result must be combined with clinical observations, patient history, and epidemiological information. The expected result is Negative.  Fact Sheet for Patients:  BloggerCourse.com  Fact Sheet for Healthcare Providers:  SeriousBroker.it  This test is no t yet approved or cleared by the [place] FDA and  has been authorized for detection and/or diagnosis of SARS-CoV-2 by FDA under an Emergency Use Authorization (EUA). This EUA will remain  in effect (meaning this test can be used) for the duration of the COVID-19 declaration under Section 564(b)(1) of the Act, 21 U.S.C.section 360bbb-3(b)(1), unless the authorization is terminated  or revoked sooner.       Influenza A by PCR NEGATIVE NEGATIVE Final   Influenza B by PCR NEGATIVE NEGATIVE Final    Comment: (NOTE) The Xpert Xpress SARS-CoV-2/FLU/RSV plus assay is intended as an aid in the diagnosis of influenza from Nasopharyngeal swab specimens and should not be used as a sole basis for treatment. Nasal washings and aspirates are unacceptable for Xpert Xpress SARS-CoV-2/FLU/RSV testing.  Fact Sheet for Patients: BloggerCourse.com  Fact Sheet for Healthcare  Providers: SeriousBroker.it  This test is not yet approved or cleared by the [place] FDA and has been authorized for detection and/or diagnosis of SARS-CoV-2 by FDA under an Emergency Use Authorization (EUA). This EUA will remain in effect (meaning this test can be used) for the duration of the COVID-19 declaration under Section 564(b)(1) of the Act, 21 U.S.C. section 360bbb-3(b)(1), unless the authorization is terminated or revoked.     Resp Syncytial Virus by PCR NEGATIVE NEGATIVE Final    Comment: (NOTE) Fact Sheet for Patients: BloggerCourse.com  Fact Sheet for Healthcare Providers: SeriousBroker.it  This test is not yet approved or cleared by the [place] FDA and has been authorized for detection and/or diagnosis of SARS-CoV-2 by FDA under an Emergency Use Authorization (EUA). This EUA will remain in effect (meaning this test can be used) for the duration of the COVID-19 declaration under Section 564(b)(1) of the Act, 21 U.S.C. section 360bbb-3(b)(1), unless the authorization is terminated or revoked.  Performed at [facility], [address], [place], [place] [address]     Radiology Studies: CT Abdomen Pelvis W Contrast  Result Date: 11/02/2022 CLINICAL DATA:  Abdominal pain, shortness of breath EXAM: CT ABDOMEN AND PELVIS WITH CONTRAST TECHNIQUE: Multidetector CT imaging of the abdomen and pelvis was performed using the standard protocol following bolus administration of intravenous contrast. RADIATION DOSE REDUCTION: This exam was performed according to the departmental dose-optimization program which includes automated exposure control, adjustment of the mA and/or kV according to patient size and/or use of iterative reconstruction technique. CONTRAST:  42mL OMNIPAQUE IOHEXOL 350 MG/ML SOLN COMPARISON:  None Available. FINDINGS: Lower chest: Please see separately reported  examination of the abdomen and pelvis. Hepatobiliary: No solid liver abnormality is seen. No gallstones, gallbladder wall thickening, or biliary dilatation. Pancreas: Unremarkable. No pancreatic ductal dilatation or surrounding inflammatory changes. Spleen: Normal in size without significant abnormality. Adrenals/Urinary Tract: Adrenal glands are unremarkable. Kidneys are normal, without renal calculi, solid lesion, or hydronephrosis. Bladder is unremarkable. Stomach/Bowel: Stomach is within normal limits. Appendix appears normal. No evidence of bowel wall thickening, distention, or inflammatory changes. Moderate burden of stool throughout the colon. Vascular/Lymphatic: No significant vascular findings are present. No enlarged abdominal or pelvic lymph nodes. Reproductive: No mass or other significant abnormality. Other: No abdominal wall hernia or abnormality. No ascites. Musculoskeletal: No  acute or significant osseous findings. IMPRESSION: 1. No acute CT findings of the abdomen or pelvis to explain abdominal pain. 2. Moderate burden of stool throughout the colon. Electronically Signed   By: Jearld Lesch M.D.   On: 11/02/2022 11:47   CT Angio Chest PE W/Cm &/Or Wo Cm  Result Date: 11/02/2022 CLINICAL DATA:  Shortness of breath, PE suspected EXAM: CT ANGIOGRAPHY CHEST WITH CONTRAST TECHNIQUE: Multidetector CT imaging of the chest was performed using the standard protocol during bolus administration of intravenous contrast. Multiplanar CT image reconstructions and MIPs were obtained to evaluate the vascular anatomy. RADIATION DOSE REDUCTION: This exam was performed according to the departmental dose-optimization program which includes automated exposure control, adjustment of the mA and/or kV according to patient size and/or use of iterative reconstruction technique. CONTRAST:  52mL OMNIPAQUE IOHEXOL 350 MG/ML SOLN COMPARISON:  None Available. FINDINGS: Cardiovascular: Satisfactory opacification of the  pulmonary arteries to the segmental level. No evidence of pulmonary embolism. Normal heart size. No pericardial effusion. Mediastinum/Nodes: Numerous prominent mediastinal and bilateral hilar lymph nodes. Thyroid gland, trachea, and esophagus demonstrate no significant findings. Lungs/Pleura: Diffuse bilateral ground-glass attenuation of the airspaces, with extensive heterogeneous and consolidative airspace opacity throughout the dependent lungs. Numerous thin walled centrilobular and paraseptal cysts of varying sizes, which are seen throughout the lungs although concentrated in the apices. Tracheobronchomalacia (series 9, image 43). No pleural effusion or pneumothorax. Upper Abdomen: Please see separately reported examination of the abdomen and pelvis. Musculoskeletal: No chest wall abnormality. No acute osseous findings. 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 Review of the MIP images confirms the above findings. IMPRESSION: 1. Negative examination for pulmonary embolism. 2. Diffuse bilateral ground-glass attenuation of the airspaces, with extensive heterogeneous and consolidative airspace opacity throughout the dependent lungs. Findings are consistent with extensive multifocal infection and/or aspiration. 3. Numerous thin walled centrilobular and paraseptal cysts of varying sizes, which are seen throughout the lungs although concentrated in the apices. This most likely reflects a combination of emphysema and postinfectious or inflammatory pneumatoceles, although in general cystic lung disease such as desquamative interstitial pneumonitis, lymphangioleiomyomatosis or lymphocytic interstitial pneumonitis are differential considerations. 4. Tracheobronchomalacia. 5. Numerous prominent mediastinal and bilateral hilar lymph nodes, likely reactive. Electronically Signed   By: Jearld Lesch M.D.   On: 11/02/2022 11:40      Chrisa Hassan T. Rhonda Linan [facility]  If 7PM-7AM, please  contact night-coverage [email or web address] 11/03/2022, 11:08 AM

## 2022-11-03 NOTE — ED Notes (Signed)
Pt in room A&O x4. Pt ambulated to restroom with a steady gait. Updated on plan of care. Call bell within reach. Pt denies any other needs.

## 2022-11-03 NOTE — Progress Notes (Signed)
New patient from the ed.Patient denies complaints.S. MP shows NSR. Patient is on 02 at 2l  nasal cannula. Patient stadby assist. Patient up to the bathroom and voided.

## 2022-11-03 NOTE — ED Notes (Signed)
Gave patient a [place] sandwich patient is resting with call bell in reach and family at bedside

## 2022-11-03 NOTE — ED Notes (Signed)
Notified Alcario Drought, MD of patients new swelling to hands and feet. Patient VSS and in NAD at this time.

## 2022-11-03 NOTE — ED Notes (Signed)
ED TO INPATIENT HANDOFF REPORT  ED Nurse Name and Phone #: Tamel Abel  77  S Name/Age/Gender [name] 47 y.o. female Room/Bed: 008C/008C  Code Status   Code Status: Full Code  Home/SNF/Other Home Patient oriented to: self, place, time, and situation Is this baseline? Yes   Triage Complete: Triage complete  Chief Complaint Multifocal pneumonia [J18.9]  Triage Note Patient BIB [facility] c/o sudden onset of shob that started this morning that led to patient coughing up "bloody sputum."  Patient diagnosed with the flu approx 1 week ago.  EMS reports patient was 76% on RA, and they put her on 6L [place] that brought her up to 93%. Patient has been having spells off and on to where she couldn't breathe but she reports this morning that she couldn't breathe at all, so she called 911.  Upon arrival, patient also reports that she's been having bloody vomit as well.  186/80 120 HR    Allergies No Active Allergies  Level of Care/Admitting Diagnosis ED Disposition     ED Disposition  Admit   Condition  --   Comment  Hospital Area: [facility] [[id number]]  Level of Care: Progressive [102]  Admit to Progressive based on following criteria: RESPIRATORY PROBLEMS hypoxemic/hypercapnic respiratory failure that is responsive to NIPPV (BiPAP) or High Flow Nasal Cannula (6-80 lpm). Frequent assessment/intervention, no > Q2 hrs < Q4 hrs, to maintain oxygenation and pulmonary hygiene.  Admit to Progressive based on following criteria: MULTISYSTEM THREATS such as stable sepsis, metabolic/electrolyte imbalance with or without encephalopathy that is responding to early treatment.  May admit patient to [facility] or [facility] if equivalent level of care is available:: Yes  Covid Evaluation: Confirmed COVID Negative  Diagnosis: Multifocal pneumonia [[id number]]  Admitting Physician: Jonah Blue [[id number]]  Attending Physician: Jonah Blue [[id number]]  Certification:: I certify this  patient will need inpatient services for at least 2 midnights  Estimated Length of Stay: 4          B Medical/Surgery History Past Medical History:  Diagnosis Date   Asthma    Carpal tunnel syndrome    CIN III (cervical intraepithelial neoplasia III) 2017   Seen on colposcopy. LEEP actually showed CIN II with negative margins   COPD (chronic obstructive pulmonary disease) (HCC)    Hemorrhoids    Iron deficiency anemia    Sjogren's syndrome (HCC)    Past Surgical History:  Procedure Laterality Date   CESAREAN SECTION     Tubal ligation       A IV Location/Drains/Wounds Patient Lines/Drains/Airways Status     Active Line/Drains/Airways     Name Placement date Placement time Site Days   Peripheral IV 11/02/22 22 G Left Antecubital 11/02/22  2112  Antecubital  1            Intake/Output Last 24 hours  Intake/Output Summary (Last 24 hours) at 11/03/2022 1604 Last data filed at 11/03/2022 8127 Gross per 24 hour  Intake 3310.83 ml  Output --  Net 3310.83 ml    Labs/Imaging Results for orders placed or performed during the hospital encounter of 11/02/22 (from the past 48 hour(s))  Culture, blood (routine x 2)     Status: None (Preliminary result)   Collection Time: 11/02/22  5:50 AM   Specimen: BLOOD  Result Value Ref Range   Specimen Description BLOOD LEFT ANTECUBITAL    Special Requests      BOTTLES DRAWN AEROBIC AND ANAEROBIC Blood Culture results may not be  optimal due to an excessive volume of blood received in culture bottles   Culture      NO GROWTH 1 DAY Performed at [facility], [address] [place], [place] [address]    Report Status PENDING   Comprehensive metabolic panel     Status: Abnormal   Collection Time: 11/02/22  6:45 AM  Result Value Ref Range   Sodium 140 135 - 145 mmol/L   Potassium 3.7 3.5 - 5.1 mmol/L   Chloride 106 98 - 111 mmol/L   CO2 23 22 - 32 mmol/L   Glucose, Bld 120 (H) 70 - 99 mg/dL    Comment: Glucose  reference range applies only to samples taken after fasting for at least 8 hours.   BUN 14 6 - 20 mg/dL   Creatinine, Ser 6.040.84 0.44 - 1.00 mg/dL   Calcium 8.1 (L) 8.9 - 10.3 mg/dL   Total Protein 6.1 (L) 6.5 - 8.1 g/dL   Albumin 3.2 (L) 3.5 - 5.0 g/dL   AST 41 15 - 41 U/L   ALT 35 0 - 44 U/L   Alkaline Phosphatase 94 38 - 126 U/L   Total Bilirubin 0.3 0.3 - 1.2 mg/dL   GFR, Estimated >54>60 >09>60 mL/min    Comment: (NOTE) Calculated using the CKD-EPI Creatinine Equation (2021)    Anion gap 11 5 - 15    Comment: Performed at [facility], [address] [place], [place] [address]  CBC with Differential     Status: Abnormal   Collection Time: 11/02/22  6:45 AM  Result Value Ref Range   WBC 3.3 (L) 4.0 - 10.5 K/uL   RBC 4.57 3.87 - 5.11 MIL/uL   Hemoglobin 13.6 12.0 - 15.0 g/dL   HCT 47.841.8 29.536.0 - 62.146.0 %   MCV 91.5 80.0 - 100.0 fL   MCH 29.8 26.0 - 34.0 pg   MCHC 32.5 30.0 - 36.0 g/dL   RDW 30.814.0 65.711.5 - 84.615.5 %   Platelets 236 150 - 400 K/uL   nRBC 0.0 0.0 - 0.2 %   Neutrophils Relative % 76 %   Neutro Abs 2.5 1.7 - 7.7 K/uL   Lymphocytes Relative 16 %   Lymphs Abs 0.5 (L) 0.7 - 4.0 K/uL   Monocytes Relative 4 %   Monocytes Absolute 0.1 0.1 - 1.0 K/uL   Eosinophils Relative 3 %   Eosinophils Absolute 0.1 0.0 - 0.5 K/uL   Basophils Relative 1 %   Basophils Absolute 0.0 0.0 - 0.1 K/uL   Immature Granulocytes 0 %   Abs Immature Granulocytes 0.00 0.00 - 0.07 K/uL    Comment: Performed at [facility], [address] [place], [place] [address]  Troponin I (High Sensitivity)     Status: None   Collection Time: 11/02/22  6:45 AM  Result Value Ref Range   Troponin I (High Sensitivity) 8 <18 ng/L    Comment: (NOTE) Elevated high sensitivity troponin I (hsTnI) values and significant  changes across serial measurements may suggest ACS but many other  chronic and acute conditions are known to elevate hsTnI results.  Refer to the "Links" section for chest pain algorithms and  additional  guidance. Performed at [facility], [address] [place], [place] [address]   Lipase, blood     Status: None   Collection Time: 11/02/22  6:45 AM  Result Value Ref Range   Lipase 25 11 - 51 U/L    Comment: Performed  at [facility], [address] [place], [place] [address]  Brain natriuretic peptide     Status: None   Collection Time: 11/02/22  6:45 AM  Result Value Ref [place]   B Natriuretic Peptide 80.4 0.0 - 100.0 pg/mL    Comment: Performed at [facility], [address] [place], [place] [address]  I-stat chem 8, ED     Status: Abnormal   Collection Time: 11/02/22  7:08 AM  Result Value Ref Range   Sodium 143 135 - 145 mmol/L   Potassium 3.9 3.5 - 5.1 mmol/L   Chloride 107 98 - 111 mmol/L   BUN 14 6 - 20 mg/dL   Creatinine, Ser 0.980.60 0.44 - 1.00 mg/dL   Glucose, Bld 119116 (H) 70 - 99 mg/dL    Comment: Glucose reference range applies only to samples taken after fasting for at least 8 hours.   Calcium, Ion 1.15 1.15 - 1.40 mmol/L   TCO2 24 22 - 32 mmol/L   Hemoglobin 13.9 12.0 - 15.0 g/dL   HCT 14.741.0 82.936.0 - 56.246.0 %  Resp panel by RT-PCR (RSV, Flu A&B, Covid) Anterior Nasal Swab     Status: None   Collection Time: 11/02/22  9:55 AM   Specimen: Anterior Nasal Swab  Result Value Ref Range   SARS Coronavirus 2 by RT PCR NEGATIVE NEGATIVE    Comment: (NOTE) SARS-CoV-2 target nucleic acids are NOT DETECTED.  The SARS-CoV-2 RNA is generally detectable in upper respiratory specimens during the acute phase of infection. The lowest concentration of SARS-CoV-2 viral copies this assay can detect is 138 copies/mL. A negative result does not preclude SARS-Cov-2 infection and should not be used as the sole basis for treatment or other patient management decisions. A negative result may occur with  improper specimen collection/handling, submission of specimen other than nasopharyngeal swab, presence of viral mutation(s) within the areas targeted by this  assay, and inadequate number of viral copies(<138 copies/mL). A negative result must be combined with clinical observations, patient history, and epidemiological information. The expected result is Negative.  Fact Sheet for Patients:  BloggerCourse.comhttps://www.fda.gov/media/152166/download  Fact Sheet for Healthcare Providers:  SeriousBroker.ithttps://www.fda.gov/media/152162/download  This test is no t yet approved or cleared by the [place] FDA and  has been authorized for detection and/or diagnosis of SARS-CoV-2 by FDA under an Emergency Use Authorization (EUA). This EUA will remain  in effect (meaning this test can be used) for the duration of the COVID-19 declaration under Section 564(b)(1) of the Act, 21 U.S.C.section 360bbb-3(b)(1), unless the authorization is terminated  or revoked sooner.       Influenza A by PCR NEGATIVE NEGATIVE   Influenza B by PCR NEGATIVE NEGATIVE    Comment: (NOTE) The Xpert Xpress SARS-CoV-2/FLU/RSV plus assay is intended as an aid in the diagnosis of influenza from Nasopharyngeal swab specimens and should not be used as a sole basis for treatment. Nasal washings and aspirates are unacceptable for Xpert Xpress SARS-CoV-2/FLU/RSV testing.  Fact Sheet for Patients: BloggerCourse.comhttps://www.fda.gov/media/152166/download  Fact Sheet for Healthcare Providers: SeriousBroker.ithttps://www.fda.gov/media/152162/download  This test is not yet approved or cleared by the [place] FDA and has been authorized for detection and/or diagnosis of SARS-CoV-2 by FDA under an Emergency Use Authorization (EUA). This EUA will remain in effect (meaning this test can be used) for the duration of the COVID-19 declaration under Section 564(b)(1) of the Act, 21 U.S.C. section 360bbb-3(b)(1), unless the authorization is terminated or revoked.     Resp Syncytial Virus  by PCR NEGATIVE NEGATIVE    Comment: (NOTE) Fact Sheet for Patients: BloggerCourse.com  Fact Sheet for Healthcare  Providers: SeriousBroker.it  This test is not yet approved or cleared by the [place] FDA and has been authorized for detection and/or diagnosis of SARS-CoV-2 by FDA under an Emergency Use Authorization (EUA). This EUA will remain in effect (meaning this test can be used) for the duration of the COVID-19 declaration under Section 564(b)(1) of the Act, 21 U.S.C. section 360bbb-3(b)(1), unless the authorization is terminated or revoked.  Performed at [facility], [address], [place], [place] [address]   I-Stat beta hCG blood, ED     Status: Abnormal   Collection Time: 11/02/22 10:42 AM  Result Value Ref Range   I-stat hCG, quantitative 7.5 (H) <5 mIU/mL   Comment 3            Comment:   GEST. AGE      CONC.  (mIU/mL)   <=1 WEEK        5 - 50     2 WEEKS       50 - 500     3 WEEKS       100 - 10,000     4 WEEKS     1,000 - 30,000        FEMALE AND NON-PREGNANT FEMALE:     LESS THAN 5 mIU/mL   Lactic acid, plasma     Status: Abnormal   Collection Time: 11/02/22 11:47 AM  Result Value Ref Range   Lactic Acid, Venous 3.1 (HH) 0.5 - 1.9 mmol/L    Comment: CRITICAL RESULT CALLED TO, READ BACK BY AND VERIFIED WITH Myriam Jacobson, RN @ [phone] 11/02/22 BY [facility] Performed at [facility], [address] [place], [place] [address]   Troponin I (High Sensitivity)     Status: None   Collection Time: 11/02/22 11:47 AM  Result Value Ref Range   Troponin I (High Sensitivity) 12 <18 ng/L    Comment: (NOTE) Elevated high sensitivity troponin I (hsTnI) values and significant  changes across serial measurements may suggest ACS but many other  chronic and acute conditions are known to elevate hsTnI results.  Refer to the "Links" section for chest pain algorithms and additional  guidance. Performed at [facility], [address] [place], [place] [address]   Legionella Pneumophila Serogp 1 Ur Ag     Status: None   Collection Time: 11/02/22  12:43 PM  Result Value Ref Range   L. pneumophila Serogp 1 Ur Ag Negative Negative    Comment: (NOTE) Presumptive negative for L. pneumophila serogroup 1 antigen in urine, suggesting no recent or current infection. Legionnaires' disease cannot be ruled out since other serogroups and species may also cause disease. Performed At: [facility] [address] [place], [place] [id number] Jolene Schimke MD QM:[id number]    Source of Sample URINE, RANDOM     Comment: Performed at [facility], [address], [place], [place] [address]  Strep pneumoniae urinary antigen     Status: None   Collection Time: 11/02/22 12:43 PM  Result Value Ref Range   Strep Pneumo Urinary Antigen NEGATIVE NEGATIVE    Comment:        Infection due to S. pneumoniae cannot be absolutely ruled out since the antigen present may be below the detection limit of the test. Performed at [facility], [address], [place], [place] [address]  HIV Antibody (routine testing w rflx)     Status: None   Collection Time: 11/03/22  5:14 AM  Result Value Ref Range   HIV Screen 4th Generation wRfx Non Reactive Non Reactive    Comment: Performed at [facility], [address] [place], [place] [address]  Basic metabolic panel     Status: Abnormal   Collection Time: 11/03/22  5:14 AM  Result Value Ref Range   Sodium 139 135 - 145 mmol/L   Potassium 4.0 3.5 - 5.1 mmol/L   Chloride 110 98 - 111 mmol/L   CO2 23 22 - 32 mmol/L   Glucose, Bld 117 (H) 70 - 99 mg/dL    Comment: Glucose reference range applies only to samples taken after fasting for at least 8 hours.   BUN 14 6 - 20 mg/dL   Creatinine, Ser 0.96 0.44 - 1.00 mg/dL   Calcium 7.6 (L) 8.9 - 10.3 mg/dL   GFR, Estimated >28 >36 mL/min    Comment: (NOTE) Calculated using the CKD-EPI Creatinine Equation (2021)    Anion gap 6 5 - 15    Comment: Performed at [facility], [address], [place], [place] [address]  CBC     Status:  Abnormal   Collection Time: 11/03/22  5:14 AM  Result Value Ref Range   WBC 10.3 4.0 - 10.5 K/uL   RBC 3.60 (L) 3.87 - 5.11 MIL/uL   Hemoglobin 10.4 (L) 12.0 - 15.0 g/dL   HCT 65.4 (L) 65.0 - 35.4 %   MCV 95.3 80.0 - 100.0 fL   MCH 28.9 26.0 - 34.0 pg   MCHC 30.3 30.0 - 36.0 g/dL   RDW 65.6 81.2 - 75.1 %   Platelets 190 150 - 400 K/uL   nRBC 0.0 0.0 - 0.2 %    Comment: Performed at [facility], [address] [place], [place] [address]  Procalcitonin     Status: None   Collection Time: 11/03/22  5:14 AM  Result Value Ref Range   Procalcitonin 15.74 ng/mL    Comment:        Interpretation: PCT >= 10 ng/mL: Important systemic inflammatory response, almost exclusively due to severe bacterial sepsis or septic shock. (NOTE)       Sepsis PCT Algorithm           Lower Respiratory Tract                                      Infection PCT Algorithm    ----------------------------     ----------------------------         PCT < 0.25 ng/mL                PCT < 0.10 ng/mL          Strongly encourage             Strongly discourage   discontinuation of antibiotics    initiation of antibiotics    ----------------------------     -----------------------------       PCT 0.25 - 0.50 ng/mL            PCT 0.10 - 0.25 ng/mL               OR       >80% decrease in PCT            Discourage initiation of  antibiotics      Encourage discontinuation           of antibiotics    ----------------------------     -----------------------------         PCT >= 0.50 ng/mL              PCT 0.26 - 0.50 ng/mL                AND       <80% decrease in PCT             Encourage initiation of                                             antibiotics       Encourage continuation           of antibiotics    ----------------------------     -----------------------------        PCT >= 0.50 ng/mL                  PCT > 0.50 ng/mL               AND         increase  in PCT                  Strongly encourage                                      initiation of antibiotics    Strongly encourage escalation           of antibiotics                                     -----------------------------                                           PCT <= 0.25 ng/mL                                                 OR                                        > 80% decrease in PCT                                      Discontinue / Do not initiate                                             antibiotics  Performed at [facility], [address], [place], [place] [address]    CT Abdomen Pelvis W Contrast  Result Date: 11/02/2022 CLINICAL  DATA:  Abdominal pain, shortness of breath EXAM: CT ABDOMEN AND PELVIS WITH CONTRAST TECHNIQUE: Multidetector CT imaging of the abdomen and pelvis was performed using the standard protocol following bolus administration of intravenous contrast. RADIATION DOSE REDUCTION: This exam was performed according to the departmental dose-optimization program which includes automated exposure control, adjustment of the mA and/or kV according to patient size and/or use of iterative reconstruction technique. CONTRAST:  50mL OMNIPAQUE IOHEXOL 350 MG/ML SOLN COMPARISON:  None Available. FINDINGS: Lower chest: Please see separately reported examination of the abdomen and pelvis. Hepatobiliary: No solid liver abnormality is seen. No gallstones, gallbladder wall thickening, or biliary dilatation. Pancreas: Unremarkable. No pancreatic ductal dilatation or surrounding inflammatory changes. Spleen: Normal in size without significant abnormality. Adrenals/Urinary Tract: Adrenal glands are unremarkable. Kidneys are normal, without renal calculi, solid lesion, or hydronephrosis. Bladder is unremarkable. Stomach/Bowel: Stomach is within normal limits. Appendix appears normal. No evidence of bowel wall thickening, distention, or inflammatory changes. Moderate burden of  stool throughout the colon. Vascular/Lymphatic: No significant vascular findings are present. No enlarged abdominal or pelvic lymph nodes. Reproductive: No mass or other significant abnormality. Other: No abdominal wall hernia or abnormality. No ascites. Musculoskeletal: No acute or significant osseous findings. IMPRESSION: 1. No acute CT findings of the abdomen or pelvis to explain abdominal pain. 2. Moderate burden of stool throughout the colon. Electronically Signed   By: Delanna Ahmadi M.D.   On: 11/02/2022 11:47   CT Angio Chest PE W/Cm &/Or Wo Cm  Result Date: 11/02/2022 CLINICAL DATA:  Shortness of breath, PE suspected EXAM: CT ANGIOGRAPHY CHEST WITH CONTRAST TECHNIQUE: Multidetector CT imaging of the chest was performed using the standard protocol during bolus administration of intravenous contrast. Multiplanar CT image reconstructions and MIPs were obtained to evaluate the vascular anatomy. RADIATION DOSE REDUCTION: This exam was performed according to the departmental dose-optimization program which includes automated exposure control, adjustment of the mA and/or kV according to patient size and/or use of iterative reconstruction technique. CONTRAST:  46mL OMNIPAQUE IOHEXOL 350 MG/ML SOLN COMPARISON:  None Available. FINDINGS: Cardiovascular: Satisfactory opacification of the pulmonary arteries to the segmental level. No evidence of pulmonary embolism. Normal heart size. No pericardial effusion. Mediastinum/Nodes: Numerous prominent mediastinal and bilateral hilar lymph nodes. Thyroid gland, trachea, and esophagus demonstrate no significant findings. Lungs/Pleura: Diffuse bilateral ground-glass attenuation of the airspaces, with extensive heterogeneous and consolidative airspace opacity throughout the dependent lungs. Numerous thin walled centrilobular and paraseptal cysts of varying sizes, which are seen throughout the lungs although concentrated in the apices. Tracheobronchomalacia (series 9, image  43). No pleural effusion or pneumothorax. Upper Abdomen: Please see separately reported examination of the abdomen and pelvis. Musculoskeletal: No chest wall abnormality. No acute osseous findings. 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 Review of the MIP images confirms the above findings. IMPRESSION: 1. Negative examination for pulmonary embolism. 2. Diffuse bilateral ground-glass attenuation of the airspaces, with extensive heterogeneous and consolidative airspace opacity throughout the dependent lungs. Findings are consistent with extensive multifocal infection and/or aspiration. 3. Numerous thin walled centrilobular and paraseptal cysts of varying sizes, which are seen throughout the lungs although concentrated in the apices. This most likely reflects a combination of emphysema and postinfectious or inflammatory pneumatoceles, although in general cystic lung disease such as desquamative interstitial pneumonitis, lymphangioleiomyomatosis or lymphocytic interstitial pneumonitis are differential considerations. 4. Tracheobronchomalacia. 5. Numerous prominent mediastinal and bilateral hilar lymph nodes, likely reactive. Electronically Signed   By: Delanna Ahmadi M.D.   On:  11/02/2022 11:40   DG Chest Port 1 View  Result Date: 11/02/2022 CLINICAL DATA:  Shortness of breath and hemoptysis. EXAM: PORTABLE CHEST 1 VIEW COMPARISON:  05/11/2021 FINDINGS: 0546 hours. The cardio pericardial silhouette is enlarged. There is pulmonary vascular congestion without overt pulmonary edema. Patchy airspace disease noted both lung bases. The visualized bony structures of the thorax are unremarkable. Telemetry leads overlie the chest. IMPRESSION: 1. Enlargement of the cardiopericardial silhouette with pulmonary vascular congestion. 2. Patchy bibasilar airspace disease pneumonia compatible with multifocal pneumonia. Electronically Signed   By: Misty Stanley M.D.   On: 11/02/2022 06:09     Pending Labs Unresulted Labs (From admission, onward)     Start     Ordered   11/04/22 0500  Renal function panel  Tomorrow morning,   R        11/03/22 1109   11/04/22 0500  Magnesium  Tomorrow morning,   R        11/03/22 1109   11/04/22 0500  CBC  Tomorrow morning,   R        11/03/22 1109   11/04/22 0500  Vitamin B12  (Anemia Panel (PNL))  Tomorrow morning,   R        11/03/22 1109   11/04/22 0500  Folate  (Anemia Panel (PNL))  Tomorrow morning,   R        11/03/22 1109   11/04/22 0500  Iron and TIBC  (Anemia Panel (PNL))  Tomorrow morning,   R        11/03/22 1109   11/04/22 0500  Ferritin  (Anemia Panel (PNL))  Tomorrow morning,   R        11/03/22 1109   11/04/22 0500  Reticulocytes  (Anemia Panel (PNL))  Tomorrow morning,   R        11/03/22 1109   11/03/22 0500  Procalcitonin  Daily,   R      11/02/22 1243   11/02/22 1242  Expectorated Sputum Assessment w Gram Stain, Rflx to Resp Cult  (COPD / Pneumonia / Cellulitis / Lower Extremity Wound)  Once,   R        11/02/22 1243   11/02/22 0550  Culture, blood (routine x 2)  BLOOD CULTURE X 2,   R (with STAT occurrences)      11/02/22 0549   11/02/22 0539  Lactic acid, plasma  Now then every 2 hours,   R (with STAT occurrences)      11/02/22 0539            Vitals/Pain Today's Vitals   11/03/22 0700 11/03/22 0753 11/03/22 1225 11/03/22 1500  BP: 113/64  (!) 100/58 (!) 90/56  Pulse: (!) 101  (!) 101 98  Resp: 12  19 10   Temp:  97.7 F (36.5 C) 97.8 F (36.6 C)   TempSrc:  Oral Oral   SpO2: 95%  96% 95%  Weight:      Height:      PainSc:        Isolation Precautions No active isolations  Medications Medications  cefTRIAXone (ROCEPHIN) 2 g in sodium chloride 0.9 % 100 mL IVPB (0 g Intravenous Stopped 11/03/22 0646)  azithromycin (ZITHROMAX) 500 mg in sodium chloride 0.9 % 250 mL IVPB (0 mg Intravenous Stopped 11/03/22 0753)  enoxaparin (LOVENOX) injection 40 mg (40 mg Subcutaneous Given 11/02/22 1406)   acetaminophen (TYLENOL) tablet 650 mg (has no administration in time range)    Or  acetaminophen (TYLENOL) suppository 650  mg (has no administration in time range)  docusate sodium (COLACE) capsule 100 mg (100 mg Oral Given 11/03/22 0927)  polyethylene glycol (MIRALAX / GLYCOLAX) packet 17 g (has no administration in time range)  bisacodyl (DULCOLAX) EC tablet 5 mg (has no administration in time range)  ondansetron (ZOFRAN) tablet 4 mg (has no administration in time range)    Or  ondansetron (ZOFRAN) injection 4 mg (has no administration in time range)  guaiFENesin (MUCINEX) 12 hr tablet 600 mg (has no administration in time range)  hydrALAZINE (APRESOLINE) injection 5 mg (has no administration in time range)  sodium chloride flush (NS) 0.9 % injection 3 mL (3 mLs Intravenous Given 11/03/22 0927)  atorvastatin (LIPITOR) tablet 40 mg (40 mg Oral Given 11/03/22 0927)  DULoxetine (CYMBALTA) DR capsule 60 mg (60 mg Oral Given 11/03/22 0927)  fluticasone (FLONASE) 50 MCG/ACT nasal spray 2 spray (2 sprays Each Nare Given 11/03/22 0928)  mirtazapine (REMERON) tablet 15 mg (15 mg Oral Given 11/02/22 2110)  lamoTRIgine (LAMICTAL) tablet 50 mg (50 mg Oral Given 11/03/22 0927)  gabapentin (NEURONTIN) capsule 400 mg (400 mg Oral Given 11/03/22 0927)  umeclidinium bromide (INCRUSE ELLIPTA) 62.5 MCG/ACT 1 puff (1 puff Inhalation Given 11/03/22 0928)  tiZANidine (ZANAFLEX) tablet 2 mg (2 mg Oral Given 11/03/22 0927)  ipratropium-albuterol (DUONEB) 0.5-2.5 (3) MG/3ML nebulizer solution 3 mL (has no administration in time range)  mometasone-formoterol (DULERA) 200-5 MCG/ACT inhaler 2 puff (2 puffs Inhalation Given 11/03/22 1400)  HYDROcodone-acetaminophen (NORCO/VICODIN) 5-325 MG per tablet 1 tablet (has no administration in time range)  sodium chloride 0.9 % bolus 500 mL (0 mLs Intravenous Stopped 11/02/22 1049)  methylPREDNISolone sodium succinate (SOLU-MEDROL) 125 mg/2 mL injection 125 mg (125 mg Intravenous Given  11/02/22 0553)  ipratropium-albuterol (DUONEB) 0.5-2.5 (3) MG/3ML nebulizer solution 3 mL (3 mLs Nebulization Given 11/02/22 0817)  benzonatate (TESSALON) capsule 200 mg (200 mg Oral Given 11/02/22 0552)  ondansetron (ZOFRAN) injection 4 mg (4 mg Intravenous Given 11/02/22 0553)  cefTRIAXone (ROCEPHIN) 1 g in sodium chloride 0.9 % 100 mL IVPB (0 g Intravenous Stopped 11/02/22 1049)  azithromycin (ZITHROMAX) 500 mg in sodium chloride 0.9 % 250 mL IVPB (0 mg Intravenous Stopped 11/02/22 1049)  iohexol (OMNIPAQUE) 350 MG/ML injection 75 mL (75 mLs Intravenous Contrast Given 11/02/22 1133)  sodium chloride 0.9 % bolus 2,500 mL (0 mLs Intravenous Stopped 11/03/22 0001)    Mobility walks     Focused Assessments    R Recommendations: See Admitting Provider Note  Report given to:   Additional Notes:

## 2022-11-04 DIAGNOSIS — J398 Other specified diseases of upper respiratory tract: Secondary | ICD-10-CM | POA: Diagnosis not present

## 2022-11-04 DIAGNOSIS — M3500 Sicca syndrome, unspecified: Secondary | ICD-10-CM | POA: Diagnosis not present

## 2022-11-04 DIAGNOSIS — M549 Dorsalgia, unspecified: Secondary | ICD-10-CM

## 2022-11-04 DIAGNOSIS — G8929 Other chronic pain: Secondary | ICD-10-CM

## 2022-11-04 DIAGNOSIS — J189 Pneumonia, unspecified organism: Secondary | ICD-10-CM | POA: Diagnosis not present

## 2022-11-04 DIAGNOSIS — Z9189 Other specified personal risk factors, not elsewhere classified: Secondary | ICD-10-CM

## 2022-11-04 DIAGNOSIS — F32A Depression, unspecified: Secondary | ICD-10-CM | POA: Diagnosis not present

## 2022-11-04 LAB — CBC
HCT: 32.8 % — ABNORMAL LOW (ref 36.0–46.0)
Hemoglobin: 10.6 g/dL — ABNORMAL LOW (ref 12.0–15.0)
MCH: 29.6 pg (ref 26.0–34.0)
MCHC: 32.3 g/dL (ref 30.0–36.0)
MCV: 91.6 fL (ref 80.0–100.0)
Platelets: 199 10*3/uL (ref 150–400)
RBC: 3.58 MIL/uL — ABNORMAL LOW (ref 3.87–5.11)
RDW: 14.6 % (ref 11.5–15.5)
WBC: 8.6 10*3/uL (ref 4.0–10.5)
nRBC: 0 % (ref 0.0–0.2)

## 2022-11-04 LAB — FERRITIN: Ferritin: 82 ng/mL (ref 11–307)

## 2022-11-04 LAB — IRON AND TIBC
Iron: 20 ug/dL — ABNORMAL LOW (ref 28–170)
Saturation Ratios: 7 % — ABNORMAL LOW (ref 10.4–31.8)
TIBC: 280 ug/dL (ref 250–450)
UIBC: 260 ug/dL

## 2022-11-04 LAB — RENAL FUNCTION PANEL
Albumin: 2.7 g/dL — ABNORMAL LOW (ref 3.5–5.0)
Anion gap: 8 (ref 5–15)
BUN: 8 mg/dL (ref 6–20)
CO2: 24 mmol/L (ref 22–32)
Calcium: 8.6 mg/dL — ABNORMAL LOW (ref 8.9–10.3)
Chloride: 107 mmol/L (ref 98–111)
Creatinine, Ser: 0.62 mg/dL (ref 0.44–1.00)
GFR, Estimated: >60 mL/min (ref >60)
Glucose, Bld: 131 mg/dL — ABNORMAL HIGH (ref 70–99)
Phosphorus: 2.6 mg/dL (ref 2.5–4.6)
Potassium: 3.9 mmol/L (ref 3.5–5.1)
Sodium: 139 mmol/L (ref 135–145)

## 2022-11-04 LAB — GLUCOSE, CAPILLARY
Glucose-Capillary: 114 mg/dL — ABNORMAL HIGH (ref 70–99)
Glucose-Capillary: 113 mg/dL — ABNORMAL HIGH (ref 70–99)
Glucose-Capillary: 111 mg/dL — ABNORMAL HIGH (ref 70–99)

## 2022-11-04 LAB — PROCALCITONIN: Procalcitonin: 8.13 ng/mL

## 2022-11-04 LAB — LACTIC ACID, PLASMA
Lactic Acid, Venous: 1.1 mmol/L (ref 0.5–1.9)
Lactic Acid, Venous: 2.2 mmol/L (ref 0.5–1.9)

## 2022-11-04 LAB — VITAMIN B12: Vitamin B-12: 563 pg/mL (ref 180–914)

## 2022-11-04 LAB — RETICULOCYTES
Immature Retic Fract: 13.3 % (ref 2.3–15.9)
RBC.: 3.63 MIL/uL — ABNORMAL LOW (ref 3.87–5.11)
Retic Count, Absolute: 60.3 10*3/uL (ref 19.0–186.0)
Retic Ct Pct: 1.7 % (ref 0.4–3.1)

## 2022-11-04 LAB — FOLATE: Folate: 20.5 ng/mL (ref >5.9)

## 2022-11-04 LAB — HCG, QUANTITATIVE, PREGNANCY: hCG, Beta Chain, Quant, S: 1 m[IU]/mL (ref <5)

## 2022-11-04 LAB — MAGNESIUM: Magnesium: 1.8 mg/dL (ref 1.7–2.4)

## 2022-11-04 MED ORDER — AZITHROMYCIN 500 MG PO TABS
500.0000 mg | ORAL_TABLET | Freq: Every day | ORAL | Status: DC
  Administered 2022-11-05: 500 mg via ORAL
  Filled 2022-11-04: qty 1

## 2022-11-04 MED ORDER — SODIUM CHLORIDE 0.9 % IV SOLN
250.0000 mg | Freq: Every day | INTRAVENOUS | Status: AC
  Administered 2022-11-04 – 2022-11-05 (×2): 250 mg via INTRAVENOUS
  Filled 2022-11-04 (×3): qty 20

## 2022-11-04 NOTE — Progress Notes (Signed)
  Transition of Care (TOC) Screening Note   Patient Details  Name: [name] Date of Birth: Nov 11, 1975   Transition of Care [facility]) CM/SW Contact:    Benard Halsted, LCSW Phone Number: 11/04/2022, 8:52 AM    Transition of Care Department [facility]) has reviewed patient and no TOC needs have been identified at this time. We will continue to monitor patient advancement through interdisciplinary progression rounds. If new patient transition needs arise, please place a TOC consult.

## 2022-11-04 NOTE — TOC Transition Note (Signed)
Transition of Care [facility]) - CM/SW Discharge Note   Patient Details  Name: [name] MRN: [id number] Date of Birth: 05/29/1976  Transition of Care [facility]) CM/SW Contact:  Levonne Lapping, RN Phone Number: 11/04/2022, 11:49 AM   Clinical Narrative:     CM met with Patient bedside to discuss dc plan.  Patient will DC to home where she lives with her Mother. Patient is independent at baseline and uses no DME. PT and OT both have no recommendations . Patient has confirmed PCP as Raelyn Number PA- CM has updated on her Care Team . There are no additional TOC needs    Final next level of care: Home/Self Care Barriers to Discharge: No Barriers Identified   Patient Goals and CMS Choice   Choice offered to / list presented to : NA  Discharge Placement        Home with Mother                  Discharge Plan and Services Additional resources added to the After Visit Summary for                  DME Arranged: N/A DME Agency: NA       HH Arranged: NA [place] Agency: NA        Social Determinants of Health (SDOH) Interventions SDOH Screenings   Depression (PHQ2-9): Medium Risk (04/26/2021)  Tobacco Use: High Risk (11/02/2022)     Readmission Risk Interventions     No data to display

## 2022-11-04 NOTE — Evaluation (Signed)
Occupational Therapy Evaluation Patient Details Name: [name] MRN: [id number] DOB: June 25, 1976 Today's Date: 11/04/2022   History of Present Illness 47 y.o. female adm 1/17 with medical history significant of COPD and Sjogren's presenting with SOB.   Clinical Impression   Patient admitted for the diagnosis above.  PTA she lives with her parents and child.  She no longer drives, and needed no assist with ADL, iADL or mobility.   Patient stating MD removed O2 this morning, patient able to walk unit, perform light grooming and toileting on RA and sat's remaining above 93%.  Patient stated she is not experiencing SOB, and feels like herself.  No acute OT needs, and no post acute OT anticipated.       Recommendations for follow up therapy are one component of a multi-disciplinary discharge planning process, led by the attending physician.  Recommendations may be updated based on patient status, additional functional criteria and insurance authorization.   Follow Up Recommendations  No OT follow up     Assistance Recommended at Discharge PRN  Patient can return home with the following Assist for transportation    Functional Status Assessment  Patient has not had a recent decline in their functional status  Equipment Recommendations  None recommended by OT    Recommendations for Other Services       Precautions / Restrictions Precautions Precautions: Other (comment) Precaution Comments: watch O2 Restrictions Weight Bearing Restrictions: No      Mobility Bed Mobility Overal bed mobility: Independent                  Transfers Overall transfer level: Independent Equipment used: None                      Balance Overall balance assessment: No apparent balance deficits (not formally assessed)                                         ADL either performed or assessed with clinical judgement   ADL Overall ADL's : At baseline                                              Vision Patient Visual Report: No change from baseline       Perception     Praxis      Pertinent Vitals/Pain Pain Assessment Pain Assessment: No/denies pain     Hand Dominance Right   Extremity/Trunk Assessment Upper Extremity Assessment Upper Extremity Assessment: Overall WFL for tasks assessed   Lower Extremity Assessment Lower Extremity Assessment: Overall WFL for tasks assessed   Cervical / Trunk Assessment Cervical / Trunk Assessment: Normal   Communication Communication Communication: No difficulties   Cognition Arousal/Alertness: Awake/alert Behavior During Therapy: WFL for tasks assessed/performed Overall Cognitive Status: Within Functional Limits for tasks assessed                                                        Home Living Family/patient expects to be discharged to:: Private residence Living Arrangements: Children;Parent Available Help at Discharge: Family;Available 24 hours/day Type  of Home: Apartment Home Access: Level entry     Home Layout: One level     Bathroom Shower/Tub: Teacher, early years/pre: Standard Bathroom Accessibility: Yes How Accessible: Accessible via walker Home Equipment: None          Prior Functioning/Environment Prior Level of Function : Independent/Modified Independent                        OT Problem List: Decreased activity tolerance      OT Treatment/Interventions:      OT Goals(Current goals can be found in the care plan section) Acute Rehab OT Goals Patient Stated Goal: Hopin to go home tomorrow OT Goal Formulation: With patient Time For Goal Achievement: 11/07/22 Potential to Achieve Goals: Good  OT Frequency:      Co-evaluation              AM-PAC OT "6 Clicks" Daily Activity     Outcome Measure Help from another person eating meals?: None Help from another person taking care of personal  grooming?: None Help from another person toileting, which includes using toliet, bedpan, or urinal?: None Help from another person bathing (including washing, rinsing, drying)?: None Help from another person to put on and taking off regular upper body clothing?: None Help from another person to put on and taking off regular lower body clothing?: None 6 Click Score: 24   End of Session Nurse Communication: Mobility status  Activity Tolerance: Patient tolerated treatment well Patient left: in chair;with call bell/phone within reach  OT Visit Diagnosis: Unsteadiness on feet (R26.81)                Time: 1105-1120 OT Time Calculation (min): 15 min Charges:  OT General Charges $OT Visit: 1 Visit OT Evaluation $OT Eval Moderate Complexity: 1 Mod  11/04/2022  RP, OTR/L  Acute Rehabilitation Services  Office:  [phone]   Metta Clines 11/04/2022, 11:26 AM

## 2022-11-04 NOTE — Progress Notes (Signed)
PROGRESS NOTE  [name] FGH:[id number] DOB: Oct 10, 1976   PCP: Trey Sailors, PA  Patient is from: Home. Lives with mother and children; NOK: Mother, [name], [phone]   DOA: 11/02/2022 LOS: 2  Chief complaints Chief Complaint  Patient presents with   Shortness of Breath     Brief Narrative / Interim history: 47 year old F with PMH of COPD,Sjogren's, mood disorder, seizure disorder, obesity and former tobacco use disorder presenting with SOB, cough, hemoptysis and hypoxemia to 75 on RA when EMS arrived, and admitted for multifocal pneumonia.  CTA chest negative for PE but concerning for multifocal pneumonia, pneumatocele and tracheobronchomalacia.  She has mild leukopenia to 3.3.  BNP, troponin, COVID-19, influenza and RSV PCR negative.  Blood cultures drawn.  Started on ceftriaxone and azithromycin, and admitted.  Patient improved from respiratory standpoint.  Hemoptysis resolved.  Blood cultures NGTD.  Subjective: Seen and examined earlier this morning.  No major events overnight of this morning.  She is awake but not quite alert.  No complaints.  Denies chest pain, cough or shortness of breath.  No further hemoptysis yesterday and today.  Objective: Vitals:   11/04/22 1030 11/04/22 1035 11/04/22 1202 11/04/22 1215  BP:   132/77   Pulse: 84 84 89   Resp: 16 17 15    Temp:   98.4 F (36.9 C)   TempSrc:   Oral   SpO2: 90% 90% 90% 91%  Weight:      Height:        Examination:  GENERAL: No apparent distress.  Nontoxic. HEENT: MMM.  Vision and hearing grossly intact.  NECK: Supple.  No apparent JVD.  RESP:  No IWOB.  Fair aeration bilaterally.  Bibasilar crackles. CVS:  RRR. Heart sounds normal.  ABD/GI/GU: BS+. Abd soft, NTND.  MSK/EXT:  Moves extremities. No apparent deformity. No edema.  SKIN: no apparent skin lesion or wound NEURO: Awake but not quite alert.  Oriented x 4.  No apparent focal neuro deficit. PSYCH: Calm. Normal affect.   Procedures:   None  Microbiology summarized: [id number], influenza and RSV PCR nonreactive. Blood cultures NGTD. Sputum culture pending  Assessment and plan: Principal Problem:   Multifocal pneumonia Active Problems:   COPD (chronic obstructive pulmonary disease) (HCC)   Sjogren's syndrome (HCC)   Depression   Fibromyalgia   Hyperlipidemia   Lumbosacral radiculopathy   Sepsis due to pneumonia (HCC)   Seizure disorder (HCC)   Elevated serum hCG   Lactic acidosis   Tracheobronchomalacia   Hemoptysis   Pneumatocele of lung   Class II obesity  Multifocal pneumonia/hemoptysis: Reportedly hypoxic to 75% on RA when EMS arrived.  CTA chest suggesting multifocal pneumonia, pneumatocele and tracheobronchomalacia.  Markedly elevated procalcitonin.  Has 60-pack-year history of smoking before she quit about a year ago.  COVID-19, influenza and RSV PCR nonreactive.  Blood cultures NGTD.  No recent travel or exposure to TB.  No healthcare exposure either.  Respiratory symptoms resolving. -Continue CTX and azithromycin.  Likely discharge on oral antibiotics tomorrow -Continue LABA/LAMA/ICS. -Continue DuoNeb as needed -Incentive spirometry/flutter valve -Mucolytic's and antitussive. -Wean off oxygen/ambulatory saturation/OOB/PT/OT  Pneumatocele/tracheobronchomalacia/possible COPD: Likely from COPD.  PFT in 2016 with minimal obstructive airway disease.  Patient has 60-pack-year history of smoking. -Outpatient follow-up with pulmonology  Lactic acidosis: Likely from hypoxemia.  Resolving. -Recheck.   Hyperlipidemia -Continue atorvastatin   Mood disorder/chronic pain -Continue duloxetine, gabepentin (but not also Lyrica), mirtazepine, tizanidine   History of seizure: Reportedly out of her Lamictal recently. -Resumed  home Lamictal.  She is on 50 mg twice daily -Continue gabapentin  Iron deficiency anemia: Hgb stable after initial drop.  Iron sat 7%.  Ferritin 82 and TIBC 280. Recent Labs     01/28/22 0633 08/18/22 2235 11/02/22 0645 11/02/22 0708 11/03/22 0514 11/04/22 0510  HGB 13.3 14.3 13.6 13.9 10.4* 10.6*  -IV ferric gluconate 250 mg daily x 2 -Monitor H&H  Elevated hCG: 7.5 on admission and down to 1 two days later. -Repeat hCG 48 hours later  Chronic back pain-on Zanaflex, gabapentin and Lyrica at the same time.  At risk for polypharmacy. -Discussed risk of polypharmacy.  Encouraged patient to discuss with prescriber  Class II obesity Body mass index is 35.02 kg/m.          DVT prophylaxis:  enoxaparin (LOVENOX) injection 40 mg Start: 11/02/22 1400  Code Status: Full code Family Communication: None at bedside. Level of care: Progressive Status is: Inpatient Remains inpatient appropriate because: Multifocal pneumonia requiring IV antibiotics.   Final disposition: Likely home on 1/20. Consultants:  None  35 minutes with more than 50% spent in reviewing records, counseling patient/family and coordinating care.   Sch Meds:  Scheduled Meds:  atorvastatin  40 mg Oral Daily   [START ON 11/05/2022] azithromycin  500 mg Oral Daily   docusate sodium  100 mg Oral BID   DULoxetine  60 mg Oral Daily   enoxaparin (LOVENOX) injection  40 mg Subcutaneous Q24H   fluticasone  2 spray Each Nare Daily   gabapentin  400 mg Oral TID   lamoTRIgine  50 mg Oral BID   mirtazapine  15 mg Oral QHS   mometasone-formoterol  2 puff Inhalation BID   sodium chloride flush  3 mL Intravenous Q12H   tiZANidine  2 mg Oral Daily   umeclidinium bromide  1 puff Inhalation Daily   Continuous Infusions:  cefTRIAXone (ROCEPHIN)  IV 2 g (11/04/22 0653)   ferric gluconate (FERRLECIT) IVPB 250 mg (11/04/22 1255)   PRN Meds:.acetaminophen **OR** acetaminophen, bisacodyl, guaiFENesin, hydrALAZINE, HYDROcodone-acetaminophen, ipratropium-albuterol, ondansetron **OR** ondansetron (ZOFRAN) IV, polyethylene glycol  Antimicrobials: Anti-infectives (From admission, onward)    Start      Dose/Rate Route Frequency Ordered Stop   11/05/22 1000  azithromycin (ZITHROMAX) tablet 500 mg        500 mg Oral Daily 11/04/22 1102 11/07/22 0959   11/03/22 0700  cefTRIAXone (ROCEPHIN) 2 g in sodium chloride 0.9 % 100 mL IVPB        2 g 200 mL/hr over 30 Minutes Intravenous Every 24 hours 11/02/22 1243 11/07/22 0659   11/03/22 0700  azithromycin (ZITHROMAX) 500 mg in sodium chloride 0.9 % 250 mL IVPB  Status:  Discontinued        500 mg 250 mL/hr over 60 Minutes Intravenous Every 24 hours 11/02/22 1243 11/04/22 1102   11/02/22 0645  cefTRIAXone (ROCEPHIN) 1 g in sodium chloride 0.9 % 100 mL IVPB        1 g 200 mL/hr over 30 Minutes Intravenous  Once 11/02/22 0635 11/02/22 1049   11/02/22 0645  azithromycin (ZITHROMAX) 500 mg in sodium chloride 0.9 % 250 mL IVPB        500 mg 250 mL/hr over 60 Minutes Intravenous  Once 11/02/22 0635 11/02/22 1049        I have personally reviewed the following labs and images: CBC: Recent Labs  Lab 11/02/22 0645 11/02/22 0708 11/03/22 0514 11/04/22 0510  WBC 3.3*  --  10.3 8.6  NEUTROABS  2.5  --   --   --   HGB 13.6 13.9 10.4* 10.6*  HCT 41.8 41.0 34.3* 32.8*  MCV 91.5  --  95.3 91.6  PLT 236  --  190 199   BMP &GFR Recent Labs  Lab 11/02/22 0645 11/02/22 0708 11/03/22 0514 11/04/22 0510  NA 140 143 139 139  K 3.7 3.9 4.0 3.9  CL 106 107 110 107  CO2 23  --  23 24  GLUCOSE 120* 116* 117* 131*  BUN 14 14 14 8   CREATININE 0.84 0.60 0.88 0.62  CALCIUM 8.1*  --  7.6* 8.6*  MG  --   --   --  1.8  PHOS  --   --   --  2.6   Estimated Creatinine Clearance: 97.9 mL/min (by C-G formula based on SCr of 0.62 mg/dL). Liver & Pancreas: Recent Labs  Lab 11/02/22 0645 11/04/22 0510  AST 41  --   ALT 35  --   ALKPHOS 94  --   BILITOT 0.3  --   PROT 6.1*  --   ALBUMIN 3.2* 2.7*   Recent Labs  Lab 11/02/22 0645  LIPASE 25   No results for input(s): "AMMONIA" in the last 168 hours. Diabetic: No results for input(s): "HGBA1C" in  the last 72 hours. Recent Labs  Lab 11/04/22 1202  GLUCAP 114*   Cardiac Enzymes: No results for input(s): "CKTOTAL", "CKMB", "CKMBINDEX", "TROPONINI" in the last 168 hours. No results for input(s): "PROBNP" in the last 8760 hours. Coagulation Profile: No results for input(s): "INR", "PROTIME" in the last 168 hours. Thyroid Function Tests: No results for input(s): "TSH", "T4TOTAL", "FREET4", "T3FREE", "THYROIDAB" in the last 72 hours. Lipid Profile: No results for input(s): "CHOL", "HDL", "LDLCALC", "TRIG", "CHOLHDL", "LDLDIRECT" in the last 72 hours. Anemia Panel: Recent Labs    11/04/22 0510  VITAMINB12 563  FOLATE 20.5  FERRITIN 82  TIBC 280  IRON 20*  RETICCTPCT 1.7   Urine analysis:    Component Value Date/Time   COLORURINE YELLOW 08/08/2010 1325   APPEARANCEUR Clear 11/16/2016 1649   LABSPEC 1.010 11/16/2012 1439   PHURINE 7.0 11/16/2012 1439   GLUCOSEU Negative 11/16/2016 1649   HGBUR NEGATIVE 11/16/2012 1439   BILIRUBINUR Negative 02/18/2021 1629   BILIRUBINUR Negative 11/16/2016 1649   KETONESUR NEGATIVE 11/16/2012 1439   PROTEINUR Negative 02/18/2021 1629   PROTEINUR Negative 11/16/2016 1649   PROTEINUR NEGATIVE 11/16/2012 1439   UROBILINOGEN 1.0 02/18/2021 1629   UROBILINOGEN 0.2 11/16/2012 1439   NITRITE Negative 02/18/2021 1629   NITRITE Negative 11/16/2016 1649   NITRITE NEGATIVE 11/16/2012 1439   LEUKOCYTESUR Negative 02/18/2021 1629   LEUKOCYTESUR Negative 11/16/2016 1649   Sepsis Labs: Invalid input(s): "PROCALCITONIN", "LACTICIDVEN"  Microbiology: Recent Results (from the past 240 hour(s))  Culture, blood (routine x 2)     Status: None (Preliminary result)   Collection Time: 11/02/22  5:50 AM   Specimen: BLOOD  Result Value Ref Range Status   Specimen Description BLOOD LEFT ANTECUBITAL  Final   Special Requests   Final    BOTTLES DRAWN AEROBIC AND ANAEROBIC Blood Culture results may not be optimal due to an excessive volume of blood  received in culture bottles   Culture   Final    NO GROWTH 2 DAYS Performed at [facility], [place] [address], [place], [place] [address]    Report Status PENDING  Incomplete  Resp panel by RT-PCR (RSV, Flu A&B, Covid) Anterior Nasal Swab  Status: None   Collection Time: 11/02/22  9:55 AM   Specimen: Anterior Nasal Swab  Result Value Ref Range Status   SARS Coronavirus 2 by RT PCR NEGATIVE NEGATIVE Final    Comment: (NOTE) SARS-CoV-2 target nucleic acids are NOT DETECTED.  The SARS-CoV-2 RNA is generally detectable in upper respiratory specimens during the acute phase of infection. The lowest concentration of SARS-CoV-2 viral copies this assay can detect is 138 copies/mL. A negative result does not preclude SARS-Cov-2 infection and should not be used as the sole basis for treatment or other patient management decisions. A negative result may occur with  improper specimen collection/handling, submission of specimen other than nasopharyngeal swab, presence of viral mutation(s) within the areas targeted by this assay, and inadequate number of viral copies(<138 copies/mL). A negative result must be combined with clinical observations, patient history, and epidemiological information. The expected result is Negative.  Fact Sheet for Patients:  BloggerCourse.com  Fact Sheet for Healthcare Providers:  SeriousBroker.it  This test is no t yet approved or cleared by the [place] FDA and  has been authorized for detection and/or diagnosis of SARS-CoV-2 by FDA under an Emergency Use Authorization (EUA). This EUA will remain  in effect (meaning this test can be used) for the duration of the COVID-19 declaration under Section 564(b)(1) of the Act, 21 U.S.C.section 360bbb-3(b)(1), unless the authorization is terminated  or revoked sooner.       Influenza A by PCR NEGATIVE NEGATIVE Final   Influenza B by PCR NEGATIVE NEGATIVE  Final    Comment: (NOTE) The Xpert Xpress SARS-CoV-2/FLU/RSV plus assay is intended as an aid in the diagnosis of influenza from Nasopharyngeal swab specimens and should not be used as a sole basis for treatment. Nasal washings and aspirates are unacceptable for Xpert Xpress SARS-CoV-2/FLU/RSV testing.  Fact Sheet for Patients: BloggerCourse.com  Fact Sheet for Healthcare Providers: SeriousBroker.it  This test is not yet approved or cleared by the [place] FDA and has been authorized for detection and/or diagnosis of SARS-CoV-2 by FDA under an Emergency Use Authorization (EUA). This EUA will remain in effect (meaning this test can be used) for the duration of the COVID-19 declaration under Section 564(b)(1) of the Act, 21 U.S.C. section 360bbb-3(b)(1), unless the authorization is terminated or revoked.     Resp Syncytial Virus by PCR NEGATIVE NEGATIVE Final    Comment: (NOTE) Fact Sheet for Patients: BloggerCourse.com  Fact Sheet for Healthcare Providers: SeriousBroker.it  This test is not yet approved or cleared by the [place] FDA and has been authorized for detection and/or diagnosis of SARS-CoV-2 by FDA under an Emergency Use Authorization (EUA). This EUA will remain in effect (meaning this test can be used) for the duration of the COVID-19 declaration under Section 564(b)(1) of the Act, 21 U.S.C. section 360bbb-3(b)(1), unless the authorization is terminated or revoked.  Performed at [facility], [address] [place], [place] [address]     Radiology Studies: No results found.    Solangel Mcmanaway T. Brynnlee Cumpian [facility]  If 7PM-7AM, please contact night-coverage [email or web address] 11/04/2022, 1:20 PM

## 2022-11-05 DIAGNOSIS — J449 Chronic obstructive pulmonary disease, unspecified: Secondary | ICD-10-CM | POA: Diagnosis not present

## 2022-11-05 DIAGNOSIS — Z9189 Other specified personal risk factors, not elsewhere classified: Secondary | ICD-10-CM | POA: Diagnosis not present

## 2022-11-05 DIAGNOSIS — J189 Pneumonia, unspecified organism: Secondary | ICD-10-CM | POA: Diagnosis not present

## 2022-11-05 DIAGNOSIS — E669 Obesity, unspecified: Secondary | ICD-10-CM | POA: Diagnosis not present

## 2022-11-05 DIAGNOSIS — A419 Sepsis, unspecified organism: Secondary | ICD-10-CM

## 2022-11-05 LAB — CBC
HCT: 32.4 % — ABNORMAL LOW (ref 36.0–46.0)
Hemoglobin: 10.9 g/dL — ABNORMAL LOW (ref 12.0–15.0)
MCH: 29.4 pg (ref 26.0–34.0)
MCHC: 33.6 g/dL (ref 30.0–36.0)
MCV: 87.3 fL (ref 80.0–100.0)
Platelets: 241 10*3/uL (ref 150–400)
RBC: 3.71 MIL/uL — ABNORMAL LOW (ref 3.87–5.11)
RDW: 14.2 % (ref 11.5–15.5)
WBC: 7.8 10*3/uL (ref 4.0–10.5)
nRBC: 0 % (ref 0.0–0.2)

## 2022-11-05 LAB — LACTIC ACID, PLASMA: Lactic Acid, Venous: 0.7 mmol/L (ref 0.5–1.9)

## 2022-11-05 MED ORDER — SPIRIVA RESPIMAT 2.5 MCG/ACT IN AERS: 2.0000 | INHALATION_SPRAY | Freq: Every day | RESPIRATORY_TRACT | 1 refills | Status: AC

## 2022-11-05 MED ORDER — SYMBICORT 160-4.5 MCG/ACT IN AERO: 2.0000 | INHALATION_SPRAY | Freq: Two times a day (BID) | RESPIRATORY_TRACT | 1 refills | Status: DC

## 2022-11-05 MED ORDER — AMOXICILLIN-POT CLAVULANATE 875-125 MG PO TABS: 1.0000 | ORAL_TABLET | Freq: Two times a day (BID) | ORAL | 0 refills | Status: AC

## 2022-11-05 MED ORDER — LAMOTRIGINE 25 MG PO TABS: ORAL_TABLET | ORAL | 0 refills | Status: DC

## 2022-11-05 NOTE — Plan of Care (Signed)

## 2022-11-05 NOTE — Discharge Summary (Signed)
Physician Discharge Summary  [name] SNK:[id number] DOB: 08-07-1976 DOA: 11/02/2022  PCP: Trey Sailors, PA  Admit date: 11/02/2022 Discharge date: 11/05/2022 Admitted From: Home Disposition: Home Recommendations for Outpatient Follow-up:  Follow up with PCP in 1 to 2 weeks Recommend referral to pulmonology Check CMP and CBC in 1 week Patient is at risk for polypharmacy.  Review medications. Please follow up on the following pending results: None  Home Health: None Equipment/Devices: None  Discharge Condition: Stable CODE STATUS: Full code  Follow-up Information     Trey Sailors, PA. Schedule an appointment as soon as possible for a visit in 1 week(s).   Specialty: Physician Assistant Contact information: [place] [address] [phone]                 Hospital course 47 year old F with PMH of COPD,Sjogren's, mood disorder, seizure disorder, obesity and former tobacco use disorder presenting with SOB, cough, hemoptysis and hypoxemia to 75 on RA when EMS arrived, and admitted for multifocal pneumonia.  CTA chest negative for PE but concerning for multifocal pneumonia, pneumatocele and tracheobronchomalacia.  She has mild leukopenia to 3.3.  BNP, troponin, COVID-19, influenza and RSV PCR negative.  Blood cultures drawn.  Started on ceftriaxone and azithromycin, and admitted.   Patient improved from respiratory standpoint.  Hemoptysis resolved.  Blood cultures NGTD.  On the day of discharge, patient's respiratory symptoms and hemoptysis resolved.  Felt well and ready to go home.  She is discharged on p.o. Augmentin for 3 more days.  Recommend referral to pulmonology for PFT and further evaluation.  See individual problem list below for more.   Problems addressed during this hospitalization Principal Problem:   Multifocal pneumonia Active Problems:   COPD (chronic obstructive pulmonary disease) (HCC)   Sjogren's syndrome (HCC)    Depression   Fibromyalgia   Hyperlipidemia   Lumbosacral radiculopathy   Sepsis due to pneumonia (HCC)   Seizure disorder (HCC)   Elevated serum hCG   Lactic acidosis   Tracheobronchomalacia   Hemoptysis   Pneumatocele of lung   Class II obesity   At risk for polypharmacy   Multifocal pneumonia/hemoptysis: Reportedly hypoxic to 75% on RA when EMS arrived.  CTA chest suggesting multifocal pneumonia, pneumatocele and tracheobronchomalacia.  Markedly elevated procalcitonin.  Has 60-pack-year history of smoking before she quit about a year ago.  COVID-19, influenza and RSV PCR nonreactive.  Blood cultures NGTD.  No recent travel or exposure to TB.  No healthcare exposure either.  Respiratory symptoms resolved. -On CTX for 4 days.  Discharged on p.o. Augmentin for 3 more days -Received Zithromax 500 mg daily for 3 days -Started Symbicort on discharge.  Continue home Spiriva. -Recommend referral to pulmonology for PFT and further evaluation.   Pneumatocele/tracheobronchomalacia/possible COPD: Likely from COPD.  PFT in 2016 with minimal obstructive airway disease.  Patient has 60-pack-year history of smoking. -Outpatient follow-up with pulmonology   Lactic acidosis: Likely from hypoxemia.  Resolved.  Hyperlipidemia -Continue atorvastatin   Mood disorder/chronic pain -Continue duloxetine, gabepentin (but not also Lyrica), mirtazepine, tizanidine -Patient is at risk for polypharmacy.  Reviewed meds at follow-up.   History of seizure: Reportedly out of her Lamictal recently. -Refilled her Lamictal on discharge. -Continue gabapentin   Iron deficiency anemia: Hgb stable after initial drop.  Iron sat 7%.  Ferritin 82 and TIBC 280. Recent Labs    01/28/22 2409 08/18/22 2235 11/02/22 0645 11/02/22 0708 11/03/22 0514 11/04/22 0510 11/05/22 0412  HGB  13.3 14.3 13.6 13.9 10.4* 10.6* 10.9*  -IV ferric gluconate 250 mg daily x 2 -Monitor H&H   Elevated hCG: 7.5 on admission and down to  1 two days later.  Class II obesity Body mass index is 35.02 kg/m.            Vital signs Vitals:   11/04/22 2300 11/05/22 0432 11/05/22 0735 11/05/22 0754  BP: 136/77 (!) 157/69 (!) 154/80   Pulse: 89 86 88   Temp: 98.5 F (36.9 C) 97.8 F (36.6 C) 99.2 F (37.3 C)   Resp: 14 19 19    Height:      Weight:      SpO2: (!) 88% 91% (!) 89% 93%  TempSrc: Oral Oral Oral   BMI (Calculated):         Discharge exam  GENERAL: No apparent distress.  Nontoxic. HEENT: MMM.  Vision and hearing grossly intact.  NECK: Supple.  No apparent JVD.  RESP:  No IWOB.  Fair aeration bilaterally. CVS:  RRR. Heart sounds normal.  ABD/GI/GU: BS+. Abd soft, NTND.  MSK/EXT:  Moves extremities. No apparent deformity. No edema.  SKIN: no apparent skin lesion or wound NEURO: Awake and alert. Oriented appropriately.  No apparent focal neuro deficit. PSYCH: Calm. Normal affect.   Discharge Instructions Discharge Instructions     Call MD for:  difficulty breathing, headache or visual disturbances   Complete by: As directed    Call MD for:  extreme fatigue   Complete by: As directed    Call MD for:  persistant dizziness or light-headedness   Complete by: As directed    Diet general   Complete by: As directed    Discharge instructions   Complete by: As directed    It has been a pleasure taking care of you!  You were hospitalized due to pneumonia for which you have been treated with antibiotics.  Your symptoms improved.  We are discharging you more antibiotics to complete treatment course.  Please review your new medication list and the directions on your medications before you take them.  Follow-up with your primary care doctor in 1 to 2 weeks or sooner if needed.  Note that the combination of Remeron, Lyrica, Zanaflex and gabapentin could increase your risk of sedation and confusion.  Also combination of diclofenac and meloxicam can cause stomach ulcer and kidney injury. Please discuss with  your primary care doctor if adjustments need to be made.       Take care,   Increase activity slowly   Complete by: As directed       Allergies as of 11/05/2022   No Active Allergies      Medication List     TAKE these medications    albuterol 108 (90 Base) MCG/ACT inhaler Commonly known as: ProAir HFA INHALE 1 TO 2 PUFFS INTO LUNGS EVERY 6 HOURS AS NEEDED FOR WHEEZING OR SHORTNESS OF BREATH What changed:  how much to take how to take this when to take this reasons to take this additional instructions   amoxicillin-clavulanate 875-125 MG tablet Commonly known as: AUGMENTIN Take 1 tablet by mouth 2 (two) times daily for 3 days. Start taking on: November 06, 2022   atorvastatin 40 MG tablet Commonly known as: LIPITOR TAKE 1 TABLET(40 MG) BY MOUTH DAILY What changed: See the new instructions.   diclofenac 75 MG EC tablet Commonly known as: VOLTAREN Take 75 mg by mouth 2 (two) times daily as needed for mild pain.   DULoxetine  60 MG capsule Commonly known as: CYMBALTA TAKE 1 CAPSULE(60 MG) BY MOUTH DAILY What changed: See the new instructions.   fluticasone 50 MCG/ACT nasal spray Commonly known as: FLONASE instill 2 sprays into each nostril once daily   gabapentin 400 MG capsule Commonly known as: NEURONTIN Take 400 mg by mouth 3 (three) times daily.   lamoTRIgine 25 MG tablet Commonly known as: LAMICTAL TAKE 2 TABLETS BY MOUTH TWICE DAILY What changed:  how much to take how to take this when to take this additional instructions   meloxicam 7.5 MG tablet Commonly known as: MOBIC TAKE 1 TABLET(7.5 MG) BY MOUTH DAILY What changed: See the new instructions.   mirtazapine 15 MG tablet Commonly known as: REMERON TAKE 1 TABLET(15 MG) BY MOUTH AT BEDTIME What changed: See the new instructions.   multivitamin with minerals tablet Take 1 tablet by mouth daily.   pregabalin 75 MG capsule Commonly known as: LYRICA Take 75 mg by mouth 3 (three) times  daily.   Spiriva Respimat 2.5 MCG/ACT Aers Generic drug: Tiotropium Bromide Monohydrate Inhale 2 puffs into the lungs daily.   Symbicort 160-4.5 MCG/ACT inhaler Generic drug: budesonide-formoterol Inhale 2 puffs into the lungs in the morning and at bedtime.   tiZANidine 2 MG tablet Commonly known as: ZANAFLEX Take 2 mg by mouth daily.        Consultations: None  Procedures/Studies:   CT Abdomen Pelvis W Contrast  Result Date: 11/02/2022 CLINICAL DATA:  Abdominal pain, shortness of breath EXAM: CT ABDOMEN AND PELVIS WITH CONTRAST TECHNIQUE: Multidetector CT imaging of the abdomen and pelvis was performed using the standard protocol following bolus administration of intravenous contrast. RADIATION DOSE REDUCTION: This exam was performed according to the departmental dose-optimization program which includes automated exposure control, adjustment of the mA and/or kV according to patient size and/or use of iterative reconstruction technique. CONTRAST:  39mL OMNIPAQUE IOHEXOL 350 MG/ML SOLN COMPARISON:  None Available. FINDINGS: Lower chest: Please see separately reported examination of the abdomen and pelvis. Hepatobiliary: No solid liver abnormality is seen. No gallstones, gallbladder wall thickening, or biliary dilatation. Pancreas: Unremarkable. No pancreatic ductal dilatation or surrounding inflammatory changes. Spleen: Normal in size without significant abnormality. Adrenals/Urinary Tract: Adrenal glands are unremarkable. Kidneys are normal, without renal calculi, solid lesion, or hydronephrosis. Bladder is unremarkable. Stomach/Bowel: Stomach is within normal limits. Appendix appears normal. No evidence of bowel wall thickening, distention, or inflammatory changes. Moderate burden of stool throughout the colon. Vascular/Lymphatic: No significant vascular findings are present. No enlarged abdominal or pelvic lymph nodes. Reproductive: No mass or other significant abnormality. Other: No  abdominal wall hernia or abnormality. No ascites. Musculoskeletal: No acute or significant osseous findings. IMPRESSION: 1. No acute CT findings of the abdomen or pelvis to explain abdominal pain. 2. Moderate burden of stool throughout the colon. Electronically Signed   By: Jearld Lesch M.D.   On: 11/02/2022 11:47   CT Angio Chest PE W/Cm &/Or Wo Cm  Result Date: 11/02/2022 CLINICAL DATA:  Shortness of breath, PE suspected EXAM: CT ANGIOGRAPHY CHEST WITH CONTRAST TECHNIQUE: Multidetector CT imaging of the chest was performed using the standard protocol during bolus administration of intravenous contrast. Multiplanar CT image reconstructions and MIPs were obtained to evaluate the vascular anatomy. RADIATION DOSE REDUCTION: This exam was performed according to the departmental dose-optimization program which includes automated exposure control, adjustment of the mA and/or kV according to patient size and/or use of iterative reconstruction technique. CONTRAST:  55mL OMNIPAQUE IOHEXOL 350 MG/ML SOLN COMPARISON:  None Available. FINDINGS: Cardiovascular: Satisfactory opacification of the pulmonary arteries to the segmental level. No evidence of pulmonary embolism. Normal heart size. No pericardial effusion. Mediastinum/Nodes: Numerous prominent mediastinal and bilateral hilar lymph nodes. Thyroid gland, trachea, and esophagus demonstrate no significant findings. Lungs/Pleura: Diffuse bilateral ground-glass attenuation of the airspaces, with extensive heterogeneous and consolidative airspace opacity throughout the dependent lungs. Numerous thin walled centrilobular and paraseptal cysts of varying sizes, which are seen throughout the lungs although concentrated in the apices. Tracheobronchomalacia (series 9, image 43). No pleural effusion or pneumothorax. Upper Abdomen: Please see separately reported examination of the abdomen and pelvis. Musculoskeletal: No chest wall abnormality. No acute osseous findings.  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 Review of the MIP images confirms the above findings. IMPRESSION: 1. Negative examination for pulmonary embolism. 2. Diffuse bilateral ground-glass attenuation of the airspaces, with extensive heterogeneous and consolidative airspace opacity throughout the dependent lungs. Findings are consistent with extensive multifocal infection and/or aspiration. 3. Numerous thin walled centrilobular and paraseptal cysts of varying sizes, which are seen throughout the lungs although concentrated in the apices. This most likely reflects a combination of emphysema and postinfectious or inflammatory pneumatoceles, although in general cystic lung disease such as desquamative interstitial pneumonitis, lymphangioleiomyomatosis or lymphocytic interstitial pneumonitis are differential considerations. 4. Tracheobronchomalacia. 5. Numerous prominent mediastinal and bilateral hilar lymph nodes, likely reactive. Electronically Signed   By: Delanna Ahmadi M.D.   On: 11/02/2022 11:40   DG Chest Port 1 View  Result Date: 11/02/2022 CLINICAL DATA:  Shortness of breath and hemoptysis. EXAM: PORTABLE CHEST 1 VIEW COMPARISON:  05/11/2021 FINDINGS: 0546 hours. The cardio pericardial silhouette is enlarged. There is pulmonary vascular congestion without overt pulmonary edema. Patchy airspace disease noted both lung bases. The visualized bony structures of the thorax are unremarkable. Telemetry leads overlie the chest. IMPRESSION: 1. Enlargement of the cardiopericardial silhouette with pulmonary vascular congestion. 2. Patchy bibasilar airspace disease pneumonia compatible with multifocal pneumonia. Electronically Signed   By: Misty Stanley M.D.   On: 11/02/2022 06:09       The results of significant diagnostics from this hospitalization (including imaging, microbiology, ancillary and laboratory) are listed below for reference.     Microbiology: Recent Results  (from the past 240 hour(s))  Culture, blood (routine x 2)     Status: None (Preliminary result)   Collection Time: 11/02/22  5:50 AM   Specimen: BLOOD  Result Value Ref Range Status   Specimen Description BLOOD LEFT ANTECUBITAL  Final   Special Requests   Final    BOTTLES DRAWN AEROBIC AND ANAEROBIC Blood Culture results may not be optimal due to an excessive volume of blood received in culture bottles   Culture   Final    NO GROWTH 3 DAYS Performed at [facility], [place] [address] [place], [place] [address]    Report Status PENDING  Incomplete  Resp panel by RT-PCR (RSV, Flu A&B, Covid) Anterior Nasal Swab     Status: None   Collection Time: 11/02/22  9:55 AM   Specimen: Anterior Nasal Swab  Result Value Ref Range Status   SARS Coronavirus 2 by RT PCR NEGATIVE NEGATIVE Final    Comment: (NOTE) SARS-CoV-2 target nucleic acids are NOT DETECTED.  The SARS-CoV-2 RNA is generally detectable in upper respiratory specimens during the acute phase of infection. The lowest concentration of SARS-CoV-2 viral copies this assay can detect is 138 copies/mL. A negative result does not preclude SARS-Cov-2 infection and  should not be used as the sole basis for treatment or other patient management decisions. A negative result may occur with  improper specimen collection/handling, submission of specimen other than nasopharyngeal swab, presence of viral mutation(s) within the areas targeted by this assay, and inadequate number of viral copies(<138 copies/mL). A negative result must be combined with clinical observations, patient history, and epidemiological information. The expected result is Negative.  Fact Sheet for Patients:  EntrepreneurPulse.com.au  Fact Sheet for Healthcare Providers:  IncredibleEmployment.be  This test is no t yet approved or cleared by the [place] FDA and  has been authorized for detection and/or diagnosis of SARS-CoV-2  by FDA under an Emergency Use Authorization (EUA). This EUA will remain  in effect (meaning this test can be used) for the duration of the COVID-19 declaration under Section 564(b)(1) of the Act, 21 U.S.C.section 360bbb-3(b)(1), unless the authorization is terminated  or revoked sooner.       Influenza A by PCR NEGATIVE NEGATIVE Final   Influenza B by PCR NEGATIVE NEGATIVE Final    Comment: (NOTE) The Xpert Xpress SARS-CoV-2/FLU/RSV plus assay is intended as an aid in the diagnosis of influenza from Nasopharyngeal swab specimens and should not be used as a sole basis for treatment. Nasal washings and aspirates are unacceptable for Xpert Xpress SARS-CoV-2/FLU/RSV testing.  Fact Sheet for Patients: EntrepreneurPulse.com.au  Fact Sheet for Healthcare Providers: IncredibleEmployment.be  This test is not yet approved or cleared by the [place] FDA and has been authorized for detection and/or diagnosis of SARS-CoV-2 by FDA under an Emergency Use Authorization (EUA). This EUA will remain in effect (meaning this test can be used) for the duration of the COVID-19 declaration under Section 564(b)(1) of the Act, 21 U.S.C. section 360bbb-3(b)(1), unless the authorization is terminated or revoked.     Resp Syncytial Virus by PCR NEGATIVE NEGATIVE Final    Comment: (NOTE) Fact Sheet for Patients: EntrepreneurPulse.com.au  Fact Sheet for Healthcare Providers: IncredibleEmployment.be  This test is not yet approved or cleared by the [place] FDA and has been authorized for detection and/or diagnosis of SARS-CoV-2 by FDA under an Emergency Use Authorization (EUA). This EUA will remain in effect (meaning this test can be used) for the duration of the COVID-19 declaration under Section 564(b)(1) of the Act, 21 U.S.C. section 360bbb-3(b)(1), unless the authorization is terminated or revoked.  Performed at  [facility], [place] [address] [place], [place] [address]      Labs:  CBC: Recent Labs  Lab 11/02/22 0645 11/02/22 0708 11/03/22 0514 11/04/22 0510 11/05/22 0412  WBC 3.3*  --  10.3 8.6 7.8  NEUTROABS 2.5  --   --   --   --   HGB 13.6 13.9 10.4* 10.6* 10.9*  HCT 41.8 41.0 34.3* 32.8* 32.4*  MCV 91.5  --  95.3 91.6 87.3  PLT 236  --  190 199 241   BMP &GFR Recent Labs  Lab 11/02/22 0645 11/02/22 0708 11/03/22 0514 11/04/22 0510  NA 140 143 139 139  K 3.7 3.9 4.0 3.9  CL 106 107 110 107  CO2 23  --  23 24  GLUCOSE 120* 116* 117* 131*  BUN 14 14 14 8   CREATININE 0.84 0.60 0.88 0.62  CALCIUM 8.1*  --  7.6* 8.6*  MG  --   --   --  1.8  PHOS  --   --   --  2.6   Estimated Creatinine Clearance: 97.9 mL/min (by C-G formula based  on SCr of 0.62 mg/dL). Liver & Pancreas: Recent Labs  Lab 11/02/22 0645 11/04/22 0510  AST 41  --   ALT 35  --   ALKPHOS 94  --   BILITOT 0.3  --   PROT 6.1*  --   ALBUMIN 3.2* 2.7*   Recent Labs  Lab 11/02/22 0645  LIPASE 25   No results for input(s): "AMMONIA" in the last 168 hours. Diabetic: No results for input(s): "HGBA1C" in the last 72 hours. Recent Labs  Lab 11/04/22 1202 11/04/22 1809 11/04/22 2144  GLUCAP 114* 113* 111*   Cardiac Enzymes: No results for input(s): "CKTOTAL", "CKMB", "CKMBINDEX", "TROPONINI" in the last 168 hours. No results for input(s): "PROBNP" in the last 8760 hours. Coagulation Profile: No results for input(s): "INR", "PROTIME" in the last 168 hours. Thyroid Function Tests: No results for input(s): "TSH", "T4TOTAL", "FREET4", "T3FREE", "THYROIDAB" in the last 72 hours. Lipid Profile: No results for input(s): "CHOL", "HDL", "LDLCALC", "TRIG", "CHOLHDL", "LDLDIRECT" in the last 72 hours. Anemia Panel: Recent Labs    11/04/22 0510  VITAMINB12 563  FOLATE 20.5  FERRITIN 82  TIBC 280  IRON 20*  RETICCTPCT 1.7   Urine analysis:    Component Value Date/Time   COLORURINE YELLOW  08/08/2010 1325   APPEARANCEUR Clear 11/16/2016 1649   LABSPEC 1.010 11/16/2012 1439   PHURINE 7.0 11/16/2012 1439   GLUCOSEU Negative 11/16/2016 1649   HGBUR NEGATIVE 11/16/2012 1439   BILIRUBINUR Negative 02/18/2021 1629   BILIRUBINUR Negative 11/16/2016 1649   KETONESUR NEGATIVE 11/16/2012 1439   PROTEINUR Negative 02/18/2021 1629   PROTEINUR Negative 11/16/2016 1649   PROTEINUR NEGATIVE 11/16/2012 1439   UROBILINOGEN 1.0 02/18/2021 1629   UROBILINOGEN 0.2 11/16/2012 1439   NITRITE Negative 02/18/2021 1629   NITRITE Negative 11/16/2016 1649   NITRITE NEGATIVE 11/16/2012 1439   LEUKOCYTESUR Negative 02/18/2021 1629   LEUKOCYTESUR Negative 11/16/2016 1649   Sepsis Labs: Invalid input(s): "PROCALCITONIN", "LACTICIDVEN"   SIGNED:  Mercy Riding, MD  [facility] 11/05/2022, 1:13 PM

## 2022-11-05 NOTE — Progress Notes (Signed)
Rounded on patient at [place] and patient is currently on 2L [place].

## 2022-11-05 NOTE — Progress Notes (Signed)
Rounded to do vital signs at 7:40 and patient was sitting on side of bed on Room Air.  I did ask patient about being on oxygen overnight.  Patient stated someone came in and put her on it because her numbers were low.  Patient was satting between 88-89 sitting on side of bed without oxygen.

## 2022-11-07 LAB — CULTURE, BLOOD (ROUTINE X 2): Culture: NO GROWTH

## 2022-12-21 ENCOUNTER — Encounter: Payer: Medicaid Other | Admitting: Primary Care

## 2022-12-21 NOTE — Patient Instructions (Addendum)
  Sleep apnea is defined as period of 10 seconds or longer when you stop breathing at night. This can happen multiple times a night. Dx sleep apnea is when this occurs more than 5 times an hour.    Mild OSA 5-15 apneic events an hour Moderate OSA 15-30 apneic events an hour Severe OSA > 30 apneic events an hour   Untreated sleep apnea puts you at higher risk for cardiac arrhythmias, pulmonary HTN, stroke and diabetes  Treatment options include weight loss, side sleeping position, oral appliance, CPAP therapy or referral to ENT for possible surgical options    Recommendations: Focus on side sleeping position or elevate head with wedge pillow 30 degrees Work on weight loss efforts if able  Do not drive if experiencing excessive daytime sleepiness of fatigue    Orders: Home sleep study re: loud snoring  PFTs    Follow-up: Please call to schedule follow-up 1-2 weeks after completing home sleep study to review results and treatment if needed (can be virtual)

## 2022-12-21 NOTE — Progress Notes (Signed)
 This encounter was created in error - please disregard.

## 2022-12-26 ENCOUNTER — Emergency Department (HOSPITAL_COMMUNITY)
Admission: EM | Admit: 2022-12-26 | Discharge: 2022-12-26 | Disposition: A | Discharge status: Home / Self Care | Payer: Medicaid Other | Attending: Emergency Medicine | Admitting: Emergency Medicine

## 2022-12-26 ENCOUNTER — Emergency Department (HOSPITAL_COMMUNITY): Payer: Medicaid Other

## 2022-12-26 ENCOUNTER — Encounter (HOSPITAL_COMMUNITY): Payer: Self-pay | Admitting: Emergency Medicine

## 2022-12-26 ENCOUNTER — Other Ambulatory Visit: Payer: Self-pay

## 2022-12-26 DIAGNOSIS — Y92512 Supermarket, store or market as the place of occurrence of the external cause: Secondary | ICD-10-CM | POA: Insufficient documentation

## 2022-12-26 DIAGNOSIS — Y9301 Activity, walking, marching and hiking: Secondary | ICD-10-CM | POA: Diagnosis not present

## 2022-12-26 DIAGNOSIS — M79604 Pain in right leg: Secondary | ICD-10-CM | POA: Insufficient documentation

## 2022-12-26 DIAGNOSIS — W19XXXA Unspecified fall, initial encounter: Secondary | ICD-10-CM | POA: Diagnosis not present

## 2022-12-26 DIAGNOSIS — M25551 Pain in right hip: Secondary | ICD-10-CM | POA: Diagnosis not present

## 2022-12-26 DIAGNOSIS — M25552 Pain in left hip: Secondary | ICD-10-CM | POA: Diagnosis not present

## 2022-12-26 DIAGNOSIS — R55 Syncope and collapse: Secondary | ICD-10-CM | POA: Insufficient documentation

## 2022-12-26 DIAGNOSIS — M79605 Pain in left leg: Secondary | ICD-10-CM | POA: Insufficient documentation

## 2022-12-26 LAB — BASIC METABOLIC PANEL
Anion gap: 8 (ref 5–15)
BUN: 13 mg/dL (ref 6–20)
CO2: 28 mmol/L (ref 22–32)
Calcium: 9 mg/dL (ref 8.9–10.3)
Chloride: 102 mmol/L (ref 98–111)
Creatinine, Ser: 0.63 mg/dL (ref 0.44–1.00)
GFR, Estimated: >60 mL/min (ref >60)
Glucose, Bld: 116 mg/dL — ABNORMAL HIGH (ref 70–99)
Potassium: 3.9 mmol/L (ref 3.5–5.1)
Sodium: 138 mmol/L (ref 135–145)

## 2022-12-26 LAB — CBC
HCT: 38.4 % (ref 36.0–46.0)
Hemoglobin: 12 g/dL (ref 12.0–15.0)
MCH: 28.8 pg (ref 26.0–34.0)
MCHC: 31.3 g/dL (ref 30.0–36.0)
MCV: 92.3 fL (ref 80.0–100.0)
Platelets: 233 10*3/uL (ref 150–400)
RBC: 4.16 MIL/uL (ref 3.87–5.11)
RDW: 14.6 % (ref 11.5–15.5)
WBC: 4.8 10*3/uL (ref 4.0–10.5)
nRBC: 0 % (ref 0.0–0.2)

## 2022-12-26 LAB — I-STAT BETA HCG BLOOD, ED (MC, WL, AP ONLY): I-stat hCG, quantitative: 8.6 m[IU]/mL — ABNORMAL HIGH (ref <5)

## 2022-12-26 NOTE — Discharge Instructions (Signed)
Your x-rays and CT scan did not show any concerns.  For any concerning symptoms return to the emergency department.  Otherwise I recommend you follow-up with your primary care doctor.

## 2022-12-26 NOTE — ED Provider Triage Note (Signed)
Emergency Medicine Provider Triage Evaluation Note  [name] , a 47 y.o. female  was evaluated in triage.  Pt complains of pain in the front of both legs. Pt reports pain in both hips.  Pt able to walk,    Review of Systems  Positive:  Negative:   Physical Exam  BP 139/60   Pulse 88   Temp 98.5 F (36.9 C)   Resp 18   Ht '5\' 4"'$  (1.626 m)   LMP 12/01/2022   SpO2 95%   BMI 35.02 kg/m  Gen:   Awake, no distress   Resp:  Normal effort  MSK:   Moves extremities without difficulty  Other:  Tender bilat anterior shins   Medical Decision Making  Medically screening exam initiated at 11:20 AM.  Appropriate orders placed.  Stasha Braz Sh[place] was informed that the remainder of the evaluation will be completed by another provider, this initial triage assessment does not replace that evaluation, and the importance of remaining in the ED until their evaluation is complete.     Fransico Meadow, [place] 12/26/22 1122

## 2022-12-26 NOTE — ED Notes (Signed)
Came to discharge pt, pt had already left the department per staff

## 2022-12-26 NOTE — ED Provider Notes (Signed)
[facility] Provider Note   CSN: [id number] Arrival date & time: 12/26/22  1045     History  Chief Complaint  Patient presents with   Loss of Consciousness   Fall    [name] is a 47 y.o. female.  47 year old female presents today for evaluation of fall that occurred several days ago.  She states she reached out to her PCP and was recommended to come into the ED for evaluation of her hip pain.  She states she was walking in [place] when she saw floaters and remembers falling backwards.  She is unsure if she hit her head.  She states this was witnessed by her husband as well as another bystander.  No seizure-like activity was reported according the patient.  She states since then she has not had another episode of lightheadedness.  No prodromal symptoms such as shortness of breath, chest pain.  Has been drinking plenty of fluids.  Complaining of bilateral leg pain, worse in the hips.  no back pain or neck pain.  The history is provided by the patient. No language interpreter was used.       Home Medications Prior to Admission medications   Medication Sig Start Date End Date Taking? Authorizing Provider  albuterol (PROAIR HFA) 108 (90 Base) MCG/ACT inhaler INHALE 1 TO 2 PUFFS INTO LUNGS EVERY 6 HOURS AS NEEDED FOR WHEEZING OR SHORTNESS OF BREATH Patient taking differently: Inhale 1 puff into the lungs every 6 (six) hours as needed for wheezing or shortness of breath. 03/01/21   Lyndal Pulley, MD  atorvastatin (LIPITOR) 40 MG tablet TAKE 1 TABLET(40 MG) BY MOUTH DAILY Patient taking differently: Take 40 mg by mouth daily. 04/23/21   Sanjuan Dame, MD  budesonide-formoterol [facility]) 160-4.5 MCG/ACT inhaler Inhale 2 puffs into the lungs in the morning and at bedtime. 11/05/22   Mercy Riding, MD  diclofenac (VOLTAREN) 75 MG EC tablet Take 75 mg by mouth 2 (two) times daily as needed for mild pain. 10/26/22   [provider]   DULoxetine (CYMBALTA) 60 MG capsule TAKE 1 CAPSULE(60 MG) BY MOUTH DAILY Patient taking differently: Take 60 mg by mouth daily. 03/16/21   Andrew Au, MD  fluticasone [facility]) 50 MCG/ACT nasal spray instill 2 sprays into each nostril once daily 12/23/20   Seawell, Jaimie A, DO  gabapentin (NEURONTIN) 400 MG capsule Take 400 mg by mouth 3 (three) times daily. 01/13/22   [provider]  lamoTRIgine (LAMICTAL) 25 MG tablet TAKE 2 TABLETS BY MOUTH TWICE DAILY 11/05/22   Mercy Riding, MD  meloxicam (MOBIC) 7.5 MG tablet TAKE 1 TABLET(7.5 MG) BY MOUTH DAILY Patient taking differently: Take 7.5 mg by mouth daily. 09/22/21   Rehman, Areeg N, DO  mirtazapine (REMERON) 15 MG tablet TAKE 1 TABLET(15 MG) BY MOUTH AT BEDTIME Patient taking differently: Take 15 mg by mouth at bedtime. 07/28/21   Rehman, Areeg N, DO  Multiple Vitamins-Minerals (MULTIVITAMIN WITH MINERALS) tablet Take 1 tablet by mouth daily.    [provider]  pregabalin (LYRICA) 75 MG capsule Take 75 mg by mouth 3 (three) times daily. 09/14/22   [provider]  Tiotropium Bromide Monohydrate (SPIRIVA RESPIMAT) 2.5 MCG/ACT AERS Inhale 2 puffs into the lungs daily. 11/05/22   Mercy Riding, MD  tiZANidine (ZANAFLEX) 2 MG tablet Take 2 mg by mouth daily. 01/25/22   [provider]      Allergies    Patient  has no known allergies.    Review of Systems   Review of Systems  Constitutional:  Negative for chills and fever.  Eyes:  Negative for visual disturbance.  Respiratory:  Negative for shortness of breath.   Gastrointestinal:  Negative for abdominal pain.  Musculoskeletal:  Positive for arthralgias.  Neurological:  Positive for syncope. Negative for headaches.  All other systems reviewed and are negative.   Physical Exam Updated Vital Signs BP 139/60   Pulse 88   Temp 98.5 F (36.9 C)   Resp 18   Ht '5\' 4"'$  (1.626 m)   LMP 12/01/2022   SpO2 95%   BMI 35.02 kg/m  Physical Exam Vitals and  nursing note reviewed.  Constitutional:      General: She is not in acute distress.    Appearance: Normal appearance. She is not ill-appearing.  HENT:     Head: Normocephalic and atraumatic.     Nose: Nose normal.  Eyes:     General: No scleral icterus.    Extraocular Movements: Extraocular movements intact.     Conjunctiva/sclera: Conjunctivae normal.  Cardiovascular:     Rate and Rhythm: Normal rate and regular rhythm.     Pulses: Normal pulses.     Heart sounds: Normal heart sounds.  Pulmonary:     Effort: Pulmonary effort is normal. No respiratory distress.     Breath sounds: Normal breath sounds. No wheezing or rales.  Abdominal:     General: There is no distension.     Palpations: Abdomen is soft.     Tenderness: There is no abdominal tenderness. There is no guarding.  Musculoskeletal:        General: Normal range of motion.     Cervical back: Normal range of motion.     Right lower leg: No edema.     Left lower leg: No edema.     Comments: Cervical, thoracic, lumbar spine without tenderness to palpation.  Full range of motion bilateral upper and lower extremity with 5/5 strength in the extensor and flexor muscle groups.  Skin:    General: Skin is warm and dry.  Neurological:     General: No focal deficit present.     Mental Status: She is alert. Mental status is at baseline.     ED Results / Procedures / Treatments   Labs (all labs ordered are listed, but only abnormal results are displayed) Labs Reviewed  BASIC METABOLIC PANEL - Abnormal; Notable for the following components:      Result Value   Glucose, Bld 116 (*)    All other components within normal limits  I-STAT BETA HCG BLOOD, ED ([facility], [facility], [facility] ONLY) - Abnormal; Notable for the following components:   I-stat hCG, quantitative 8.6 (*)    All other components within normal limits  CBC  URINALYSIS, ROUTINE W REFLEX MICROSCOPIC  CBG MONITORING, ED    EKG None  Radiology CT Head Wo Contrast  Result  Date: 12/26/2022 CLINICAL DATA:  Loss of consciousness with fall to the ground. EXAM: CT HEAD WITHOUT CONTRAST TECHNIQUE: Contiguous axial images were obtained from the base of the skull through the vertex without intravenous contrast. RADIATION DOSE REDUCTION: This exam was performed according to the departmental dose-optimization program which includes automated exposure control, adjustment of the mA and/or kV according to patient size and/or use of iterative reconstruction technique. COMPARISON:  MRI 02/17/2022 FINDINGS: Brain: The brain shows a normal appearance without evidence of malformation, atrophy, old or acute small or large  vessel infarction, mass lesion, hemorrhage, hydrocephalus or extra-axial collection. Vascular: No hyperdense vessel. No evidence of atherosclerotic calcification. Skull: Normal.  No traumatic finding.  No focal bone lesion. Sinuses/Orbits: Sinuses are clear. Orbits appear normal. Mastoids are clear. Other: None significant IMPRESSION: Normal head CT. Electronically Signed   By: Nelson Chimes M.D.   On: 12/26/2022 14:20   DG Tibia/Fibula Right  Result Date: 12/26/2022 CLINICAL DATA:  Fall leg pain EXAM: RIGHT TIBIA AND FIBULA - 2 VIEW COMPARISON:  12/26/2022 FINDINGS: There is no evidence of fracture or other focal bone lesions. Soft tissues are unremarkable. IMPRESSION: Negative. Electronically Signed   By: Jerilynn Mages.  Shick M.D.   On: 12/26/2022 12:06   DG Tibia/Fibula Left  Result Date: 12/26/2022 CLINICAL DATA:  Fall, lower leg pain EXAM: LEFT TIBIA AND FIBULA - 2 VIEW COMPARISON:  12/26/2022 FINDINGS: There is no evidence of fracture or other focal bone lesions. Soft tissues are unremarkable. IMPRESSION: No acute finding Electronically Signed   By: Jerilynn Mages.  Shick M.D.   On: 12/26/2022 12:05   DG Pelvis 1-2 Views  Result Date: 12/26/2022 CLINICAL DATA:  Recent fall hip and lower leg pain EXAM: PELVIS - 1-2 VIEW COMPARISON:  12/26/2022 FINDINGS: There is no evidence of pelvic fracture  or diastasis. No pelvic bone lesions are seen. Bony pelvis and hips appear symmetric and intact. Nonobstructive bowel gas pattern. IMPRESSION: No acute finding Electronically Signed   By: Jerilynn Mages.  Shick M.D.   On: 12/26/2022 12:04    Procedures Procedures    Medications Ordered in ED Medications - No data to display  ED Course/ Medical Decision Making/ A&P                             Medical Decision Making Amount and/or Complexity of Data Reviewed Labs: ordered. Radiology: ordered.   47 year old female with past medical history of seizure presents today following a syncopal episode that occurred several days ago.  No seizure-like activity according to witnesses.  She is compliant with her home medications.  Plain film radiographs obtained without acute bony abnormality.  She is able ambulate without difficulty.  Will obtain CT head given we are unsure if she had head injury.  CT head without acute intracranial injury.  Symptomatic management discussed with patient.  She is agreeable to this.  She is appropriate for discharge.  Discharged in stable condition.  Strict return precautions discussed.  Given she had floaters and no other symptoms.  Suspect this may be due to dehydration or orthostasis.   Final Clinical Impression(s) / ED Diagnoses Final diagnoses:  Fall, initial encounter    Rx / DC Orders ED Discharge Orders     None         Evlyn Courier, PA-C 12/26/22 1457    Godfrey Pick, MD 12/27/22 [phone]

## 2022-12-26 NOTE — ED Triage Notes (Signed)
Pt reports she was walking in [place], started to see spots, passed out. Pt reports pain in both hips and right leg. Pt ambulatory in to triage. Pt reports hx of seizures. Pt awake, alert, appropriate at present.

## 2023-01-05 ENCOUNTER — Other Ambulatory Visit: Payer: Self-pay | Admitting: Internal Medicine

## 2023-01-05 DIAGNOSIS — Z1231 Encounter for screening mammogram for malignant neoplasm of breast: Secondary | ICD-10-CM

## 2023-01-16 ENCOUNTER — Ambulatory Visit (INDEPENDENT_AMBULATORY_CARE_PROVIDER_SITE_OTHER): Payer: Medicaid Other | Admitting: Primary Care

## 2023-01-16 ENCOUNTER — Encounter: Payer: Self-pay | Admitting: Primary Care

## 2023-01-16 VITALS — BP 120/78 | HR 96 | Temp 98.1°F | Ht 61.0 in | Wt 197.0 lb

## 2023-01-16 DIAGNOSIS — R0683 Snoring: Secondary | ICD-10-CM

## 2023-01-16 DIAGNOSIS — G40909 Epilepsy, unspecified, not intractable, without status epilepticus: Secondary | ICD-10-CM | POA: Diagnosis not present

## 2023-01-16 DIAGNOSIS — J398 Other specified diseases of upper respiratory tract: Secondary | ICD-10-CM

## 2023-01-16 DIAGNOSIS — J449 Chronic obstructive pulmonary disease, unspecified: Secondary | ICD-10-CM

## 2023-01-16 DIAGNOSIS — Z9189 Other specified personal risk factors, not elsewhere classified: Secondary | ICD-10-CM | POA: Diagnosis not present

## 2023-01-16 NOTE — Assessment & Plan Note (Signed)
-   Patient has symptoms of loud snoring and wakes up gasping for air. Associated daytime sleepiness. BMI 37. Concern patient has sleep apnea, needs in-lab sleep study due to hx seizure disorder and underlying obstructive lung disease.  Reviewed risks of untreated sleep apnea including cardiac arrhythmias, pulmonary hypertension, diabetes and stroke.  We also discussed treatment options including weight loss, oral appliance, CPAP therapy or referral to ENT for possible surgical options.  Encourage side sleeping position.  Advised against driving experiencing excessive daytime sleepiness fatigue.  Follow-up 1 to 2 weeks after study to review results.

## 2023-01-16 NOTE — Assessment & Plan Note (Signed)
-   Former smoker.  Pulmonary function testing in 2016 showed mild obstructive lung disease.  She was recently admitted for pneumonia.  She was discharged on Symbicort and Spiriva.  Breathing is currently well-controlled on triple therapy.  She has no acute respiratory complaints.  She uses SABA on average 1-2 times per week.  Recommend getting updated pulmonary function testing.
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
## 2023-01-16 NOTE — Assessment & Plan Note (Signed)
-   Needs sleep testing and potentially CPAP

## 2023-01-16 NOTE — Patient Instructions (Addendum)
  Continue Symbicort 185mcg and Spiriva Respimat as directed Keep consistent sleep scheduled (set bedtime and wake time) Take Remeron 2 hours before bedtime  Focus on side sleeping position Work on weight loss efforts  Orders: Pulmonary function testing In-lab sleep study   Follow-up: 4-6 weeks with 1 hour PFT prior with Eustaquio Maize NP

## 2023-01-16 NOTE — Progress Notes (Signed)
Reviewed and agree with assessment/plan.   Chesley Mires, MD [facility] Pulmonary/Critical Care 01/16/2023, 1:51 PM Pager:  [phone]

## 2023-01-16 NOTE — Progress Notes (Signed)
@Patient  ID: [name], female    DOB: 1976/02/26, 47 y.o. Patient presents today for sleep consult, referred by primary care.     Chief Complaint  Patient presents with   Consult    Snoring and gasping for air during night. Sx x several years.    Referring provider: Trey Sailors, PA  HPI: She is a former smoker, quit January 2023. PMH significant for COPD, multifocal pneumonia, tracheobronchial malacia, seizure disorder, obesity.  01/16/2023 Patient presents today for sleep consult. Accompanied by her mother. She has symptoms of loud snoring and gasping. Her sleep can be very restless, she wakes up several times a night. She feels tired when she wakes up in the morning. She falls asleep easily if inactive. Typical bedtime is between 1-2am. She takes Remeron 15mg  at bedtime. It takes about an hour to fall asleep. She takes her medicine at 9pm. She starts her day between 6am-10am. She sleep on her side.   Admitted in January for multifocal pneumonia. CTA suggestive of multifocal pneumonia, pneumatocele and tracheobronchomalacia. Procalcitonin elevated. Blood cultures negative to date. Treated with ceftriaxone x 4 days and discharged on 3 more days of Augmentin.  Received Zithromax 500mg  daily for 3 days. Discharged with Symbicort 160 and continue Spiriva. Last PFTs were in 2016 showing mild obstructive lung disease.   She is back to her baseline breathing wise. She is using Symbicort and Spiriva as directed. She uses Albuterol on average 1-2 times a week, especially during spring/summer. She quit smoking tobacco in January 2023. She still vapes. Needs updated outpatient PFTs.   Sleep questionnaire Symptoms-  loud snoring, waking up gasping for air  Prior sleep study-  none Bedtime- 1-2am  Time to fall asleep- 60 mins Nocturnal awakenings- 1-2 times  Out of bed/start of day- 6am-10am Weight changes- up Do you operate heavy machinery- no Do you currently wear CPAP- no Do  you current wear oxygen- no    No Known Allergies  Immunization History  Administered Date(s) Administered   Influenza,inj,Quad PF,6+ Mos 08/10/2020   PFIZER(Purple Top)SARS-COV-2 Vaccination 03/10/2020, 04/03/2020, 10/03/2020   Tdap 01/12/2015, 04/07/2021    Past Medical History:  Diagnosis Date   Asthma    Carpal tunnel syndrome    CIN III (cervical intraepithelial neoplasia III) 2017   Seen on colposcopy. LEEP actually showed CIN II with negative margins   COPD (chronic obstructive pulmonary disease)    Hemorrhoids    Iron deficiency anemia    Sjogren's syndrome     Tobacco History: Social History   Tobacco Use  Smoking Status Former   Packs/day: 0.50   Years: 37.00   Additional pack years: 0.00   Total pack years: 18.50   Types: Cigarettes   Quit date: 2023   Years since quitting: 1.2   Passive exposure: Past  Smokeless Tobacco Never   Counseling given: Not Answered   Outpatient Medications Prior to Visit  Medication Sig Dispense Refill   albuterol (PROAIR HFA) 108 (90 Base) MCG/ACT inhaler INHALE 1 TO 2 PUFFS INTO LUNGS EVERY 6 HOURS AS NEEDED FOR WHEEZING OR SHORTNESS OF BREATH (Patient taking differently: Inhale 1 puff into the lungs every 6 (six) hours as needed for wheezing or shortness of breath.) 8 g 1   amLODipine (NORVASC) 2.5 MG tablet Take 2.5 mg by mouth daily.     atorvastatin (LIPITOR) 40 MG tablet TAKE 1 TABLET(40 MG) BY MOUTH DAILY (Patient taking differently: Take 40 mg by mouth daily.) 30 tablet 5  budesonide-formoterol (SYMBICORT) 160-4.5 MCG/ACT inhaler Inhale 2 puffs into the lungs in the morning and at bedtime. 10.2 g 1   DULoxetine (CYMBALTA) 60 MG capsule TAKE 1 CAPSULE(60 MG) BY MOUTH DAILY (Patient taking differently: Take 60 mg by mouth daily.) 30 capsule 5   fluticasone (FLONASE) 50 MCG/ACT nasal spray instill 2 sprays into each nostril once daily 16 g 0   gabapentin (NEURONTIN) 400 MG capsule Take 400 mg by mouth 3 (three) times  daily.     lamoTRIgine (LAMICTAL) 25 MG tablet TAKE 2 TABLETS BY MOUTH TWICE DAILY 120 tablet 0   meloxicam (MOBIC) 7.5 MG tablet TAKE 1 TABLET(7.5 MG) BY MOUTH DAILY (Patient taking differently: Take 7.5 mg by mouth daily.) 14 tablet 0   mirtazapine (REMERON) 15 MG tablet TAKE 1 TABLET(15 MG) BY MOUTH AT BEDTIME (Patient taking differently: Take 15 mg by mouth at bedtime.) 30 tablet 2   Multiple Vitamins-Minerals (MULTIVITAMIN WITH MINERALS) tablet Take 1 tablet by mouth daily.     pregabalin (LYRICA) 75 MG capsule Take 75 mg by mouth 3 (three) times daily.     Tiotropium Bromide Monohydrate (SPIRIVA RESPIMAT) 2.5 MCG/ACT AERS Inhale 2 puffs into the lungs daily. 4 g 1   tiZANidine (ZANAFLEX) 2 MG tablet Take 2 mg by mouth daily.     diclofenac (VOLTAREN) 75 MG EC tablet Take 75 mg by mouth 2 (two) times daily as needed for mild pain. (Patient not taking: Reported on 01/16/2023)     No facility-administered medications prior to visit.   Review of Systems  Review of Systems  Constitutional: Negative.   HENT: Negative.    Respiratory: Negative.     Physical Exam  BP 120/78 (BP Location: Right Arm, Patient Position: Sitting, Cuff Size: Large)   Pulse 96   Temp 98.1 F (36.7 C) (Oral)   Ht 5\' 1"  (1.549 m)   Wt 197 lb (89.4 kg)   LMP 12/01/2022   SpO2 99%   BMI 37.22 kg/m  Physical Exam Constitutional:      General: She is not in acute distress.    Appearance: Normal appearance. She is not ill-appearing or toxic-appearing.  HENT:     Head: Normocephalic and atraumatic.  Cardiovascular:     Rate and Rhythm: Normal rate and regular rhythm.  Pulmonary:     Effort: Pulmonary effort is normal.     Breath sounds: Normal breath sounds. No wheezing, rhonchi or rales.  Musculoskeletal:        General: Normal range of motion.  Skin:    General: Skin is warm and dry.  Neurological:     General: No focal deficit present.     Mental Status: She is alert and oriented to person, place, and  time. Mental status is at baseline.  Psychiatric:        Mood and Affect: Mood normal.        Behavior: Behavior normal.        Thought Content: Thought content normal.        Judgment: Judgment normal.      Lab Results:  CBC    Component Value Date/Time   WBC 4.8 12/26/2022 1107   RBC 4.16 12/26/2022 1107   HGB 12.0 12/26/2022 1107   HGB 13.3 01/28/2022 0633   HCT 38.4 12/26/2022 1107   HCT 41.8 01/28/2022 0633   PLT 233 12/26/2022 1107   PLT 283 11/16/2016 1645   MCV 92.3 12/26/2022 1107   MCV 89 01/28/2022 0633   MCH  28.8 12/26/2022 1107   MCHC 31.3 12/26/2022 1107   RDW 14.6 12/26/2022 1107   RDW 14.2 01/28/2022 0633   LYMPHSABS 0.5 (L) 11/02/2022 0645   LYMPHSABS 1.3 01/28/2022 0633   MONOABS 0.1 11/02/2022 0645   EOSABS 0.1 11/02/2022 0645   EOSABS 0.3 01/28/2022 0633   BASOSABS 0.0 11/02/2022 0645   BASOSABS 0.1 01/28/2022 0633    BMET    Component Value Date/Time   NA 138 12/26/2022 1107   NA 139 12/23/2020 1122   K 3.9 12/26/2022 1107   CL 102 12/26/2022 1107   CO2 28 12/26/2022 1107   GLUCOSE 116 (H) 12/26/2022 1107   BUN 13 12/26/2022 1107   BUN 14 12/23/2020 1122   CREATININE 0.63 12/26/2022 1107   CALCIUM 9.0 12/26/2022 1107   GFRNONAA >60 12/26/2022 1107   GFRAA 137 11/16/2016 1645    BNP    Component Value Date/Time   BNP 80.4 11/02/2022 0645    ProBNP No results found for: "PROBNP"  Imaging: CT Head Wo Contrast  Result Date: 12/26/2022 CLINICAL DATA:  Loss of consciousness with fall to the ground. EXAM: CT HEAD WITHOUT CONTRAST TECHNIQUE: Contiguous axial images were obtained from the base of the skull through the vertex without intravenous contrast. RADIATION DOSE REDUCTION: This exam was performed according to the departmental dose-optimization program which includes automated exposure control, adjustment of the mA and/or kV according to patient size and/or use of iterative reconstruction technique. COMPARISON:  MRI 02/17/2022  FINDINGS: Brain: The brain shows a normal appearance without evidence of malformation, atrophy, old or acute small or large vessel infarction, mass lesion, hemorrhage, hydrocephalus or extra-axial collection. Vascular: No hyperdense vessel. No evidence of atherosclerotic calcification. Skull: Normal.  No traumatic finding.  No focal bone lesion. Sinuses/Orbits: Sinuses are clear. Orbits appear normal. Mastoids are clear. Other: None significant IMPRESSION: Normal head CT. Electronically Signed   By: Nelson Chimes M.D.   On: 12/26/2022 14:20   DG Tibia/Fibula Right  Result Date: 12/26/2022 CLINICAL DATA:  Fall leg pain EXAM: RIGHT TIBIA AND FIBULA - 2 VIEW COMPARISON:  12/26/2022 FINDINGS: There is no evidence of fracture or other focal bone lesions. Soft tissues are unremarkable. IMPRESSION: Negative. Electronically Signed   By: Jerilynn Mages.  Shick M.D.   On: 12/26/2022 12:06   DG Tibia/Fibula Left  Result Date: 12/26/2022 CLINICAL DATA:  Fall, lower leg pain EXAM: LEFT TIBIA AND FIBULA - 2 VIEW COMPARISON:  12/26/2022 FINDINGS: There is no evidence of fracture or other focal bone lesions. Soft tissues are unremarkable. IMPRESSION: No acute finding Electronically Signed   By: Jerilynn Mages.  Shick M.D.   On: 12/26/2022 12:05   DG Pelvis 1-2 Views  Result Date: 12/26/2022 CLINICAL DATA:  Recent fall hip and lower leg pain EXAM: PELVIS - 1-2 VIEW COMPARISON:  12/26/2022 FINDINGS: There is no evidence of pelvic fracture or diastasis. No pelvic bone lesions are seen. Bony pelvis and hips appear symmetric and intact. Nonobstructive bowel gas pattern. IMPRESSION: No acute finding Electronically Signed   By: Jerilynn Mages.  Shick M.D.   On: 12/26/2022 12:04     Assessment & Plan:   Loud snoring - Patient has symptoms of loud snoring and wakes up gasping for air. Associated daytime sleepiness. BMI 37. Concern patient has sleep apnea, needs in-lab sleep study due to hx seizure disorder and underlying obstructive lung disease.  Reviewed risks  of untreated sleep apnea including cardiac arrhythmias, pulmonary hypertension, diabetes and stroke.  We also discussed treatment options including weight loss,  oral appliance, CPAP therapy or referral to ENT for possible surgical options.  Encourage side sleeping position.  Advised against driving experiencing excessive daytime sleepiness fatigue.  Follow-up 1 to 2 weeks after study to review results.  COPD (chronic obstructive pulmonary disease) ([place]) - Former smoker.  Pulmonary function testing in 2016 showed mild obstructive lung disease.  She was recently admitted for pneumonia.  She was discharged on Symbicort and Spiriva.  Breathing is currently well-controlled on triple therapy.  She has no acute respiratory complaints.  She uses SABA on average 1-2 times per week.  Recommend getting updated pulmonary function testing.  Tracheobronchomalacia - Needs sleep testing and potentially CPAP      Martyn Ehrich, NP 01/16/2023

## 2023-01-17 ENCOUNTER — Other Ambulatory Visit (INDEPENDENT_AMBULATORY_CARE_PROVIDER_SITE_OTHER): Payer: Medicaid Other

## 2023-01-17 ENCOUNTER — Encounter: Payer: Self-pay | Admitting: Neurology

## 2023-01-17 ENCOUNTER — Ambulatory Visit: Payer: Medicaid Other | Admitting: Neurology

## 2023-01-17 VITALS — BP 130/79 | HR 99 | Ht 61.0 in | Wt 197.8 lb

## 2023-01-17 DIAGNOSIS — G40909 Epilepsy, unspecified, not intractable, without status epilepticus: Secondary | ICD-10-CM | POA: Diagnosis not present

## 2023-01-17 DIAGNOSIS — Z79899 Other long term (current) drug therapy: Secondary | ICD-10-CM | POA: Diagnosis not present

## 2023-01-17 MED ORDER — LAMOTRIGINE 25 MG PO TABS: ORAL_TABLET | ORAL | 11 refills | Status: DC

## 2023-01-17 NOTE — Patient Instructions (Signed)
Continue lamotrigine 50mg  twice daily.  Check lamotrigine level Follow up in one year  3. Avoid activities in which a seizure would cause danger to yourself or to others.  Don't operate dangerous machinery, swim alone, or climb in high or dangerous places, such as on ladders, roofs, or girders.  Do not drive unless your doctor says you may.  4. If you have any warning that you may have a seizure, lay down in a safe place where you can't hurt yourself.    5.  No driving for 6 months from last seizure, as per [facility].   Please refer to the following link on the [place] website for more information: http://[email or web address]/[email or web address]/driving/drivingu.cfm   6.  Maintain good sleep hygiene.  7.  Notify your neurology if you are planning pregnancy or if you become pregnant.  8.  Contact your doctor if you have any problems that may be related to the medicine you are taking.  9.  Call 911 and bring the patient back to the ED if:        A.  The seizure lasts longer than 5 minutes.       B.  The patient doesn't awaken shortly after the seizure  C.  The patient has new problems such as difficulty seeing, speaking or moving  D.  The patient was injured during the seizure  E.  The patient has a temperature over 102 F (39C)  F.  The patient vomited and now is having trouble breathing

## 2023-01-19 ENCOUNTER — Ambulatory Visit: Payer: Medicaid Other

## 2023-01-19 LAB — LAMOTRIGINE LEVEL: Lamotrigine Lvl: <0.5 ug/mL — ABNORMAL LOW (ref 2.5–15.0)

## 2023-01-25 ENCOUNTER — Inpatient Hospital Stay: Admission: RE | Admit: 2023-01-25 | Payer: Medicaid Other | Source: Ambulatory Visit

## 2023-03-09 ENCOUNTER — Telehealth: Payer: Self-pay | Admitting: Primary Care

## 2023-03-09 NOTE — Telephone Encounter (Signed)
Authorization for in lab sleep study has been denied by [employer] from payor:  Here are the policy requirements your request did not meet: We cannot approve the request for the in-lab sleep study (95810, polysomnography) for you. This is denied as not medically needed. We see that you have trouble sleeping. To approve this test, certain conditions must be met. Review of the records do not show you meet these conditions (You are over 46 years old. You do not have moderate/ severe lung disease (FEV1/FVC less than or equal to 0.7). You do not have moderate/ severe heart disease (NYHA class III or IV). You do not have dangerous heartbeats (ventricular fibrillation or ventricular tachycardia). You do not have a problem following simple instructions. You do not have other sleep disorders (such as narcolepsy, idiopathic hypersomnia, parasomnia etc). You have not had trouble doing a prior sleep study. You are not on extra oxygen. You have not had a stroke within the last thirty days. You do not use chronic opiate narcotic drugs). You may qualify for a home sleep study.   Please let me know if you'd like to submit an appeal or change order to HST

## 2023-03-09 NOTE — Telephone Encounter (Signed)
She has COPD but PFTs are 47 years old and at that time only showed mild obstruction. She is scheduled to have updated PFTs tomorrow and OV with me after, so if lung function is worse I will want to re-submit.

## 2023-03-10 ENCOUNTER — Other Ambulatory Visit: Payer: Self-pay | Admitting: Primary Care

## 2023-03-10 ENCOUNTER — Encounter: Payer: Self-pay | Admitting: Primary Care

## 2023-03-10 ENCOUNTER — Ambulatory Visit: Payer: Medicaid Other | Admitting: Primary Care

## 2023-03-10 DIAGNOSIS — R0683 Snoring: Secondary | ICD-10-CM

## 2023-03-10 NOTE — Progress Notes (Deleted)
@Patient  ID: [name], female    DOB: 17-May-1976, 47 y.o.   MRN: [id number]  No chief complaint on file.   Referring provider: Norm Salt, PA  HPI:       She has COPD but PFTs are 47 years old and at that time only showed mild obstruction. She is scheduled to have updated PFTs tomorrow and OV with me after, so if lung function is worse I will want to re-submit.    No Known Allergies  Immunization History  Administered Date(s) Administered   Influenza,inj,Quad PF,6+ Mos 08/10/2020   PFIZER(Purple Top)SARS-COV-2 Vaccination 03/10/2020, 04/03/2020, 10/03/2020   Tdap 01/12/2015, 04/07/2021    Past Medical History:  Diagnosis Date   Asthma    Carpal tunnel syndrome    CIN III (cervical intraepithelial neoplasia III) 2017   Seen on colposcopy. LEEP actually showed CIN II with negative margins   COPD (chronic obstructive pulmonary disease) ([facility])    Hemorrhoids    Iron deficiency anemia    Sjogren's syndrome ([facility])     Tobacco History: Social History   Tobacco Use  Smoking Status Former   Packs/day: 0.50   Years: 37.00   Additional pack years: 0.00   Total pack years: 18.50   Types: Cigarettes   Quit date: 2023   Years since quitting: 1.3   Passive exposure: Past  Smokeless Tobacco Never   Counseling given: Not Answered   Outpatient Medications Prior to Visit  Medication Sig Dispense Refill   albuterol (PROAIR HFA) 108 (90 Base) MCG/ACT inhaler INHALE 1 TO 2 PUFFS INTO LUNGS EVERY 6 HOURS AS NEEDED FOR WHEEZING OR SHORTNESS OF BREATH (Patient taking differently: Inhale 1 puff into the lungs every 6 (six) hours as needed for wheezing or shortness of breath.) 8 g 1   amLODipine (NORVASC) 2.5 MG tablet Take 2.5 mg by mouth daily.     atorvastatin (LIPITOR) 40 MG tablet TAKE 1 TABLET(40 MG) BY MOUTH DAILY (Patient taking differently: Take 40 mg by mouth daily.) 30 tablet 5   budesonide-formoterol (SYMBICORT) 160-4.5 MCG/ACT inhaler Inhale 2 puffs into  the lungs in the morning and at bedtime. 10.2 g 1   diclofenac (VOLTAREN) 75 MG EC tablet Take 75 mg by mouth 2 (two) times daily as needed for mild pain.     DULoxetine (CYMBALTA) 60 MG capsule TAKE 1 CAPSULE(60 MG) BY MOUTH DAILY (Patient taking differently: Take 60 mg by mouth daily.) 30 capsule 5   fluticasone (FLONASE) 50 MCG/ACT nasal spray instill 2 sprays into each nostril once daily 16 g 0   gabapentin (NEURONTIN) 400 MG capsule Take 400 mg by mouth 3 (three) times daily.     lamoTRIgine (LAMICTAL) 25 MG tablet TAKE 2 TABLETS BY MOUTH TWICE DAILY 120 tablet 11   meloxicam (MOBIC) 7.5 MG tablet TAKE 1 TABLET(7.5 MG) BY MOUTH DAILY (Patient taking differently: Take 7.5 mg by mouth daily.) 14 tablet 0   mirtazapine (REMERON) 15 MG tablet TAKE 1 TABLET(15 MG) BY MOUTH AT BEDTIME (Patient taking differently: Take 15 mg by mouth at bedtime.) 30 tablet 2   Multiple Vitamins-Minerals (MULTIVITAMIN WITH MINERALS) tablet Take 1 tablet by mouth daily.     pregabalin (LYRICA) 75 MG capsule Take 75 mg by mouth 3 (three) times daily.     Tiotropium Bromide Monohydrate (SPIRIVA RESPIMAT) 2.5 MCG/ACT AERS Inhale 2 puffs into the lungs daily. 4 g 1   tiZANidine (ZANAFLEX) 2 MG tablet Take 2 mg by mouth daily.  No facility-administered medications prior to visit.      Review of Systems  Review of Systems   Physical Exam  There were no vitals taken for this visit. Physical Exam   Lab Results:  CBC    Component Value Date/Time   WBC 4.8 12/26/2022 1107   RBC 4.16 12/26/2022 1107   HGB 12.0 12/26/2022 1107   HGB 13.3 01/28/2022 0633   HCT 38.4 12/26/2022 1107   HCT 41.8 01/28/2022 0633   PLT 233 12/26/2022 1107   PLT 283 11/16/2016 1645   MCV 92.3 12/26/2022 1107   MCV 89 01/28/2022 0633   MCH 28.8 12/26/2022 1107   MCHC 31.3 12/26/2022 1107   RDW 14.6 12/26/2022 1107   RDW 14.2 01/28/2022 0633   LYMPHSABS 0.5 (L) 11/02/2022 0645   LYMPHSABS 1.3 01/28/2022 0633   MONOABS 0.1  11/02/2022 0645   EOSABS 0.1 11/02/2022 0645   EOSABS 0.3 01/28/2022 0633   BASOSABS 0.0 11/02/2022 0645   BASOSABS 0.1 01/28/2022 0633    BMET    Component Value Date/Time   NA 138 12/26/2022 1107   NA 139 12/23/2020 1122   K 3.9 12/26/2022 1107   CL 102 12/26/2022 1107   CO2 28 12/26/2022 1107   GLUCOSE 116 (H) 12/26/2022 1107   BUN 13 12/26/2022 1107   BUN 14 12/23/2020 1122   CREATININE 0.63 12/26/2022 1107   CALCIUM 9.0 12/26/2022 1107   GFRNONAA >60 12/26/2022 1107   GFRAA 137 11/16/2016 1645    BNP    Component Value Date/Time   BNP 80.4 11/02/2022 0645    ProBNP No results found for: "PROBNP"  Imaging: No results found.   Assessment & Plan:   No problem-specific Assessment & Plan notes found for this encounter.     Glenford Bayley, NP 03/10/2023

## 2023-03-10 NOTE — Telephone Encounter (Signed)
Ok. Ill change to home sleep study

## 2023-03-10 NOTE — Progress Notes (Signed)
In-sleep study denies. Patient no showed for PFT Changing to home sleep study

## 2023-03-20 ENCOUNTER — Telehealth: Payer: Self-pay | Admitting: Primary Care

## 2023-03-21 NOTE — Telephone Encounter (Signed)
Clinicals have been attached to the [employer] authorization via portal.

## 2023-04-04 NOTE — Telephone Encounter (Signed)
I changed this to a home sleep study Re: loud snoring

## 2023-04-18 NOTE — Telephone Encounter (Signed)
Any update on getting home sleep study approved?

## 2023-04-26 ENCOUNTER — Ambulatory Visit: Payer: Medicaid Other | Admitting: Primary Care

## 2023-05-03 ENCOUNTER — Telehealth: Payer: Self-pay

## 2023-05-03 NOTE — Telephone Encounter (Signed)
Surgical clarence received from [facility]

## 2023-05-04 ENCOUNTER — Telehealth: Payer: Self-pay | Admitting: Primary Care

## 2023-05-04 NOTE — Telephone Encounter (Signed)
Nope. She was seen for sleep consult and has not had follow-up. Sleep study and PFTs have not appeared to be done

## 2023-05-04 NOTE — Telephone Encounter (Signed)
Fax received from Dr. Shelle Iron with [facility] to perform a Lumbar Decompression on patient.  Patient needs surgery clearance. Surgery is Pending. Patient was seen on 01/16/23. Office protocol is a risk assessment can be sent to surgeon if patient has been seen in 60 days or less.   Sending to [facility] for risk assessment or recommendations if patient needs to be seen in office prior to surgical procedure.   Beth can patient be cleared for procedure based on last apt? No available OV till end of August

## 2023-05-04 NOTE — Telephone Encounter (Signed)
ATC X1-received busy signal Patient needs office visit with an APP for surgical clearance. Please schedule when patient calls back

## 2023-05-08 NOTE — Telephone Encounter (Signed)
ATC X1 unable to LVM-busy signal Sending Surgical clearance request back. As patient has been contacted several times. Patient will need to contact office for visit if she plans to move forward with surgery

## 2023-05-08 NOTE — Telephone Encounter (Signed)
ATC X1 unable to LVM-received busy signal. Will try again later

## 2023-05-12 NOTE — Telephone Encounter (Signed)
NEW CASE ID: [id number]  Awaiting response, was able to submit new case for sleep study since appeal was dismissed due to pt not responding to consent request from insurance company.   Will update once a new status is available. Uploaded all notes and provided additional notes since both cases were denied

## 2023-05-12 NOTE — Telephone Encounter (Signed)
Thanks

## 2023-05-12 NOTE — Telephone Encounter (Signed)
Appeal was dismissed for HST as pt did not respond to consent that was sent from insurance company.   Was able to re submit for authorization on the HST only, awaiting response from [employer]   Uploaded all documentation from OV and notes of PFT. Included provider notes that they denied both in lab and hst restricting [place] from providing this patient with proper treatment for his sleep apnea.   Will provide an update once one is available.

## 2023-05-19 NOTE — Telephone Encounter (Signed)
I asked [name] to please not bill this pt for HST CPT CODE: G0398 which was denied by [employer]  HST CPT Code 16109 does not require pa. This patient has been sent to be scheduled in office for HST.   Nothing further needed.

## 2023-05-22 ENCOUNTER — Telehealth: Payer: Self-pay | Admitting: Primary Care

## 2023-05-22 NOTE — Telephone Encounter (Signed)
See Notes

## 2023-07-18 ENCOUNTER — Telehealth: Payer: Self-pay | Admitting: Primary Care

## 2023-07-18 NOTE — Telephone Encounter (Signed)
Pt must schedule appt with [place] to be cleared  She has not been seen since 01/16/23  See phone note dated 05/04/23   Called the pt and there was no answer- line rings multiple times and then turns to busy signal  Will try back later

## 2023-07-18 NOTE — Telephone Encounter (Signed)
Patient needs surgical clearance for her back surgery. [facility] should have sent over a form for surgical clearance [phone]. Patient call back number [phone]

## 2023-08-04 NOTE — Telephone Encounter (Addendum)
Called pt again and still unable to reach her or to leave her a Programmer, multimedia
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
## 2024-01-17 ENCOUNTER — Ambulatory Visit (INDEPENDENT_AMBULATORY_CARE_PROVIDER_SITE_OTHER): Payer: Medicaid Other | Admitting: Neurology

## 2024-01-17 ENCOUNTER — Encounter: Payer: Self-pay | Admitting: Neurology

## 2024-01-17 ENCOUNTER — Other Ambulatory Visit

## 2024-01-17 VITALS — BP 124/75 | HR 102 | Ht 62.0 in | Wt 210.0 lb

## 2024-01-17 DIAGNOSIS — R002 Palpitations: Secondary | ICD-10-CM

## 2024-01-17 DIAGNOSIS — R55 Syncope and collapse: Secondary | ICD-10-CM

## 2024-01-17 MED ORDER — LAMOTRIGINE 100 MG PO TABS: 100.0000 mg | ORAL_TABLET | Freq: Two times a day (BID) | ORAL | 5 refills | Status: AC
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
## 2024-01-21 LAB — LAMOTRIGINE LEVEL: Lamotrigine Lvl: 2 ug/mL — ABNORMAL LOW (ref 2.5–15.0)

## 2024-01-21 LAB — COMPREHENSIVE METABOLIC PANEL WITH GFR
AG Ratio: 2 (calc) (ref 1.0–2.5)
ALT: 42 U/L — ABNORMAL HIGH (ref 6–29)
AST: 28 U/L (ref 10–35)
Albumin: 4.8 g/dL (ref 3.6–5.1)
Alkaline phosphatase (APISO): 109 U/L (ref 31–125)
BUN: 25 mg/dL (ref 7–25)
CO2: 26 mmol/L (ref 20–32)
Calcium: 9.3 mg/dL (ref 8.6–10.2)
Chloride: 103 mmol/L (ref 98–110)
Creat: 0.94 mg/dL (ref 0.50–0.99)
Globulin: 2.4 g/dL (ref 1.9–3.7)
Glucose, Bld: 114 mg/dL — ABNORMAL HIGH (ref 65–99)
Potassium: 4.4 mmol/L (ref 3.5–5.3)
Sodium: 139 mmol/L (ref 135–146)
Total Bilirubin: 0.4 mg/dL (ref 0.2–1.2)
Total Protein: 7.2 g/dL (ref 6.1–8.1)
eGFR: 76 mL/min/{1.73_m2} (ref >=60)

## 2024-01-21 LAB — CBC
HCT: 45.9 % — ABNORMAL HIGH (ref 35.0–45.0)
Hemoglobin: 14.9 g/dL (ref 11.7–15.5)
MCH: 29.6 pg (ref 27.0–33.0)
MCHC: 32.5 g/dL (ref 32.0–36.0)
MCV: 91.1 fL (ref 80.0–100.0)
MPV: 11.7 fL (ref 7.5–12.5)
Platelets: 286 10*3/uL (ref 140–400)
RBC: 5.04 10*6/uL (ref 3.80–5.10)
RDW: 13.2 % (ref 11.0–15.0)
WBC: 6.5 10*3/uL (ref 3.8–10.8)

## 2024-01-24 ENCOUNTER — Other Ambulatory Visit: Payer: Self-pay | Admitting: Neurology

## 2024-03-22 ENCOUNTER — Other Ambulatory Visit: Payer: Self-pay | Admitting: Physician Assistant

## 2024-03-22 DIAGNOSIS — Z1231 Encounter for screening mammogram for malignant neoplasm of breast: Secondary | ICD-10-CM

## 2024-03-27 ENCOUNTER — Ambulatory Visit

## 2024-04-16 ENCOUNTER — Ambulatory Visit

## 2024-04-30 ENCOUNTER — Ambulatory Visit: Attending: Cardiovascular Disease | Admitting: Cardiovascular Disease

## 2024-05-01 ENCOUNTER — Encounter: Payer: Self-pay | Admitting: Cardiovascular Disease

## 2024-05-01 NOTE — Progress Notes (Unsigned)
 Cardiology Office Note   Date:  05/01/2024   ID:  [name], DOB 04-29-1976, MRN [id number]  PCP:  Rosalea Rosina SAILOR, PA  Cardiologist:   None Referring:  ***  No chief complaint on file.     History of Present Illness: [name] is a 48 y.o. female who presents for evaluation of syncope and palpitations.  ***    She was seen by neurology.  EEG was unremarkable.  MRI was unremarkable  ***   Past Medical History:  Diagnosis Date   Asthma    Carpal tunnel syndrome    CIN III (cervical intraepithelial neoplasia III) 2017   Seen on colposcopy. LEEP actually showed CIN II with negative margins   COPD (chronic obstructive pulmonary disease) (HCC)    Hemorrhoids    Iron deficiency anemia    Sjogren's syndrome (HCC)     Past Surgical History:  Procedure Laterality Date   CESAREAN SECTION     Tubal ligation       Current Outpatient Medications  Medication Sig Dispense Refill   albuterol  (PROAIR  HFA) 108 (90 Base) MCG/ACT inhaler INHALE 1 TO 2 PUFFS INTO LUNGS EVERY 6 HOURS AS NEEDED FOR WHEEZING OR SHORTNESS OF BREATH (Patient taking differently: Inhale 1 puff into the lungs every 6 (six) hours as needed for wheezing or shortness of breath.) 8 g 1   amLODipine (NORVASC) 5 MG tablet Take 5 mg by mouth daily.     atorvastatin  (LIPITOR) 40 MG tablet TAKE 1 TABLET(40 MG) BY MOUTH DAILY (Patient taking differently: Take 40 mg by mouth daily.) 30 tablet 5   budesonide-formoterol  (SYMBICORT ) 160-4.5 MCG/ACT inhaler Inhale 2 puffs into the lungs in the morning and at bedtime. (Patient not taking: Reported on 01/17/2024) 10.2 g 1   diclofenac  (VOLTAREN) 75 MG EC tablet Take 75 mg by mouth 2 (two) times daily as needed for mild pain. (Patient not taking: Reported on 01/17/2024)     DULoxetine  (CYMBALTA ) 60 MG capsule TAKE 1 CAPSULE(60 MG) BY MOUTH DAILY (Patient taking differently: Take 60 mg by mouth daily.) 30 capsule 5   fluticasone  (FLONASE ) 50 MCG/ACT nasal spray instill 2  sprays into each nostril once daily 16 g 0   gabapentin  (NEURONTIN ) 600 MG tablet Take 600 mg by mouth 3 (three) times daily.     lamoTRIgine  (LAMICTAL ) 100 MG tablet Take 1 tablet (100 mg total) by mouth 2 (two) times daily. 60 tablet 5   meloxicam  (MOBIC ) 15 MG tablet Take 15 mg by mouth daily as needed.     mirtazapine  (REMERON ) 15 MG tablet TAKE 1 TABLET(15 MG) BY MOUTH AT BEDTIME (Patient taking differently: Take 15 mg by mouth at bedtime.) 30 tablet 2   Multiple Vitamins-Minerals (MULTIVITAMIN WITH MINERALS) tablet Take 1 tablet by mouth daily.     oxyCODONE-acetaminophen  (PERCOCET/ROXICET) 5-325 MG tablet Take 1 tablet by mouth 3 (three) times daily.     pregabalin (LYRICA) 75 MG capsule Take 75 mg by mouth 3 (three) times daily. (Patient not taking: Reported on 01/17/2024)     Tiotropium Bromide  Monohydrate (SPIRIVA  RESPIMAT) 2.5 MCG/ACT AERS Inhale 2 puffs into the lungs daily. (Patient taking differently: Inhale 2 puffs into the lungs 2 (two) times daily.) 4 g 1   tiZANidine  (ZANAFLEX ) 2 MG tablet Take 4 mg by mouth daily.     tiZANidine  (ZANAFLEX ) 4 MG tablet Take 4-8 mg by mouth every 8 (eight) hours as needed.     No current facility-administered medications for  this visit.    Allergies:   Patient has no known allergies.    Social History:  The patient  reports that she quit smoking about 2 years ago. Her smoking use included cigarettes. She started smoking about 39 years ago. She has a 18.5 pack-year smoking history. She has been exposed to tobacco smoke. She has never used smokeless tobacco. She reports current alcohol use. She reports that she does not use drugs.   Family History:  The patient's ***family history includes Diabetes in her father and mother; Migraines in her mother; Neuropathy in her mother.    ROS:  Please see the history of present illness.   Otherwise, review of systems are positive for {NONE DEFAULTED:18576}.   All other systems are reviewed and negative.     PHYSICAL EXAM: VS:  There were no vitals taken for this visit. , BMI There is no height or weight on file to calculate BMI. GENERAL:  Well appearing HEENT:  Pupils equal round and reactive, fundi not visualized, oral mucosa unremarkable NECK:  No jugular venous distention, waveform within normal limits, carotid upstroke brisk and symmetric, no bruits, no thyromegaly LYMPHATICS:  No cervical, inguinal adenopathy LUNGS:  Clear to auscultation bilaterally BACK:  No CVA tenderness CHEST:  Unremarkable HEART:  PMI not displaced or sustained,S1 and S2 within normal limits, no S3, no S4, no clicks, no rubs, *** murmurs ABD:  Flat, positive bowel sounds normal in frequency in pitch, no bruits, no rebound, no guarding, no midline pulsatile mass, no hepatomegaly, no splenomegaly EXT:  2 plus pulses throughout, no edema, no cyanosis no clubbing SKIN:  No rashes no nodules NEURO:  Cranial nerves II through XII grossly intact, motor grossly intact throughout PSYCH:  Cognitively intact, oriented to person place and time    EKG:        Recent Labs: 01/17/2024: ALT 42; BUN 25; Creat 0.94; Hemoglobin 14.9; Platelets 286; Potassium 4.4; Sodium 139    Lipid Panel    Component Value Date/Time   CHOL 293 (H) 12/23/2020 1122   TRIG 409 (H) 12/23/2020 1122   HDL 34 (L) 12/23/2020 1122   CHOLHDL 8.6 (H) 12/23/2020 1122   LDLCALC 178 (H) 12/23/2020 1122      Wt Readings from Last 3 Encounters:  01/17/24 210 lb (95.3 kg)  01/17/23 197 lb 12.8 oz (89.7 kg)  01/16/23 197 lb (89.4 kg)      Other studies Reviewed: Additional studies/ records that were reviewed today include: ***. Review of the above records demonstrates:  Please see elsewhere in the note.  ***   ASSESSMENT AND PLAN:  Syncope:  ***  HTN:  ***    Current medicines are reviewed at length with the patient today.  The patient {ACTIONS; HAS/DOES NOT HAVE:19233} concerns regarding medicines.  The following changes have been  made:  {PLAN; NO CHANGE:13088:s}  Labs/ tests ordered today include: *** No orders of the defined types were placed in this encounter.    Disposition:   FU with ***    Signed, Lynwood Schilling, MD  05/01/2024 6:47 PM    [facility]

## 2024-05-02 ENCOUNTER — Encounter: Payer: Self-pay | Admitting: Cardiology

## 2024-05-02 ENCOUNTER — Encounter: Payer: Self-pay | Admitting: *Deleted

## 2024-05-02 ENCOUNTER — Ambulatory Visit: Attending: Cardiology | Admitting: Cardiology

## 2024-05-02 VITALS — BP 112/75 | HR 100 | Resp 16 | Ht 62.0 in | Wt 201.6 lb

## 2024-05-02 DIAGNOSIS — R002 Palpitations: Secondary | ICD-10-CM | POA: Diagnosis present

## 2024-05-02 DIAGNOSIS — R55 Syncope and collapse: Secondary | ICD-10-CM | POA: Insufficient documentation

## 2024-05-02 NOTE — Patient Instructions (Signed)
 Medication Instructions:  Your physician recommends that you continue on your current medications as directed. Please refer to the Current Medication list given to you today.  *If you need a refill on your cardiac medications before your next appointment, please call your pharmacy*  Lab Work: TSH today If you have labs (blood work) drawn today and your tests are completely normal, you will receive your results only by: MyChart Message (if you have MyChart) OR A paper copy in the mail If you have any lab test that is abnormal or we need to change your treatment, we will call you to review the results.  Testing/Procedures: 4 Week Event Monitor Your physician has recommended that you wear an event monitor. Event monitors are medical devices that record the heart's electrical activity. Doctors most often us  these monitors to diagnose arrhythmias. Arrhythmias are problems with the speed or rhythm of the heartbeat. The monitor is a small, portable device. You can wear one while you do your normal daily activities. This is usually used to diagnose what is causing palpitations/syncope (passing out).   Follow-Up: At [facility], you and your health needs are our priority.  As part of our continuing mission to provide you with exceptional heart care, our providers are all part of one team.  This team includes your primary Cardiologist (physician) and Advanced Practice Providers or APPs (Physician Assistants and Nurse Practitioners) who all work together to provide you with the care you need, when you need it.  Your next appointment:   As needed  Provider:   [place], MD  We recommend signing up for the patient portal called MyChart.  Sign up information is provided on this After Visit Summary.  MyChart is used to connect with patients for Virtual Visits (Telemedicine).  Patients are able to view lab/test results, encounter notes, upcoming appointments, etc.  Non-urgent messages can be sent  to your provider as well.   To learn more about what you can do with MyChart, go to [email or web address].

## 2024-05-02 NOTE — Progress Notes (Signed)
 Patient enrolled for [employer] to ship a 30 day cardiac event monitor to her address on file.  Letter with instructions mailed to patient.

## 2024-05-03 ENCOUNTER — Ambulatory Visit: Payer: Self-pay | Admitting: Cardiology

## 2024-05-03 LAB — TSH: TSH: 1.28 u[IU]/mL (ref 0.450–4.500)
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
## 2024-07-31 ENCOUNTER — Ambulatory Visit: Admitting: Neurology
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
## 2024-08-06 ENCOUNTER — Encounter: Payer: Self-pay | Admitting: Neurology

## 2024-08-06 ENCOUNTER — Ambulatory Visit: Admitting: Neurology

## 2024-08-07 ENCOUNTER — Telehealth: Payer: Self-pay | Admitting: Neurology

## 2024-08-07 NOTE — Telephone Encounter (Signed)
 We are writing to inform you that [facility], including all providers within this practice, will no longer be able to provide medical care to you.  This decision is the result of repeated missed appointments without adequate notice, which has ditrupted our ability to provide timely and effective care to all patients. 08/06/24

## 2024-09-05 ENCOUNTER — Other Ambulatory Visit: Payer: Self-pay | Admitting: Neurology
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
## 2024-09-16 ENCOUNTER — Ambulatory Visit: Admitting: Neurology

## 2024-09-18 ENCOUNTER — Other Ambulatory Visit: Payer: Self-pay | Admitting: Neurology

## 2024-11-20 ENCOUNTER — Ambulatory Visit: Admitting: Neurology
